# Patient Record
Sex: Female | Born: 1955 | Race: Black or African American | Hispanic: No | Marital: Married | Smoking: Former smoker
Health system: Southern US, Community
[De-identification: ages and names within clinical notes are randomized; demographics above are authoritative.]

## PROBLEM LIST (undated history)

## (undated) DIAGNOSIS — D219 Benign neoplasm of connective and other soft tissue, unspecified: Secondary | ICD-10-CM

## (undated) DIAGNOSIS — I10 Essential (primary) hypertension: Secondary | ICD-10-CM

## (undated) DIAGNOSIS — J329 Chronic sinusitis, unspecified: Secondary | ICD-10-CM

## (undated) DIAGNOSIS — K219 Gastro-esophageal reflux disease without esophagitis: Secondary | ICD-10-CM

## (undated) DIAGNOSIS — C801 Malignant (primary) neoplasm, unspecified: Secondary | ICD-10-CM

## (undated) HISTORY — PX: MYOMECTOMY: SHX85

---

## 1998-05-08 ENCOUNTER — Ambulatory Visit (HOSPITAL_COMMUNITY): Admission: RE | Admit: 1998-05-08 | Discharge: 1998-05-08 | Payer: Self-pay | Admitting: Obstetrics

## 1998-05-14 ENCOUNTER — Inpatient Hospital Stay (HOSPITAL_COMMUNITY): Admission: RE | Admit: 1998-05-14 | Discharge: 1998-05-17 | Payer: Self-pay | Admitting: Obstetrics

## 2003-01-09 ENCOUNTER — Emergency Department (HOSPITAL_COMMUNITY): Admission: EM | Admit: 2003-01-09 | Discharge: 2003-01-09 | Payer: Self-pay | Admitting: Emergency Medicine

## 2008-02-14 ENCOUNTER — Emergency Department (HOSPITAL_COMMUNITY): Admission: EM | Admit: 2008-02-14 | Discharge: 2008-02-14 | Payer: Self-pay | Admitting: Emergency Medicine

## 2016-06-10 ENCOUNTER — Ambulatory Visit (HOSPITAL_COMMUNITY)
Admission: EM | Admit: 2016-06-10 | Discharge: 2016-06-10 | Disposition: A | Discharge status: Home / Self Care | Payer: Self-pay | Attending: Internal Medicine | Admitting: Internal Medicine

## 2016-06-10 ENCOUNTER — Encounter (HOSPITAL_COMMUNITY): Payer: Self-pay | Admitting: Emergency Medicine

## 2016-06-10 DIAGNOSIS — R42 Dizziness and giddiness: Secondary | ICD-10-CM | POA: Insufficient documentation

## 2016-06-10 DIAGNOSIS — R030 Elevated blood-pressure reading, without diagnosis of hypertension: Secondary | ICD-10-CM

## 2016-06-10 LAB — CBC WITH DIFFERENTIAL/PLATELET
BASOS ABS: 0 10*3/uL (ref 0.0–0.1)
BASOS PCT: 0 %
EOS PCT: 2 %
Eosinophils Absolute: 0.2 10*3/uL (ref 0.0–0.7)
HEMATOCRIT: 41.3 % (ref 36.0–46.0)
Hemoglobin: 12.8 g/dL (ref 12.0–15.0)
Lymphocytes Relative: 28 %
Lymphs Abs: 2.7 10*3/uL (ref 0.7–4.0)
MCH: 22.4 pg — ABNORMAL LOW (ref 26.0–34.0)
MCHC: 31 g/dL (ref 30.0–36.0)
MCV: 72.2 fL — ABNORMAL LOW (ref 78.0–100.0)
MONO ABS: 0.6 10*3/uL (ref 0.1–1.0)
MONOS PCT: 7 %
NEUTROS ABS: 6.2 10*3/uL (ref 1.7–7.7)
Neutrophils Relative %: 63 %
PLATELETS: 290 10*3/uL (ref 150–400)
RBC: 5.72 MIL/uL — ABNORMAL HIGH (ref 3.87–5.11)
RDW: 15.4 % (ref 11.5–15.5)
WBC: 9.8 10*3/uL (ref 4.0–10.5)

## 2016-06-10 LAB — COMPREHENSIVE METABOLIC PANEL
ALBUMIN: 3.6 g/dL (ref 3.5–5.0)
ALT: 15 U/L (ref 14–54)
ANION GAP: 8 (ref 5–15)
AST: 21 U/L (ref 15–41)
Alkaline Phosphatase: 91 U/L (ref 38–126)
BILIRUBIN TOTAL: 0.3 mg/dL (ref 0.3–1.2)
BUN: 15 mg/dL (ref 6–20)
CHLORIDE: 106 mmol/L (ref 101–111)
CO2: 25 mmol/L (ref 22–32)
Calcium: 9.5 mg/dL (ref 8.9–10.3)
Creatinine, Ser: 1.22 mg/dL — ABNORMAL HIGH (ref 0.44–1.00)
GFR calc Af Amer: 55 mL/min — ABNORMAL LOW (ref >60)
GFR, EST NON AFRICAN AMERICAN: 48 mL/min — AB (ref >60)
GLUCOSE: 94 mg/dL (ref 65–99)
POTASSIUM: 4.3 mmol/L (ref 3.5–5.1)
Sodium: 139 mmol/L (ref 135–145)
TOTAL PROTEIN: 8.1 g/dL (ref 6.5–8.1)

## 2016-06-10 LAB — GLUCOSE, CAPILLARY: GLUCOSE-CAPILLARY: 108 mg/dL — AB (ref 65–99)

## 2016-06-10 NOTE — ED Triage Notes (Signed)
Pt here for menstrual problems onset 4 weeks .... Reports menstrual lasted for 4 weeks but it was not heavy bleeding that has now subsided  Also c/o LH onset 3 months  A&O x4... NAD

## 2016-06-10 NOTE — ED Provider Notes (Signed)
[facility]    CSN: [id number] Arrival date & time: 06/10/16  1710     History   Chief Complaint Chief Complaint  Patient presents with  . Dizziness  . Menstrual Problem    HPI Robyn Guerra is a 60 y.o. female. She presents today feeling intermittently light headed, "off balance." Had similar sx's a couple years ago while taking some otc supplements; this resolved when she stopped the supplements.  Not falling down.  Symptoms are brief, last a couple sec a couple times daily.   Also reports irregular menses, might skip a couple periods then spot for 3-4 weeks.  Has had this pattern for a couple years.   HPI  History reviewed. No pertinent past medical history.  History reviewed. No pertinent surgical history.   Home Medications        MVI, Tums, biotin, cinnamon, iron   Family History Diabetes  Social History Social History  Substance Use Topics  . Smoking status: Never Smoker  . Smokeless tobacco: Never Used  . Alcohol use No     Allergies   Review of patient's allergies indicates no known allergies.   Review of Systems Review of Systems  All other systems reviewed and are negative.    Physical Exam Triage Vital Signs ED Triage Vitals [06/10/16 1825]  Enc Vitals Group     BP 175/81     Pulse Rate 82     Resp 14     Temp 98.4 F (36.9 C)     Temp Source Oral     SpO2 100 %     Weight      Height    Orthostatic VS for the past 24 hrs:  BP- Lying Pulse- Lying BP- Sitting Pulse- Sitting BP- Standing at 0 minutes Pulse- Standing at 0 minutes  06/10/16 1925 180/81 80 (!) 172/117 81 (!) 177/111 89    Updated Vital Signs BP 175/81 (BP Location: Left Arm)   Pulse 82   Temp 98.4 F (36.9 C) (Oral)   Resp 14   SpO2 100%  Physical Exam  Constitutional: She is oriented to person, place, and time. No distress.  Alert, nicely groomed  HENT:  Head: Atraumatic.  Eyes:  Conjugate gaze, no eye redness/drainage  Neck: Neck supple.    Cardiovascular: Normal rate and regular rhythm.   Pulmonary/Chest: No respiratory distress. She has no wheezes. She has no rales.  Lungs clear, symmetric breath sounds  Abdominal: She exhibits no distension.  Musculoskeletal: Normal range of motion.  No leg swelling  Neurological: She is alert and oriented to person, place, and time.  Skin: Skin is warm and dry.  No cyanosis  Nursing note and vitals reviewed.    UC Treatments / Results  Labs  Results for orders placed or performed during the hospital encounter of 06/10/16  Glucose, capillary  Result Value Ref Range   Glucose-Capillary 108 (H) 65 - 99 mg/dL  CBC with Differential  Result Value Ref Range   WBC 9.8 4.0 - 10.5 K/uL   RBC 5.72 (H) 3.87 - 5.11 MIL/uL   Hemoglobin 12.8 12.0 - 15.0 g/dL   HCT 41.3 36.0 - 46.0 %   MCV 72.2 (L) 78.0 - 100.0 fL   MCH 22.4 (L) 26.0 - 34.0 pg   MCHC 31.0 30.0 - 36.0 g/dL   RDW 15.4 11.5 - 15.5 %   Platelets 290 150 - 400 K/uL   Neutrophils Relative % 63 %   Neutro Abs 6.2  1.7 - 7.7 K/uL   Lymphocytes Relative 28 %   Lymphs Abs 2.7 0.7 - 4.0 K/uL   Monocytes Relative 7 %   Monocytes Absolute 0.6 0.1 - 1.0 K/uL   Eosinophils Relative 2 %   Eosinophils Absolute 0.2 0.0 - 0.7 K/uL   Basophils Relative 0 %   Basophils Absolute 0.0 0.0 - 0.1 K/uL  Comprehensive metabolic panel  Result Value Ref Range   Sodium 139 135 - 145 mmol/L   Potassium 4.3 3.5 - 5.1 mmol/L   Chloride 106 101 - 111 mmol/L   CO2 25 22 - 32 mmol/L   Glucose, Bld 94 65 - 99 mg/dL   BUN 15 6 - 20 mg/dL   Creatinine, Ser 1.22 (H) 0.44 - 1.00 mg/dL   Calcium 9.5 8.9 - 10.3 mg/dL   Total Protein 8.1 6.5 - 8.1 g/dL   Albumin 3.6 3.5 - 5.0 g/dL   AST 21 15 - 41 U/L   ALT 15 14 - 54 U/L   Alkaline Phosphatase 91 38 - 126 U/L   Total Bilirubin 0.3 0.3 - 1.2 mg/dL   GFR calc non Af Amer 48 (L) >60 mL/min   GFR calc Af Amer 55 (L) >60 mL/min   Anion gap 8 5 - 15     EKG NSR, rate 78 No acute ST or T wave  changes, no ectopy PR 130 msec QRS 86 msec QTc 424 msec R axis 22   Procedures Procedures (including critical care time)      None today  Final Clinical Impressions(s) / UC Diagnoses   Final diagnoses:  Lightheadedness  Elevated blood pressure reading without diagnosis of hypertension   The cause of your lightheadedness is not clear.  Your blood pressure was elevated today and should be rechecked in a few weeks with a primary care provider to see if this is a pattern.  Blood work is pending.  The urgent care will contact you if the blood work is abnormal, or you can view the lab results in the MyChart App (sign up instructions in this discharge paper).  ECG did not show a heart rhythm disturbance or heart attack.       Sherlene Shams, MD 06/17/16 2037

## 2016-06-10 NOTE — Discharge Instructions (Addendum)
The cause of your lightheadedness is not clear.  Your blood pressure was elevated today and should be rechecked in a few weeks with a primary care provider to see if this is a pattern.  Blood work is pending.  The urgent care will contact you if the blood work is abnormal, or you can view the lab results in the MyChart App (sign up instructions in this discharge paper).  ECG did not show a heart rhythm disturbance or heart attack.

## 2016-06-14 ENCOUNTER — Telehealth (HOSPITAL_COMMUNITY): Payer: Self-pay | Admitting: Emergency Medicine

## 2016-06-14 NOTE — Telephone Encounter (Signed)
-----   Message from Sherlene Shams, MD sent at 06/11/2016  5:46 PM EDT ----- Please let patient know that creatinine (kidney number) was very slightly elevated.   This is sometimes related to high blood pressure.  Kidney function should be rechecked by a primary care provider in the next few weeks. Patient is to followup with a PCP to recheck blood pressure as well.  LM

## 2016-06-14 NOTE — Telephone Encounter (Signed)
Called pt and notified of recent lab results from visit Pt ID'd properly... Reports intermittent sx Adv pt if sx are not getting better to return or to f/u w/PCP States she is still looking for PCP Pt verb understanding.

## 2016-09-28 ENCOUNTER — Other Ambulatory Visit: Payer: Self-pay | Admitting: Family Medicine

## 2016-09-28 DIAGNOSIS — Z1231 Encounter for screening mammogram for malignant neoplasm of breast: Secondary | ICD-10-CM

## 2016-09-28 DIAGNOSIS — N926 Irregular menstruation, unspecified: Secondary | ICD-10-CM

## 2016-10-01 ENCOUNTER — Other Ambulatory Visit: Payer: Self-pay

## 2016-10-01 ENCOUNTER — Ambulatory Visit
Admission: RE | Admit: 2016-10-01 | Discharge: 2016-10-01 | Disposition: A | Discharge status: Home / Self Care | Payer: BLUE CROSS/BLUE SHIELD | Source: Ambulatory Visit | Attending: Family Medicine | Admitting: Family Medicine

## 2016-10-01 DIAGNOSIS — N926 Irregular menstruation, unspecified: Secondary | ICD-10-CM

## 2016-10-04 ENCOUNTER — Other Ambulatory Visit: Payer: Self-pay

## 2016-10-07 ENCOUNTER — Ambulatory Visit: Payer: Self-pay

## 2016-10-12 ENCOUNTER — Ambulatory Visit: Payer: Self-pay

## 2016-10-12 ENCOUNTER — Ambulatory Visit
Admission: RE | Admit: 2016-10-12 | Discharge: 2016-10-12 | Disposition: A | Discharge status: Home / Self Care | Payer: BLUE CROSS/BLUE SHIELD | Source: Ambulatory Visit | Attending: Family Medicine | Admitting: Family Medicine

## 2016-10-12 DIAGNOSIS — Z1231 Encounter for screening mammogram for malignant neoplasm of breast: Secondary | ICD-10-CM

## 2016-10-14 ENCOUNTER — Other Ambulatory Visit: Payer: Self-pay | Admitting: Family Medicine

## 2016-10-14 DIAGNOSIS — R928 Other abnormal and inconclusive findings on diagnostic imaging of breast: Secondary | ICD-10-CM

## 2016-10-22 ENCOUNTER — Ambulatory Visit
Admission: RE | Admit: 2016-10-22 | Discharge: 2016-10-22 | Disposition: A | Discharge status: Home / Self Care | Payer: BLUE CROSS/BLUE SHIELD | Source: Ambulatory Visit | Attending: Family Medicine | Admitting: Family Medicine

## 2016-10-22 ENCOUNTER — Other Ambulatory Visit: Payer: Self-pay | Admitting: Family Medicine

## 2016-10-22 DIAGNOSIS — R928 Other abnormal and inconclusive findings on diagnostic imaging of breast: Secondary | ICD-10-CM

## 2016-10-28 ENCOUNTER — Other Ambulatory Visit: Payer: Self-pay | Admitting: Family Medicine

## 2016-10-28 DIAGNOSIS — M898X8 Other specified disorders of bone, other site: Secondary | ICD-10-CM

## 2016-11-05 ENCOUNTER — Inpatient Hospital Stay: Admission: RE | Admit: 2016-11-05 | Payer: BLUE CROSS/BLUE SHIELD | Source: Ambulatory Visit

## 2016-11-12 ENCOUNTER — Ambulatory Visit
Admission: RE | Admit: 2016-11-12 | Discharge: 2016-11-12 | Disposition: A | Discharge status: Home / Self Care | Payer: BLUE CROSS/BLUE SHIELD | Source: Ambulatory Visit | Attending: Family Medicine | Admitting: Family Medicine

## 2016-11-12 DIAGNOSIS — M898X8 Other specified disorders of bone, other site: Secondary | ICD-10-CM

## 2017-04-24 IMAGING — US US PELVIS COMPLETE
1 series · 13 of 25 positions shown · non-contrast
Comparison: None

CLINICAL DATA: Chronic dysfunctional uterine bleeding. Initial
encounter.

EXAM:
TRANSABDOMINAL AND TRANSVAGINAL ULTRASOUND OF PELVIS
TECHNIQUE: Both transabdominal and transvaginal ultrasound examinations of the
pelvis were performed. Transabdominal technique was performed for
global imaging of the pelvis including uterus, adnexal regions, and
pelvic cul-de-sac. It was necessary to proceed with endovaginal exam
following the transabdominal exam to visualize the uterus and adnexa
in greater detail.

[Series 1: us pelvis complete · 0.28mm/px · 13 of 50 slices shown]
[im 1/50]
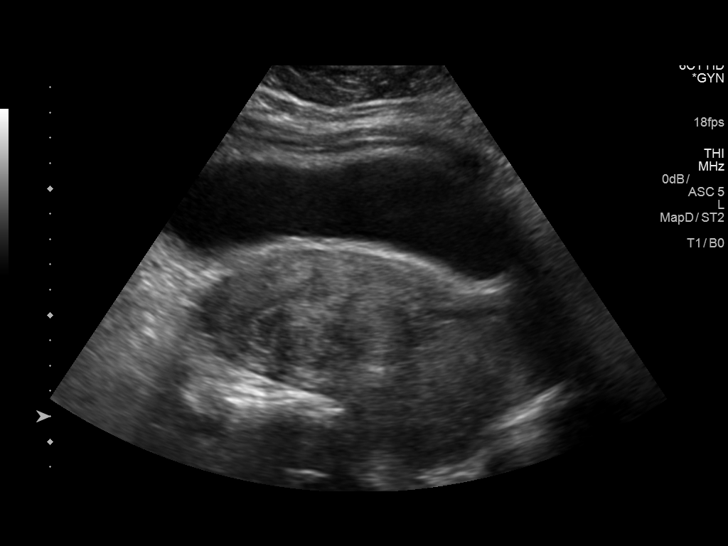
[im 5/50]
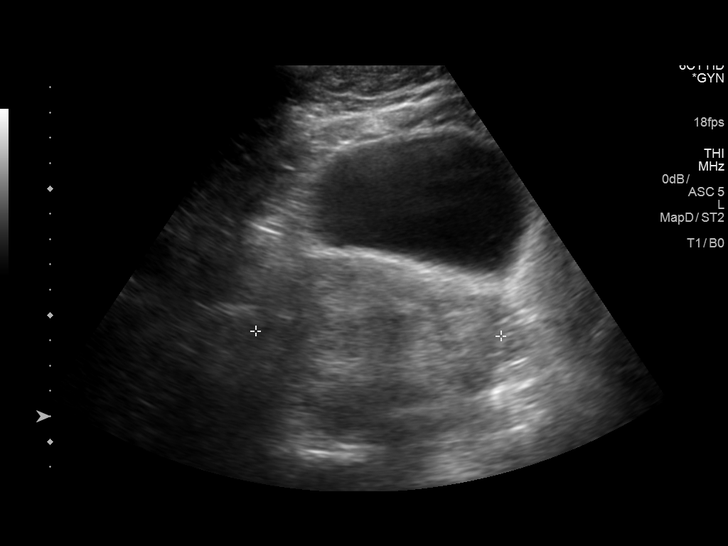
[im 9/50]
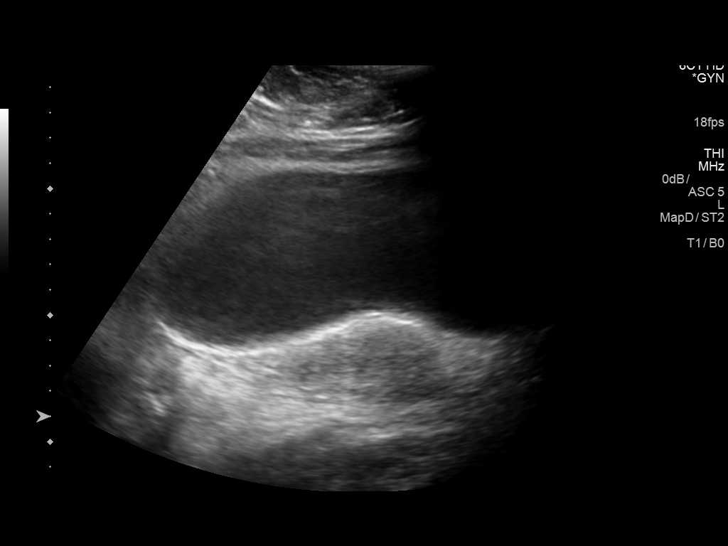
[im 13/50]
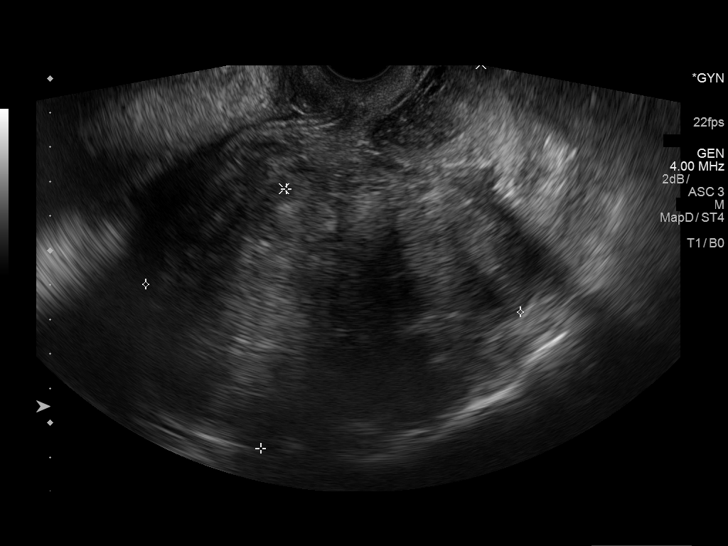
[im 17/50]
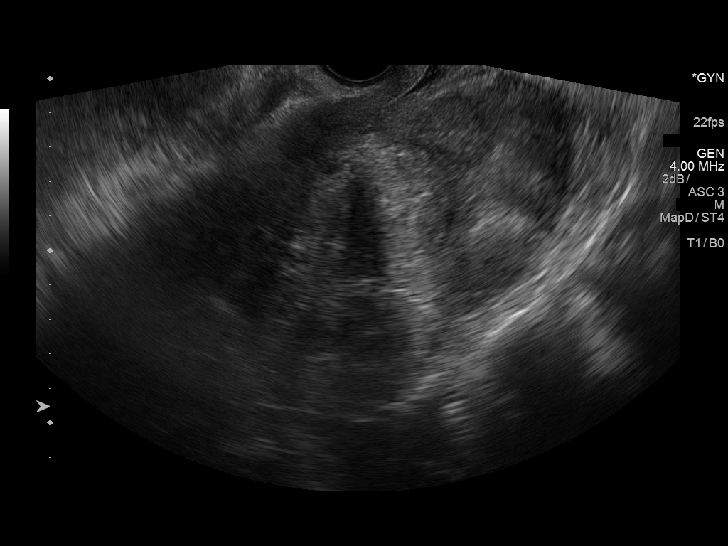
[im 21/50]
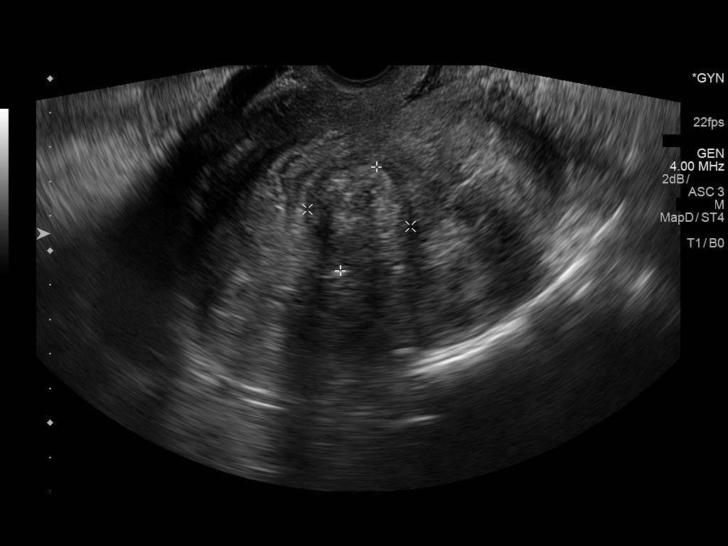
[im 25/50]
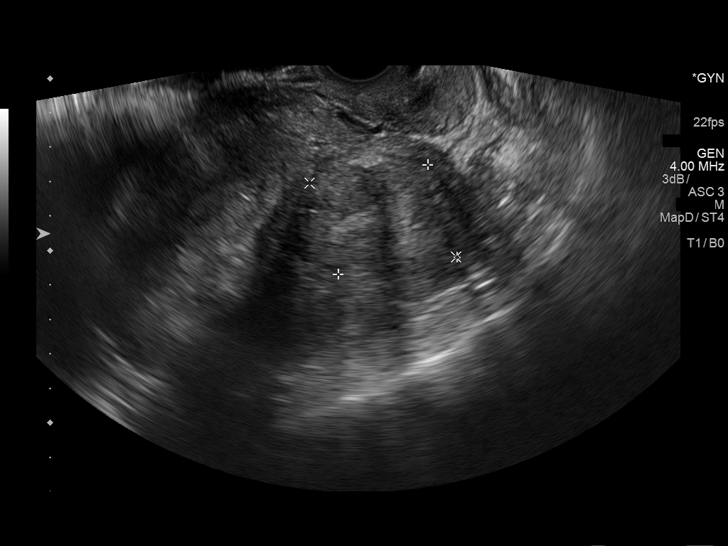
[im 29/50]
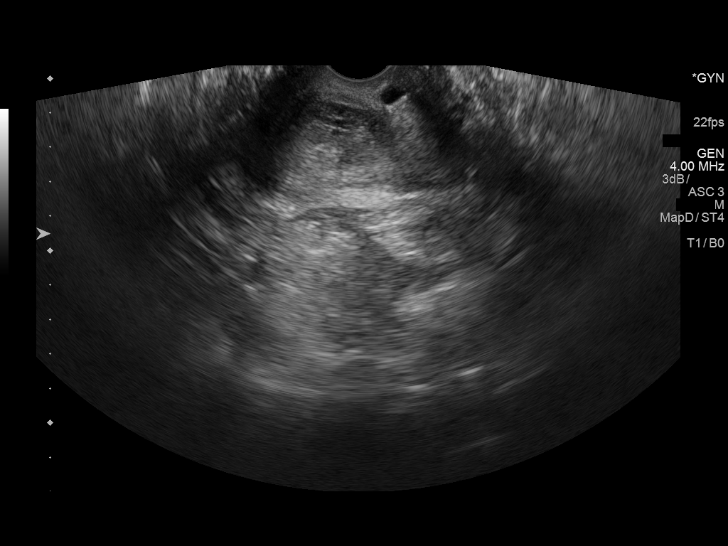
[im 33/50]
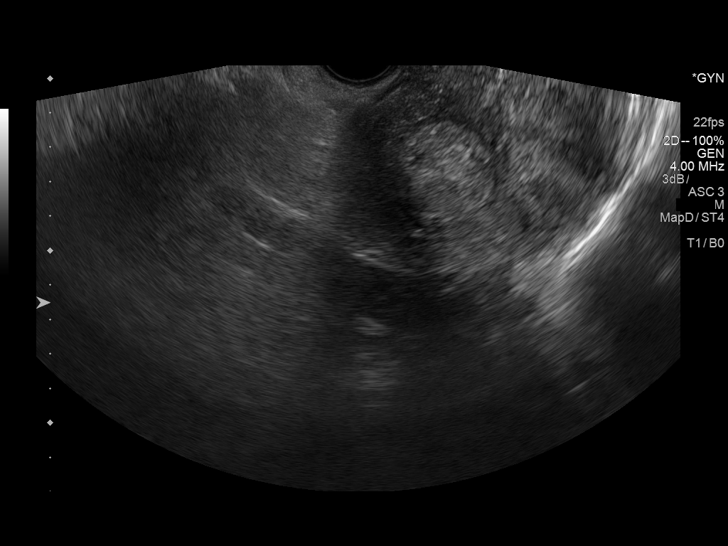
[im 37/50]
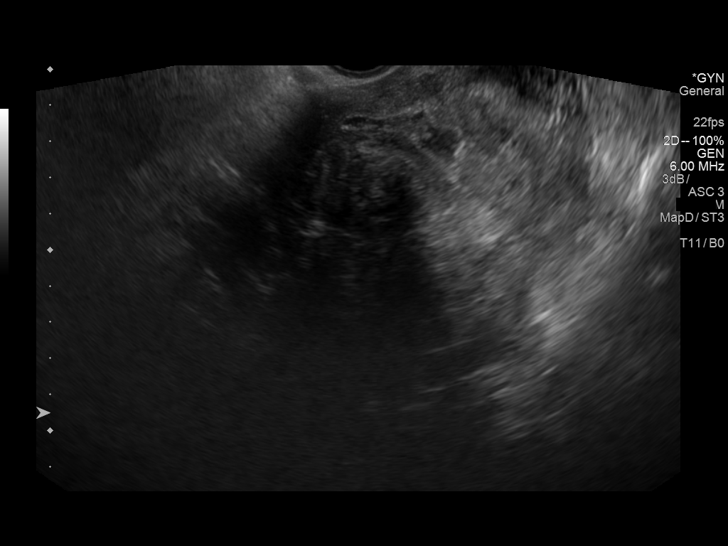
[im 41/50]
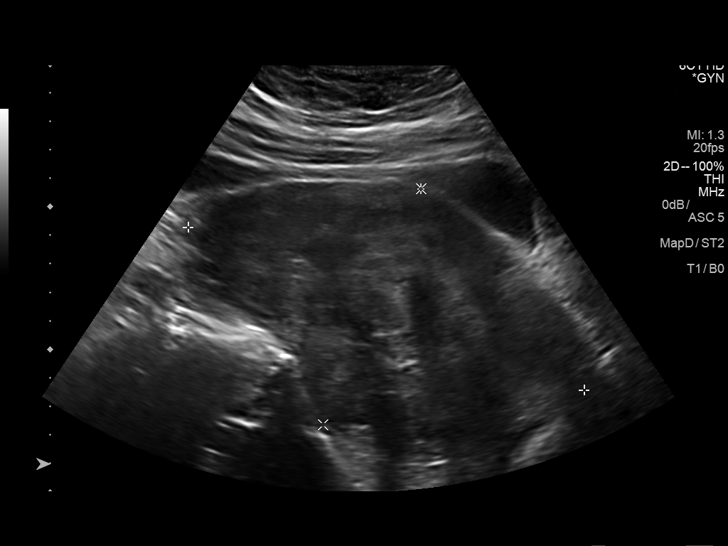
[im 45/50]
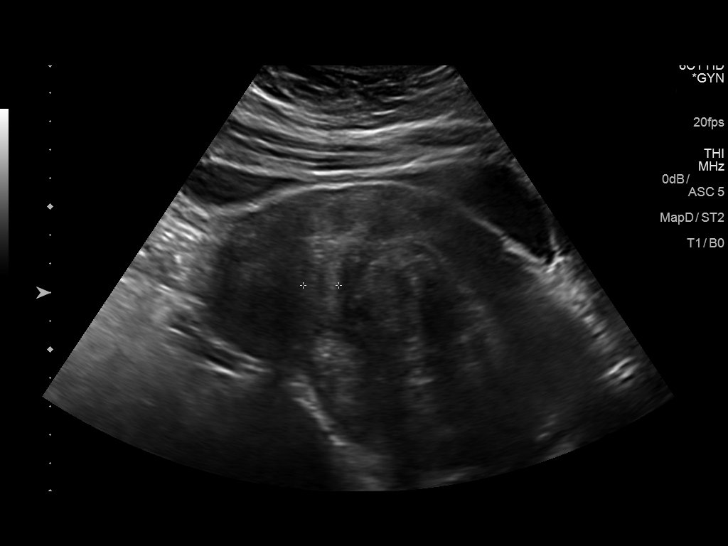
[im 50/50]
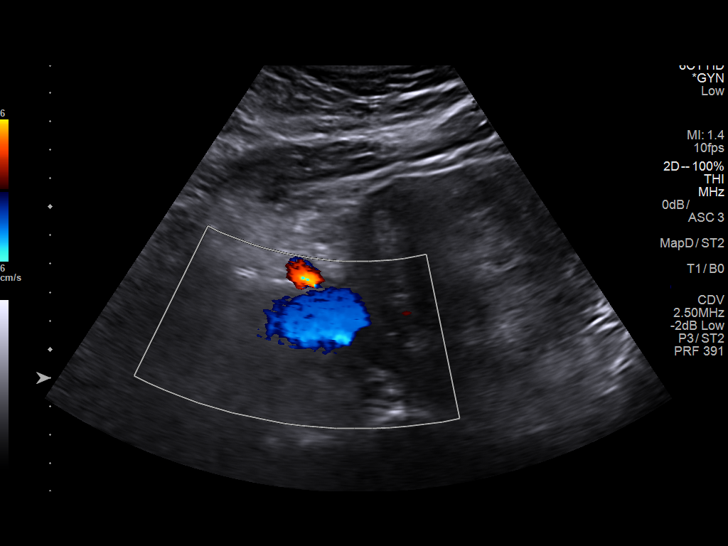

[13 of 25 positions shown; findings below may reference images not displayed]

FINDINGS: Uterus

Measurements: 14.4 x 5.8 x 9.7 cm. There appears to be a 3.8 x 3.2 x
3.0 cm right posterior submucosal fibroid abutting the endometrial
canal. A slightly larger 4.8 x 4.1 x 3.0 cm left posterior
subserosal fibroid is also seen. The uterus is retroverted in
nature.

Endometrium

Thickness: 1.8 cm. Vague debris and trace fluid are seen within the
lower uterine segment and cervix.

Right ovary

Not visualized.

Left ovary

Not visualized.

Other findings

No abnormal free fluid.
IMPRESSION: 1. Vague debris and trace fluid within the lower uterine segment and
cervix, with adjacent 3.8 cm right posterior submucosal fibroid
abutting the endometrial canal. Evaluation is suboptimal due to the
patient's habitus.
2. Mildly larger 4.8 cm left posterior subserosal fibroid also
noted. Uterus retroverted in nature.
3. Ovaries not visualized on this study.

## 2017-06-23 ENCOUNTER — Ambulatory Visit: Payer: BLUE CROSS/BLUE SHIELD | Admitting: Obstetrics

## 2017-08-14 ENCOUNTER — Other Ambulatory Visit: Payer: Self-pay

## 2017-08-14 ENCOUNTER — Encounter (HOSPITAL_COMMUNITY): Payer: Self-pay | Admitting: *Deleted

## 2017-08-14 ENCOUNTER — Inpatient Hospital Stay (HOSPITAL_COMMUNITY)
Admission: AD | Admit: 2017-08-14 | Discharge: 2017-08-14 | Disposition: A | Discharge status: Home / Self Care | Payer: BLUE CROSS/BLUE SHIELD | Source: Ambulatory Visit | Attending: Obstetrics and Gynecology | Admitting: Obstetrics and Gynecology

## 2017-08-14 DIAGNOSIS — R102 Pelvic and perineal pain: Secondary | ICD-10-CM | POA: Diagnosis not present

## 2017-08-14 DIAGNOSIS — N924 Excessive bleeding in the premenopausal period: Secondary | ICD-10-CM | POA: Insufficient documentation

## 2017-08-14 DIAGNOSIS — N939 Abnormal uterine and vaginal bleeding, unspecified: Secondary | ICD-10-CM

## 2017-08-14 HISTORY — DX: Benign neoplasm of connective and other soft tissue, unspecified: D21.9

## 2017-08-14 HISTORY — DX: Essential (primary) hypertension: I10

## 2017-08-14 LAB — CBC
HEMATOCRIT: 36.9 % (ref 36.0–46.0)
HEMOGLOBIN: 11.8 g/dL — AB (ref 12.0–15.0)
MCH: 22.2 pg — ABNORMAL LOW (ref 26.0–34.0)
MCHC: 32 g/dL (ref 30.0–36.0)
MCV: 69.4 fL — AB (ref 78.0–100.0)
Platelets: 358 10*3/uL (ref 150–400)
RBC: 5.32 MIL/uL — AB (ref 3.87–5.11)
RDW: 15.5 % (ref 11.5–15.5)
WBC: 11.7 10*3/uL — AB (ref 4.0–10.5)

## 2017-08-14 MED ORDER — MEGESTROL ACETATE 40 MG PO TABS: 40.0000 mg | ORAL_TABLET | Freq: Two times a day (BID) | ORAL | 5 refills | Status: DC

## 2017-08-14 MED ORDER — IBUPROFEN 600 MG PO TABS: 600.0000 mg | ORAL_TABLET | Freq: Four times a day (QID) | ORAL | 1 refills | Status: DC | PRN

## 2017-08-14 MED ORDER — KETOROLAC TROMETHAMINE 60 MG/2ML IM SOLN
60.0000 mg | Freq: Once | INTRAMUSCULAR | Status: AC
  Administered 2017-08-14: 60 mg via INTRAMUSCULAR
  Filled 2017-08-14: qty 2

## 2017-08-14 MED ORDER — KETOROLAC TROMETHAMINE 60 MG/2ML IM SOLN: 60.0000 mg | Freq: Once | INTRAMUSCULAR | Status: DC

## 2017-08-14 NOTE — MAU Provider Note (Signed)
Chief Complaint:  Vaginal Bleeding and Vaginal Pain   First Provider Initiated Contact with Patient 08/14/17 0102     HPI: Robyn Guerra is a 61 y.o. G3P0030 who presents to maternity admissions reporting heavy vaginal bleeding for 16 months.   Also having cramping.  Feels lightheaded occasionally.  . She reports vaginal bleeding, but no vaginal itching/burning, urinary symptoms, h/a, dizziness, n/v, or fever/chills.    Has been followed by her family doctor, who has only done an ultrasound "but they can't find the results".  Has not seen a GYN or had an endometrial biopsy.  Thinks she might be anemic, but not sure. States has not every gone a year without periods.  Has had some months skipped but never went a whole year.  Vaginal Bleeding  The patient's primary symptoms include pelvic pain and vaginal bleeding. The patient's pertinent negatives include no genital itching, genital lesions or genital odor. This is a recurrent problem. The current episode started more than 1 year ago. The problem occurs constantly. The problem has been unchanged. The pain is moderate. The problem affects both sides. She is not pregnant. Associated symptoms include abdominal pain. Pertinent negatives include no chills, constipation, diarrhea, dysuria, fever, nausea, painful intercourse or vomiting. The vaginal discharge was bloody. The vaginal bleeding is heavier than menses. She has been passing clots. She has not been passing tissue. Nothing aggravates the symptoms. She has tried NSAIDs for the symptoms. The treatment provided mild relief. It is unknown whether or not her partner has an STD. Her menstrual history has been irregular.  Vaginal Pain  The patient's primary symptoms include pelvic pain and vaginal bleeding. The patient's pertinent negatives include no genital itching, genital lesions or genital odor. The pain is moderate. She is not pregnant. Associated symptoms include abdominal pain. Pertinent negatives  include no chills, constipation, diarrhea, dysuria, fever, nausea, painful intercourse or vomiting. The vaginal discharge was bloody. The vaginal bleeding is heavier than menses. She has tried NSAIDs and acetaminophen for the symptoms. The treatment provided moderate relief.    RN note: Having vag bleeding for 16 months. Having abdominal pain intermittently for a month. Have been going to doctor but unsure of results of ultrasound. Unsure if they could see ovaries. Now having pain in lower abd and  all the time if I do not take pain med. Passing clots sometimes and lightheaded    Past Medical History: Past Medical History:  Diagnosis Date  . Fibroids   . Hypertension     Past obstetric history: OB History  Gravida Para Term Preterm AB Living  3       3 0  SAB TAB Ectopic Multiple Live Births               # Outcome Date GA Lbr Len/2nd Weight Sex Delivery Anes PTL Lv  3 AB           2 AB           1 AB               Past Surgical History: Past Surgical History:  Procedure Laterality Date  . MYOMECTOMY      Family History: History reviewed. No pertinent family history.  Social History: Social History   Tobacco Use  . Smoking status: Never Smoker  . Smokeless tobacco: Never Used  Substance Use Topics  . Alcohol use: No  . Drug use: Not on file    Allergies:  Allergies  Allergen Reactions  .  Codeine Nausea And Vomiting    Room spins and causes n/v    Meds:  Medications Prior to Admission  Medication Sig Dispense Refill Last Dose  . acetaminophen (TYLENOL) 500 MG tablet Take 500 mg by mouth every 6 (six) hours as needed.   08/13/2017 at Unknown time  . benazepril-hydrochlorthiazide (LOTENSIN HCT) 10-12.5 MG tablet Take 1 tablet by mouth daily.   08/13/2017 at Unknown time  . ibuprofen (ADVIL,MOTRIN) 200 MG tablet Take 200 mg by mouth every 6 (six) hours as needed.   Past Month at Unknown time  . naproxen sodium (ALEVE) 220 MG tablet Take 220 mg by mouth.   Past  Week at Unknown time    I have reviewed patient's Past Medical Hx, Surgical Hx, Family Hx, Social Hx, medications and allergies.  ROS:  Review of Systems  Constitutional: Negative for chills and fever.  Gastrointestinal: Positive for abdominal pain. Negative for constipation, diarrhea, nausea and vomiting.  Genitourinary: Positive for pelvic pain, vaginal bleeding and vaginal pain. Negative for dysuria.   Other systems negative     Physical Exam   Patient Vitals for the past 24 hrs:  BP Temp Pulse Resp Height Weight  08/14/17 0047 (!) 179/82 98.1 F (36.7 C) 86 18 5' 4.5" (1.638 m) 271 lb (122.9 kg)   Constitutional: Well-developed, well-nourished female in no acute distress.  Cardiovascular: normal rate and rhythm Respiratory: normal effort, no distress. Lungs CTAB with no wheezes or crackles GI: Abd soft, non-tender.  Nondistended.  No rebound, No guarding.   MS: Extremities nontender, no edema, normal ROM Neurologic: Alert and oriented x 4.   Grossly nonfocal. GU: Neg CVAT. Skin:  Warm and Dry Psych:  Affect appropriate.  PELVIC EXAM: Cervix pink, visually closed, without lesion, small amount of dark red blood, vaginal walls and external genitalia normal     No blood on pad Bimanual exam: Cervix firm, anterior, neg CMT, uterus nontender, nonenlarged, adnexa without tenderness, enlargement, or mass    Labs: Results for orders placed or performed during the hospital encounter of 08/14/17 (from the past 24 hour(s))  CBC     Status: Abnormal   Collection Time: 08/14/17  1:09 AM  Result Value Ref Range   WBC 11.7 (H) 4.0 - 10.5 K/uL   RBC 5.32 (H) 3.87 - 5.11 MIL/uL   Hemoglobin 11.8 (L) 12.0 - 15.0 g/dL   HCT 36.9 36.0 - 46.0 %   MCV 69.4 (L) 78.0 - 100.0 fL   MCH 22.2 (L) 26.0 - 34.0 pg   MCHC 32.0 30.0 - 36.0 g/dL   RDW 15.5 11.5 - 15.5 %   Platelets 358 150 - 400 K/uL     Hgb = 12.8 in 2017  Imaging:  No results found.  MAU Course/MDM: I have ordered labs as  follows:  CBC, which showed good hemoglobin level, not much lower than last year.   Imaging ordered: Outpatient pelvic ultrasound Results reviewed.  Treatments in MAU included Toradol for pain. Discussed need for further evaluation given that she is 61 and still having abnormal bleeding after 16 months.  Definitely needs endometrial biopsy to rule out dysplasia/cancer cells.   Will start on Megace to help with bleeding and stabilize endometrium until we can get her into clinic Message sent to clinic for appt..   Pt stable at time of discharge.  Assessment: Abnormal uterine bleeding - Plan: Discharge patient, US PELVIS (TRANSABDOMINAL ONLY), US PELVIS TRANSVANGINAL NON-OB (TV ONLY)  Abnormal perimenopausal bleeding - Plan: Discharge  patient, US PELVIS (TRANSABDOMINAL ONLY), US PELVIS TRANSVANGINAL NON-OB (TV ONLY)    Plan: Discharge home Recommend Iron 1-2 times daily.  Plenty of fluids Follow up in clinic for biopsy and plan  Rx Megace bid for bleeding Rx sent for ibuprofen for cramping  Encouraged to return here or to other Urgent Care/ED if she develops worsening of symptoms, increase in pain, fever, or other concerning symptoms.   Hansel Feinstein CNM, MSN Certified Nurse-Midwife 08/14/2017 1:34 AM

## 2017-08-14 NOTE — MAU Note (Addendum)
Having vag bleeding for 16 months. Having abdominal pain intermittently for a month. Have been going to doctor but unsure of results of ultrasound. Unsure if they could see ovaries. Now having pain in lower abd and  all the time if I do not take pain med. Passing clots sometimes and lightheaded

## 2017-08-14 NOTE — Progress Notes (Signed)
Hansel Feinstein CNM in to discuss test results and d/c plan. Written and verbal d/c instructions given and understanding voiced. Pt understands to wait 84mins after pain med before leaving

## 2017-08-14 NOTE — Discharge Instructions (Signed)
Abnormal Uterine Bleeding Abnormal uterine bleeding can affect women at various stages in life, including teenagers, women in their reproductive years, pregnant women, and women who have reached menopause. Several kinds of uterine bleeding are considered abnormal, including:  Bleeding or spotting between periods.  Bleeding after sexual intercourse.  Bleeding that is heavier or more than normal.  Periods that last longer than usual.  Bleeding after menopause.  Many cases of abnormal uterine bleeding are minor and simple to treat, while others are more serious. Any type of abnormal bleeding should be evaluated by your health care provider. Treatment will depend on the cause of the bleeding. Follow these instructions at home: Monitor your condition for any changes. The following actions may help to alleviate any discomfort you are experiencing:  Avoid the use of tampons and douches as directed by your health care provider.  Change your pads frequently.  You should get regular pelvic exams and Pap tests. Keep all follow-up appointments for diagnostic tests as directed by your health care provider. Contact a health care provider if:  Your bleeding lasts more than 1 week.  You feel dizzy at times. Get help right away if:  You pass out.  You are changing pads every 15 to 30 minutes.  You have abdominal pain.  You have a fever.  You become sweaty or weak.  You are passing large blood clots from the vagina.  You start to feel nauseous and vomit. This information is not intended to replace advice given to you by your health care provider. Make sure you discuss any questions you have with your health care provider. Document Released: 08/23/2005 Document Revised: 02/04/2016 Document Reviewed: 03/22/2013 Elsevier Interactive Patient Education  2017 [employer] [place] is the time when your body begins to move into the menopause (no menstrual period for 12  straight months). It is a natural process. Perimenopause can begin 2-8 years before the menopause and usually lasts for 1 year after the menopause. During this time, your ovaries may or may not produce an egg. The ovaries vary in their production of estrogen and progesterone hormones each month. This can cause irregular menstrual periods, difficulty getting pregnant, vaginal bleeding between periods, and uncomfortable symptoms. What are the causes?  Irregular production of the ovarian hormones, estrogen and progesterone, and not ovulating every month. Other causes include:  Tumor of the pituitary gland in the brain.  Medical disease that affects the ovaries.  Radiation treatment.  Chemotherapy.  Unknown causes.  Heavy smoking and excessive alcohol intake can bring on perimenopause sooner.  What are the signs or symptoms?  Hot flashes.  Night sweats.  Irregular menstrual periods.  Decreased sex drive.  Vaginal dryness.  Headaches.  Mood swings.  Depression.  Memory problems.  Irritability.  Tiredness.  Weight gain.  Trouble getting pregnant.  The beginning of losing bone cells (osteoporosis).  The beginning of hardening of the arteries (atherosclerosis). How is this diagnosed? Your health care provider will make a diagnosis by analyzing your age, menstrual history, and symptoms. He or she will do a physical exam and note any changes in your body, especially your female organs. Female hormone tests may or may not be helpful depending on the amount of female hormones you produce and when you produce them. However, other hormone tests may be helpful to rule out other problems. How is this treated? In some cases, no treatment is needed. The decision on whether treatment is necessary during the perimenopause should be made by you  and your health care provider based on how the symptoms are affecting you and your lifestyle. Various treatments are available, such  as:  Treating individual symptoms with a specific medicine for that symptom.  Herbal medicines that can help specific symptoms.  Counseling.  Group therapy.  Follow these instructions at home:  Keep track of your menstrual periods (when they occur, how heavy they are, how long between periods, and how long they last) as well as your symptoms and when they started.  Only take over-the-counter or prescription medicines as directed by your health care provider.  Sleep and rest.  Exercise.  Eat a diet that contains calcium (good for your bones) and soy (acts like the estrogen hormone).  Do not smoke.  Avoid alcoholic beverages.  Take vitamin supplements as recommended by your health care provider. Taking vitamin E may help in certain cases.  Take calcium and vitamin D supplements to help prevent bone loss.  Group therapy is sometimes helpful.  Acupuncture may help in some cases. Contact a health care provider if:  You have questions about any symptoms you are having.  You need a referral to a specialist (gynecologist, psychiatrist, or psychologist). Get help right away if:  You have vaginal bleeding.  Your period lasts longer than 8 days.  Your periods are recurring sooner than 21 days.  You have bleeding after intercourse.  You have severe depression.  You have pain when you urinate.  You have severe headaches.  You have vision problems. This information is not intended to replace advice given to you by your health care provider. Make sure you discuss any questions you have with your health care provider. Document Released: 09/30/2004 Document Revised: 01/29/2016 Document Reviewed: 03/22/2013 Elsevier Interactive Patient Education  2017 [employer].

## 2017-08-19 ENCOUNTER — Ambulatory Visit (HOSPITAL_COMMUNITY)
Admission: RE | Admit: 2017-08-19 | Discharge: 2017-08-19 | Disposition: A | Discharge status: Home / Self Care | Payer: BLUE CROSS/BLUE SHIELD | Source: Ambulatory Visit | Attending: Advanced Practice Midwife | Admitting: Advanced Practice Midwife

## 2017-08-19 DIAGNOSIS — N939 Abnormal uterine and vaginal bleeding, unspecified: Secondary | ICD-10-CM

## 2017-08-19 DIAGNOSIS — D250 Submucous leiomyoma of uterus: Secondary | ICD-10-CM | POA: Diagnosis not present

## 2017-08-19 DIAGNOSIS — N924 Excessive bleeding in the premenopausal period: Secondary | ICD-10-CM | POA: Insufficient documentation

## 2017-08-21 ENCOUNTER — Encounter: Payer: Self-pay | Admitting: Advanced Practice Midwife

## 2017-08-21 DIAGNOSIS — N924 Excessive bleeding in the premenopausal period: Secondary | ICD-10-CM | POA: Insufficient documentation

## 2017-08-21 DIAGNOSIS — N939 Abnormal uterine and vaginal bleeding, unspecified: Secondary | ICD-10-CM | POA: Insufficient documentation

## 2017-09-12 ENCOUNTER — Telehealth: Payer: Self-pay | Admitting: General Practice

## 2017-09-12 NOTE — Telephone Encounter (Signed)
Patient called into front office concerned about u/s results. Reviewed results with patient and that the doctor at her appt on the 21st will go over everything in more depth & talk about treatment options. Patient verbalized understanding to all & was grateful. Patient had no questions

## 2017-09-26 ENCOUNTER — Encounter: Payer: Self-pay | Admitting: Obstetrics & Gynecology

## 2017-09-26 ENCOUNTER — Telehealth: Payer: Self-pay | Admitting: General Practice

## 2017-09-26 ENCOUNTER — Ambulatory Visit: Payer: BLUE CROSS/BLUE SHIELD | Admitting: Obstetrics & Gynecology

## 2017-09-26 VITALS — BP 156/95 | HR 91 | Ht 64.0 in | Wt 272.9 lb

## 2017-09-26 DIAGNOSIS — Z1231 Encounter for screening mammogram for malignant neoplasm of breast: Secondary | ICD-10-CM | POA: Diagnosis not present

## 2017-09-26 DIAGNOSIS — D219 Benign neoplasm of connective and other soft tissue, unspecified: Secondary | ICD-10-CM

## 2017-09-26 DIAGNOSIS — N939 Abnormal uterine and vaginal bleeding, unspecified: Secondary | ICD-10-CM

## 2017-09-26 DIAGNOSIS — Z1151 Encounter for screening for human papillomavirus (HPV): Secondary | ICD-10-CM | POA: Diagnosis not present

## 2017-09-26 DIAGNOSIS — Z124 Encounter for screening for malignant neoplasm of cervix: Secondary | ICD-10-CM | POA: Diagnosis not present

## 2017-09-26 DIAGNOSIS — Z1239 Encounter for other screening for malignant neoplasm of breast: Secondary | ICD-10-CM

## 2017-09-26 DIAGNOSIS — I10 Essential (primary) hypertension: Secondary | ICD-10-CM | POA: Diagnosis not present

## 2017-09-26 MED ORDER — MISOPROSTOL 200 MCG PO TABS
ORAL_TABLET | ORAL | 0 refills | Status: DC
Start: 2017-09-26 — End: 2017-11-01

## 2017-09-26 MED ORDER — HYDROCHLOROTHIAZIDE 25 MG PO TABS: 25.0000 mg | ORAL_TABLET | Freq: Every day | ORAL | 2 refills | Status: AC

## 2017-09-26 NOTE — Patient Instructions (Addendum)
Hysterectomy Information A hysterectomy is a surgery to remove your uterus. After surgery, you will no longer have periods. Also, you will not be able to get pregnant. Reasons for this surgery  You have bleeding that is not normal and keeps coming back.  You have lasting (chronic) lower belly (pelvic) pain.  You have a lasting infection.  The lining of your uterus grows outside your uterus.  The lining of your uterus grows in the muscle of your uterus.  Your uterus falls down into your vagina.  You have a growth in your uterus that causes problems.  You have cells that could turn into cancer (precancerous cells).  You have cancer of the uterus or cervix. Types There are 3 types of hysterectomies. Depending on the type, the surgery will:  Remove the top part of the uterus only.  Remove the uterus and the cervix.  Remove the uterus, cervix, and tissue that holds the uterus in place in the lower belly.  Ways a hysterectomy can be performed There are 5 ways this surgery can be performed.  A cut (incision) is made in the belly (abdomen). The uterus is taken out through the cut.  A cut is made in the vagina. The uterus is taken out through the cut.  Three or four cuts are made in the belly. A surgical device with a camera is put through one of the cuts. The uterus is cut into small pieces. The uterus is taken out through the cuts or the vagina.  Three or four cuts are made in the belly. A surgical device with a camera is put through one of the cuts. The uterus is taken out through the vagina.  Three or four cuts are made in the belly. A surgical device that is controlled by a computer makes a visual image. The device helps the surgeon control the surgical tools. The uterus is cut into small pieces. The pieces are taken out through the cuts or through the vagina.  What can I expect after the surgery?  You will be given pain medicine.  You will need help at home for 3-5 days  after surgery.  You will need to see your doctor in 2-4 weeks after surgery.  You may get hot flashes, have night sweats, and have trouble sleeping.  You may need to have Pap tests in the future if your surgery was related to cancer. Talk to your doctor. It is still good to have regular exams. This information is not intended to replace advice given to you by your health care provider. Make sure you discuss any questions you have with your health care provider. Document Released: 11/15/2011 Document Revised: 01/29/2016 Document Reviewed: 04/30/2013 Elsevier Interactive Patient Education  [employer].   Dr. Glendale Chard:  [phone]

## 2017-09-26 NOTE — Telephone Encounter (Signed)
Scheduled patient for mammogram 2/8 @ 330. Called patient, no answer- left message to call us back concerning an appt we have scheduled.

## 2017-09-26 NOTE — Progress Notes (Signed)
History:  62 y.o. G3P0030 here today for AUB. Pt never had a year without menses. Pt reports bleeding until she was given Megace. Pt reports that prior to that she was having almost daily bleeding since 2017. She would occ have days with no bleeding. The reports that there was no pain until 6 months prev after the bleeding stopped for 6 days. She reports occ passage of clots and she repots that sometimes she has gushes of blood.    Pt denies unexplained weight loss. She does report abd bloating.  Pt denies hot flushes or mood changes.  Pt has been on the Megace for 1 month. Pt reports that if she misses pills she'll begin to bleed otherwise it has improved the bleeding but, she is still spotting.   Last PAP unknown >>5 years prev. No h/o abnormal PAPs since age 11 years old. She was s/p cryo.   Pt is s/p myomectomy 20 year prev via laparotomy.    The following portions of the patient's history were reviewed and updated as appropriate: allergies, current medications, past family history, past medical history, past social history, past surgical history and problem list.  Review of Systems:  Pertinent items are noted in HPI.   Objective:  Physical Exam Blood pressure (!) 156/95, pulse 91, height 5\' 4"  (1.626 m), weight 272 lb 14.4 oz (123.8 kg), last menstrual period 04/20/2016.  CONSTITUTIONAL: Well-developed, well-nourished female in no acute distress.  HENT:  Normocephalic, atraumatic EYES: Conjunctivae and EOM are normal. No scleral icterus.  NECK: Normal range of motion SKIN: Skin is warm and dry. No rash noted. Not diaphoretic.No pallor. [place]: Alert and oriented to person, place, and time. Normal coordination.  Lungs: CTA CV: RRR Abd: Soft, nontender and nondistended; obese; well healed transverse incision.  Pelvic: Normal appearing external genitalia; normal appearing vaginal mucosa and cervix.  Normal discharge.  Small uterus, no other palpable masses, no uterine or adnexal  tenderness  The indications for endometrial biopsy were reviewed.   Risks of the biopsy including cramping, bleeding, infection, uterine perforation, inadequate specimen and need for additional procedures  were discussed. The patient states she understands and agrees to undergo procedure today. Consent was signed. Time out was performed. Urine HCG was negative. A sterile speculum was placed in the patient's vagina and the cervix was prepped with Betadine. A single-toothed tenaculum was placed on the anterior lip of the cervix to stabilize it. An attempt was made to introduce a 3 mm pipelle into the endometrial cavity without success.  The patient tolerated the procedure well but requested that we stop due to the pain. She had a mod amount of blood in the  Vaginal vault.   Labs and Imaging 08/19/2017 CLINICAL DATA:  Abnormal perimenopausal bleeding.  EXAM: TRANSABDOMINAL AND TRANSVAGINAL ULTRASOUND OF PELVIS  TECHNIQUE: Both transabdominal and transvaginal ultrasound examinations of the pelvis were performed. Transabdominal technique was performed for global imaging of the pelvis including uterus, ovaries, adnexal regions, and pelvic cul-de-sac. It was necessary to proceed with endovaginal exam following the transabdominal exam to visualize the endometrium and ovaries.  COMPARISON:  10/01/2016  FINDINGS: Uterus  Measurements: 14.5 x 10.4 x 10.9 cm. Multiple uterine fibroids are seen. The largest fibroid measuring 4.7 cm, as well as a second fibroid measuring 2.8 cm, are located centrally consistent with submucosal fibroids. The posterior subserosal fibroid is also seen measuring 4.4 cm . These show no significant change compared to prior study.  Endometrium  Thickness: Could not be visualized due  to acoustic shadowing from fibroids described above.  Right ovary  Measurements: Not directly visualized, however no adnexal mass identified.  Left ovary  Measurements:  Not directly visualized, however no adnexal mass identified.  Other findings  No abnormal free fluid.  IMPRESSION: Multiple uterine fibroids, with at least 2 central submucosal fibroids noted. Endometrial stripe could not be visualized due to these fibroids.  Nonvisualization of ovaries, however no adnexal mass identified.   Assessment & Plan:  1. Abnormal uterine bleeding (AUB) - Cytology - PAP - misoprostol (CYTOTEC) 200 MCG tablet; Place two tablets by mouth 6-8 hours prior to your next clinic appointment  Dispense: 2 tablet; Refill: 0  2. Fibroids - misoprostol (CYTOTEC) 200 MCG tablet; Place two tablets by mouth 6-8 hours prior to your next clinic appointment  Dispense: 2 tablet; Refill: 0  Patient desires surgical management with total abd hyst with bilateral salpingectomy.  Informed pt that she must have her endometrium sampled prior to the OR. Shje would like to be placed on the OR schedule prior to the bx so that her procedure will not be delayed.    3. Breast cancer screening  - [facility] DIGITAL SCREENING BILATERAL; Future  4. Chronic hypertension Refilled HCTZ 25mg  1 po q day.  Pt told that she needs to f/u with her primary care provider for continued management of her BP.   Total face-to-face time with patient was 30 min.  Greater than 50% was spent in counseling and coordination of care with the patient.    Mao L. Harraway-Smith, M.D., Cherlynn June

## 2017-09-27 ENCOUNTER — Encounter: Payer: Self-pay | Admitting: *Deleted

## 2017-09-28 LAB — CYTOLOGY - PAP: HPV: NOT DETECTED

## 2017-09-29 NOTE — Telephone Encounter (Signed)
Called and informed patient. Patient verbalized understanding and confirmed appt time with [place] as well on 2/18. Patient confirmed that she takes the medication 8 hours before the procedure in our office. Told patient yes that is correct. Patient verbalized understanding & had no questions

## 2017-10-11 ENCOUNTER — Encounter (HOSPITAL_COMMUNITY): Payer: Self-pay

## 2017-10-12 ENCOUNTER — Telehealth: Payer: Self-pay | Admitting: *Deleted

## 2017-10-12 NOTE — Telephone Encounter (Signed)
-----   Message from Lavonia Drafts, MD sent at 10/11/2017 11:02 AM EST ----- Regarding: AGCUS Please call pt. She has AGCUS. She needs a colpo and an endo bx. Please schedule.  Thx, clh-S

## 2017-10-12 NOTE — Telephone Encounter (Signed)
Called pt and informed her of Pap results. She will need Colpo and Endo Bx @ her scheduled appt on 10/24/17. Both procedures explained to pt. She will take Misoprostol on [place] of exam as previously instructed. She voiced understanding of all information given.

## 2017-10-14 ENCOUNTER — Ambulatory Visit
Admission: RE | Admit: 2017-10-14 | Discharge: 2017-10-14 | Disposition: A | Discharge status: Home / Self Care | Payer: BLUE CROSS/BLUE SHIELD | Source: Ambulatory Visit | Attending: Obstetrics & Gynecology | Admitting: Obstetrics & Gynecology

## 2017-10-14 ENCOUNTER — Ambulatory Visit: Payer: BLUE CROSS/BLUE SHIELD

## 2017-10-14 DIAGNOSIS — Z1239 Encounter for other screening for malignant neoplasm of breast: Secondary | ICD-10-CM

## 2017-10-17 ENCOUNTER — Telehealth (HOSPITAL_COMMUNITY): Payer: Self-pay

## 2017-10-17 NOTE — Telephone Encounter (Signed)
Called to inform [name] that Dr. Ihor Dow had a surgery cancellation and she could move to an earlier surgery date if she liked. Patient stated she would like to talk it over with some family that would be helping her while she was out and she soul call me back later this week to confirm or decline. Callback number given to [name]

## 2017-10-24 ENCOUNTER — Other Ambulatory Visit (HOSPITAL_COMMUNITY)
Admission: RE | Admit: 2017-10-24 | Discharge: 2017-10-24 | Disposition: A | Discharge status: Home / Self Care | Payer: BLUE CROSS/BLUE SHIELD | Source: Ambulatory Visit | Attending: Obstetrics & Gynecology | Admitting: Obstetrics & Gynecology

## 2017-10-24 ENCOUNTER — Encounter: Payer: Self-pay | Admitting: Obstetrics & Gynecology

## 2017-10-24 ENCOUNTER — Ambulatory Visit (INDEPENDENT_AMBULATORY_CARE_PROVIDER_SITE_OTHER): Payer: BLUE CROSS/BLUE SHIELD | Admitting: Obstetrics & Gynecology

## 2017-10-24 VITALS — BP 151/70 | HR 93 | Wt 266.7 lb

## 2017-10-24 DIAGNOSIS — N939 Abnormal uterine and vaginal bleeding, unspecified: Secondary | ICD-10-CM

## 2017-10-24 DIAGNOSIS — R87619 Unspecified abnormal cytological findings in specimens from cervix uteri: Secondary | ICD-10-CM

## 2017-10-24 NOTE — Progress Notes (Signed)
Pt with almost daily bleeding for > 1 year. Attempted endo bx last month without success. Her PAP came back abnormal. Pt was given cytotec to take. She reports that she had major abd pain due to the cytotec. Patient given informed consent, signed copy in the chart, time out was performed.  Placed in lithotomy position. Cervix viewed with speculum and colposcope after application of acetic acid.   09/26/2017 Satisfactory for evaluation endocervical/transformation zone component PRESENT. Abnormal     Diagnosis ATYPICAL GLANDULAR CELLS. Abnormal    Diagnosis SEE COMMENT. Abnormal    Diagnosis COMMENT: Abnormal    Diagnosis Abnormal  THE ATYPICAL GLANDULAR CELLS APPEAR TO BE OF ENDOMETRIOID ORIGIN. TISSUE STUDIES, SUCH AS A CURETTAGE, MAY HELP BETTER EVALUATE THE EXTENT AND SEVERITY OF THESE ATYPICAL CELLS.     HPV NOT DETECTED   Comment: Normal Reference Range - NOT Detected   08/19/2017 CLINICAL DATA:  Abnormal perimenopausal bleeding.  EXAM: TRANSABDOMINAL AND TRANSVAGINAL ULTRASOUND OF PELVIS  TECHNIQUE: Both transabdominal and transvaginal ultrasound examinations of the pelvis were performed. Transabdominal technique was performed for global imaging of the pelvis including uterus, ovaries, adnexal regions, and pelvic cul-de-sac. It was necessary to proceed with endovaginal exam following the transabdominal exam to visualize the endometrium and ovaries.  COMPARISON:  10/01/2016  FINDINGS: Uterus  Measurements: 14.5 x 10.4 x 10.9 cm. Multiple uterine fibroids are seen. The largest fibroid measuring 4.7 cm, as well as a second fibroid measuring 2.8 cm, are located centrally consistent with submucosal fibroids. The posterior subserosal fibroid is also seen measuring 4.4 cm . These show no significant change compared to prior study.  Endometrium  Thickness: Could not be visualized due to acoustic shadowing from fibroids described above.  Right ovary  Measurements:  Not directly visualized, however no adnexal mass identified.  Left ovary  Measurements: Not directly visualized, however no adnexal mass identified.  Other findings  No abnormal free fluid.  IMPRESSION: Multiple uterine fibroids, with at least 2 central submucosal fibroids noted. Endometrial stripe could not be visualized due to these fibroids.  Nonvisualization of ovaries, however no adnexal mass identified.  The indications for endometrial biopsy were reviewed.   Risks of the biopsy including cramping, bleeding, infection, uterine perforation, inadequate specimen and need for additional procedures  were discussed. The patient states she understands and agrees to undergo procedure today. Consent was signed. Time out was performed. Urine HCG was negative. A sterile speculum was placed in the patient's vagina and the cervix was prepped with Betadine. A single-toothed tenaculum was placed on the anterior lip of the cervix to stabilize it. The 3 mm pipelle was introduced into the endometrial cavity without difficulty to a depth of >13 cm, and a large amount of tissue was obtained and sent to pathology. The instruments were removed from the patient's vagina. Minimal bleeding from the cervix was noted.   Colpo:not done ECC:  Done.  No complications    The patient tolerated the procedure well. Routine post-procedure instructions were given to the patient. The patient will follow up to review the results and for further management.    Lakea L. Harraway-Smith, M.D., Cherlynn June

## 2017-10-27 ENCOUNTER — Telehealth: Payer: Self-pay | Admitting: *Deleted

## 2017-10-27 ENCOUNTER — Ambulatory Visit (INDEPENDENT_AMBULATORY_CARE_PROVIDER_SITE_OTHER): Payer: BLUE CROSS/BLUE SHIELD | Admitting: Obstetrics & Gynecology

## 2017-10-27 DIAGNOSIS — C543 Malignant neoplasm of fundus uteri: Secondary | ICD-10-CM

## 2017-10-27 NOTE — Telephone Encounter (Signed)
Called and spoke with Dr. Lavonia Drafts, gave her the date/time for the new patient appt. Appt on February 26th at [facility] and spoke with the patient, gave the date/tme of the above appt. Also informed the patient to bring a list of her medicines and that she will have a pelvic exam.

## 2017-10-28 ENCOUNTER — Encounter: Payer: Self-pay | Admitting: Obstetrics & Gynecology

## 2017-10-28 ENCOUNTER — Encounter: Payer: Self-pay | Admitting: *Deleted

## 2017-10-28 LAB — COMPREHENSIVE METABOLIC PANEL
ALK PHOS: 86 IU/L (ref 39–117)
ALT: 13 IU/L (ref 0–32)
AST: 16 IU/L (ref 0–40)
Albumin/Globulin Ratio: 1 — ABNORMAL LOW (ref 1.2–2.2)
Albumin: 4.2 g/dL (ref 3.5–5.5)
BUN/Creatinine Ratio: 11 (ref 9–23)
BUN: 14 mg/dL (ref 6–24)
Bilirubin Total: 0.3 mg/dL (ref 0.0–1.2)
CO2: 19 mmol/L — AB (ref 20–29)
CREATININE: 1.33 mg/dL — AB (ref 0.57–1.00)
Calcium: 9.8 mg/dL (ref 8.7–10.2)
Chloride: 105 mmol/L (ref 96–106)
GFR calc Af Amer: 50 mL/min/{1.73_m2} — ABNORMAL LOW (ref >59)
GFR calc non Af Amer: 44 mL/min/{1.73_m2} — ABNORMAL LOW (ref >59)
GLOBULIN, TOTAL: 4.1 g/dL (ref 1.5–4.5)
GLUCOSE: 91 mg/dL (ref 65–99)
Potassium: 4.2 mmol/L (ref 3.5–5.2)
SODIUM: 140 mmol/L (ref 134–144)
Total Protein: 8.3 g/dL (ref 6.0–8.5)

## 2017-10-28 NOTE — Progress Notes (Signed)
History:  62 y.o. G3P0030 here today for f/u of results of endo bx done 10/24/2017.    Pt has never had a year without menses. She reports bleeding daily until she was given Megace n 08/14/2017. Prior to that pt reports that she was having almost daily bleeding since 2017.  Pt reports pain starting 6 months after the bleeding bleeding started.  She reports occ passage of clots and she repots that sometimes she has gushes of blood.    Pt denies unexplained weight loss. She does report abd bloating.  Pt denies hot flushes or mood changes.  Pt has been on the Megace since 08/2016. Pt reports that if she misses pills she'll begin to bleed otherwise it has improved the bleeding but, she is still spotting.   Last PAP prior to 09/26/2017 was >5 years. Pt denies h/o abnormal PAPs since age 65 years old. She was s/p cryo.   Pt is s/p myomectomy 20 year prev via laparotomy.    On 09/26/2017 an attempt was made to do an endo bx with no success. The PAP from 09/26/2017 returned AGCUS.  Pt received Cytotec and a successful endo bx was completed. A large amount of tissue was noted from the [place] concerning for malignancy.    The following portions of the patient's history were reviewed and updated as appropriate: allergies, current medications, past family history, past medical history, past social history, past surgical history and problem list.  Review of Systems:  Pertinent items are noted in HPI.   Objective:  Physical Exam:  CONSTITUTIONAL: Well-developed, well-nourished female in no acute distress.  HENT:  Normocephalic, atraumatic EYES: Conjunctivae and EOM are normal. No scleral icterus.  NECK: Normal range of motion SKIN: Skin is warm and dry. No rash noted. Not diaphoretic.No pallor. [place]: Alert and oriented to person, place, and time. Normal coordination.   Labs and Imaging   09/26/2016 PAP  Component 51mo ago  [place] Satisfactory for evaluation endocervical/transformation zone  component PRESENT. Abnormal    Diagnosis ATYPICAL GLANDULAR CELLS. Abnormal    Diagnosis SEE COMMENT. Abnormal    Diagnosis COMMENT: Abnormal    Diagnosis Abnormal  THE ATYPICAL GLANDULAR CELLS APPEAR TO BE OF ENDOMETRIOID ORIGIN. TISSUE STUDIES, SUCH AS A CURETTAGE, MAY HELP BETTER EVALUATE THE EXTENT AND SEVERITY OF THESE ATYPICAL CELLS.     HPV NOT DETECTED   Comment: Normal Reference Range - NOT Detected  Material Submitted CervicoVaginal Pap [ThinPrep Imaged] Abnormal     10/24/2017 Diagnosis 1. 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 2. 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     Assessment & Plan:  62 yo with AUB and continuous bleeding >1 year prior to Megace. Endo bx results reveals uterine cancer. Pt in for results.   I have reviewed her surg path and discussed with her the need for referral to GYN ONC. Pt was prev scheduled for [facility] for suspected fibroids pending surg path.  Pt aware that she will have surg but, it will be done by GYN ONC at a different hosp and on a diff date. I have explained pt that she will likely need chemo or radiation and possibly both.  Pt was understandably concerned and emotional about the dx. All of her questions were answered as much as possible but, she is aware that future treatment will be dependent on final staging.   Labs: CMP Pelvic/Abd CT ordered for 10/31/2017 Appt scheduled with Dr. Denman George on 11/01/2017  Total face-to-face  time with patient was 30 min.  Greater than 50% was spent in counseling and coordination of care with the patient.   Shantoria L. Harraway-Smith, M.D., Cherlynn June

## 2017-10-31 ENCOUNTER — Ambulatory Visit (HOSPITAL_COMMUNITY)
Admission: RE | Admit: 2017-10-31 | Discharge: 2017-10-31 | Disposition: A | Discharge status: Home / Self Care | Payer: BLUE CROSS/BLUE SHIELD | Source: Ambulatory Visit | Attending: Obstetrics & Gynecology | Admitting: Obstetrics & Gynecology

## 2017-10-31 ENCOUNTER — Encounter (HOSPITAL_COMMUNITY): Payer: Self-pay

## 2017-10-31 ENCOUNTER — Telehealth: Payer: Self-pay | Admitting: *Deleted

## 2017-10-31 ENCOUNTER — Ambulatory Visit (HOSPITAL_COMMUNITY): Payer: BLUE CROSS/BLUE SHIELD

## 2017-10-31 DIAGNOSIS — D259 Leiomyoma of uterus, unspecified: Secondary | ICD-10-CM | POA: Insufficient documentation

## 2017-10-31 DIAGNOSIS — C543 Malignant neoplasm of fundus uteri: Secondary | ICD-10-CM | POA: Diagnosis present

## 2017-10-31 DIAGNOSIS — R590 Localized enlarged lymph nodes: Secondary | ICD-10-CM | POA: Insufficient documentation

## 2017-10-31 DIAGNOSIS — R9389 Abnormal findings on diagnostic imaging of other specified body structures: Secondary | ICD-10-CM | POA: Insufficient documentation

## 2017-10-31 DIAGNOSIS — N839 Noninflammatory disorder of ovary, fallopian tube and broad ligament, unspecified: Secondary | ICD-10-CM | POA: Insufficient documentation

## 2017-10-31 MED ORDER — IOPAMIDOL (ISOVUE-300) INJECTION 61%
100.0000 mL | Freq: Once | INTRAVENOUS | Status: AC | PRN
  Administered 2017-10-31: 100 mL via INTRAVENOUS

## 2017-10-31 MED ORDER — IOPAMIDOL (ISOVUE-300) INJECTION 61%
INTRAVENOUS | Status: AC
  Administered 2017-10-31: 100 mL via INTRAVENOUS
  Filled 2017-10-31: qty 100

## 2017-10-31 NOTE — Telephone Encounter (Signed)
I received authorization from Dr. Ihor Dow and discovered appointment was canceled. I reschedued for Wednesday and called patient.  She had not been notified it was cancelled so I called [facility] CT to see if they can still do it today; and they stated they could since we have the prior auth.  I called Robyn Guerra and notified her she can still get the ct today.

## 2017-10-31 NOTE — Telephone Encounter (Addendum)
-----   Message from Lavonia Drafts, MD sent at 10/31/2017  9:46 AM EST ----- Regarding: RE: CT scan prior auth denied- need MD to MD call Not sure. It must have been a glitch as it was approved. No peer to per needed.   Authorization # [id number]  10/28/17 thru 11/26/2017  clh-S   ----- Message ----- From: Samuel Germany, RN Sent: 10/28/2017  10:19 AM To: Lavonia Drafts, MD Subject: CT scan prior auth denied- need MD to MD call  Dr. Ihor Dow, I was called by pre auth center to get prior auth for CT Scan for this patient and I called [employer] and gave much information and was denied. They state need MD to MD call to see if can be approved.  You must call [employer] [facility] staff at [phone] before 11/01/17 end of day. Her scan is scheduled 10/31/17. You will need to  Give them patient name,  subcriber id: [id number] Also her dob: May 11, 2056

## 2017-11-01 ENCOUNTER — Telehealth: Payer: Self-pay | Admitting: *Deleted

## 2017-11-01 ENCOUNTER — Encounter: Payer: Self-pay | Admitting: Gynecologic Oncology

## 2017-11-01 ENCOUNTER — Inpatient Hospital Stay: Payer: BLUE CROSS/BLUE SHIELD | Attending: Gynecologic Oncology | Admitting: Gynecologic Oncology

## 2017-11-01 ENCOUNTER — Inpatient Hospital Stay: Payer: BLUE CROSS/BLUE SHIELD

## 2017-11-01 VITALS — BP 159/89 | HR 88 | Temp 98.3°F | Resp 20 | Ht 64.0 in | Wt 265.0 lb

## 2017-11-01 DIAGNOSIS — C541 Malignant neoplasm of endometrium: Secondary | ICD-10-CM

## 2017-11-01 DIAGNOSIS — Z6841 Body Mass Index (BMI) 40.0 and over, adult: Secondary | ICD-10-CM | POA: Diagnosis not present

## 2017-11-01 DIAGNOSIS — D219 Benign neoplasm of connective and other soft tissue, unspecified: Secondary | ICD-10-CM

## 2017-11-01 DIAGNOSIS — R1030 Lower abdominal pain, unspecified: Secondary | ICD-10-CM

## 2017-11-01 LAB — HEMOGLOBIN A1C
Hgb A1c MFr Bld: 6.5 % — ABNORMAL HIGH (ref 4.8–5.6)
Mean Plasma Glucose: 139.85 mg/dL

## 2017-11-01 MED ORDER — IBUPROFEN 600 MG PO TABS: 600.0000 mg | ORAL_TABLET | Freq: Four times a day (QID) | ORAL | 1 refills | Status: AC | PRN

## 2017-11-01 NOTE — H&P (View-Only) (Signed)
Consult Note: Gyn-Onc  Consult was requested by Dr. Ihor Dow for the evaluation of Robyn Guerra 62 y.o. female  CC:  Chief Complaint  Patient presents with  . Endometrial cancer [facility])    Assessment/Plan:  Robyn. Robyn Guerra  is a 62 y.o.  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 CT imaging is concerning for metastatic disease to the ovary and possibly lymph nodes. 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   I am recommending an exploratory laparotomy, TAH >250gm, BSO, possible lymphadenectomy and tumor debulking. She has a lower uterine segment that is very broad with minimal uterine mobility. This increases the complexity of the hysterectomy substantially.   I explained surgical risks including  bleeding, infection, damage to internal organs (such as bladder,ureters, bowels), blood clot, reoperation and rehospitalization. I explained that her obesity increased these risks significantly. Additionally, her fibroid bulky uterus increases the risk for hemorrhage and the need for blood transfusion. If the extrauterine extension involves the bowel, she may require a bowel resection and this will increase her risk for infection further.   I discussed that carcinosarcomas of the uterus are typically treated with both surgery and adjuvant chemotherapy and possibly radiation, as there is a very high probability of distant relapse even in case of stage I disease. Therefore, after surgery is completed we will facilitate consultation with a medical oncologist to discuss adjuvant therapies.   She is obese and high risk for occult DM - will check HbA1c.  Due to possible extrauterine disease - will check CA 125.   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 She has elected for surgery here at [facility] on 11/24/17.   HPI: Robyn Guerra is a 62 year old nulliparous woman who is seen in consultation at the request of Dr Ihor Dow for high grade endometrial cancer, fibroid uterus.  The patient reports vaginal bleeding for since December, 2017. It is daily but light. She was evaluated with a TVUS on 10/03/16 which showed a uterus with measurements: 14.4 x 5.8 x 9.7 cm. There appears to be a 3.8 x 3.2 x 3.0 cm right posterior submucosal fibroid abutting the endometrial canal. A slightly larger 4.8 x 4.1 x 3.0 cm left posterior subserosal fibroid is also seen. The uterus is retroverted in nature. The endometrium was thickened to 1.8 cm. Vague debris and trace fluid are seen within the lower uterine segment and cervix.    She continued to have vaginal bleeding and a repeat US was performed on 08/19/18 which showed uterine measurements: 14.5 x 10.4 x 10.9 cm. Multiple uterine fibroids are seen. The largest fibroid measuring 4.7 cm, as well as a second fibroid measuring 2.8 cm, are located centrally consistent with submucosal fibroids. The posterior subserosal fibroid is also seen measuring 4.4 cm . These show no significant change compared to prior study. Endometrium thickness could not be visualized due to acoustic shadowing from fibroids described above.  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   CT abdo/pelvis on 10/31/17 showed  a few small less than 1 cm retroperitoneal lymph nodes are seen in the left paraaortic and aortocaval spaces. 10 mm right lower quadrant mesenteric lymph node is seen. No abdominal aortic aneurysm. Aortic atherosclerosis.  Enlarged uterus measuring  19.5 cm in length. Multiple small uterine fibroids are seen, largest measuring approximately 5cm. Diffuse endometrial thickening is seen measuring 24 mm,consistent with history of known endometrial carcinosarcoma. Poorly defined heterogeneously enhancing mass is seen in the right adnexa abutting the uterus, which measures 6.1 x 5.5 cm. This is suspicious for extra uterine extension of malignancy. No evidence of ascites.  The patient is morbidly obese with a BMI of 46kg/m2. She has HTN. She was negative 1 year ago for diabetes screen. She has had no vaginal deliveries.  She has a remote history of an abdominal myomectomy 20 years ago.    Current Meds:  Outpatient Encounter Medications as of 11/01/2017  Medication Sig  . acetaminophen (TYLENOL) 500 MG tablet Take 1,000 mg by mouth every 6 (six) hours as needed for moderate pain.  . hydrochlorothiazide (HYDRODIURIL) 25 MG tablet Take 1 tablet (25 mg total) by mouth daily.  Marland Kitchen ibuprofen (ADVIL,MOTRIN) 600 MG tablet Take 1 tablet (600 mg total) by mouth every 6 (six) hours as needed.  . megestrol (MEGACE) 40 MG tablet Take 1 tablet (40 mg total) by mouth 2 (two) times daily.  . [DISCONTINUED] misoprostol (CYTOTEC) 200 MCG tablet Place two tablets by mouth 6-8 hours prior to your next clinic appointment   No facility-administered encounter medications on file as of 11/01/2017.     Allergy:  Allergies  Allergen Reactions  . Codeine Nausea And Vomiting    Room spins and causes n/v    Social Hx:   Social History   Socioeconomic History  . Marital status: Married    Spouse name: Not on file  . Number of children: Not on file  . Years of education: Not on file  . Highest education level: Not on file  Social Needs  . Financial resource strain: Not on file  . Food insecurity - worry: Not on file  . Food insecurity - inability: Not on file  . Transportation needs - medical: Not on file  . Transportation needs - non-medical: Not on file   Occupational History  . Not on file  Tobacco Use  . Smoking status: Former Smoker    Years: 4.00    Types: Cigarettes  . Smokeless tobacco: Never Used  . Tobacco comment: smoked cigarrettes when she was 89 to age 66 then quit.   Substance and Sexual Activity  . Alcohol use: No  . Drug use: Not on file  . Sexual activity: Not on file  Other Topics Concern  . Not on file  Social History Narrative  . Not on file    Past Surgical Hx:  Past Surgical History:  Procedure Laterality Date  . MYOMECTOMY      Past Medical Hx:  Past Medical History:  Diagnosis Date  . Fibroids   . Hypertension     Past Gynecological History:  Myomectomy in her 50's for symptomatic uterine fibroids. Patient's last menstrual period was 04/20/2016 (within weeks).  Family Hx:  Family History  Problem Relation Age of Onset  . Diabetes Mother   . Prostate cancer Brother   . Colon cancer Paternal Grandmother     Review of Systems:  Constitutional  Feels well,  ENT Normal appearing ears and nares bilaterally Skin/Breast  No rash, sores, jaundice, itching, dryness  Cardiovascular  No chest pain, shortness of breath, or edema  Pulmonary  No cough or wheeze.  Gastro Intestinal  No nausea, vomitting, or diarrhoea. No bright red blood per rectum, no abdominal pain, change in bowel movement, or constipation.  Genito Urinary  No frequency, urgency, dysuria, + bleeding.  Musculo Skeletal  No myalgia, arthralgia, joint swelling or pain  Neurologic  No weakness, numbness, change in gait,  Psychology  No depression, anxiety, insomnia.   Vitals:  Blood pressure (!) 159/89, pulse 88, temperature 98.3 F (36.8 C), temperature source Oral, resp. rate 20, height 5\' 4"  (1.626 m), weight 265 lb (120.2 kg), last menstrual period 04/20/2016, SpO2 100 %.  Physical Exam: WD in NAD Neck  Supple NROM, without any enlargements.  Lymph Node Survey No cervical supraclavicular or inguinal  adenopathy Cardiovascular  Pulse normal rate, regularity and rhythm. S1 and S2 normal.  Lungs  Clear to auscultation bilateraly, without wheezes/crackles/rhonchi. Good air movement.  Skin  No rash/lesions/breakdown  Psychiatry  Alert and oriented to person, place, and time  Abdomen  Normoactive bowel sounds, abdomen soft, non-tender and obese without evidence of hernia. Uterine mass palpable at level of umbilicus. Back No CVA tenderness Genito Urinary  Vulva/vagina: Normal external female genitalia.  No lesions. No discharge or bleeding.  Bladder/urethra:  No lesions or masses, well supported bladder  Vagina: normal  Cervix: Normal appearing, no lesions. Smooth and not grossly involved by tumor  Uterus:  Minimally mobile, 20cm, broad in lower uterine segment, no parametrial involvement or nodularity.  Adnexa: no discrete masses but exam limited by body habitus and broad uterus. Rectal  Good tone, no masses no cul de sac nodularity.  Extremities  No bilateral cyanosis, clubbing or edema.   Donaciano Eva, MD  11/01/2017, 12:01 PM

## 2017-11-01 NOTE — Patient Instructions (Addendum)
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 It is treated with surgery and chemotherapy, and sometimes also radiation.  We will know more about the stage of your cancer after surgery, however, the CT scan is concerning that there may be stage 3 disease (spread outside the uterus).  We will also be checking a hemoglobin A1C today along with a CA 125 tumor marker.                Preparing for your Surgery  Plan for surgery on November 24, 2017 with Dr. Everitt Amber at [place] will be scheduled for an exploratory laparotomy, total abdominal hysterectomy, bilateral salpingo-oophorectomy, possible lymphadenectomy, possible debulking.  Pre-operative Testing -You will receive a phone call from presurgical testing at [facility] to arrange for a pre-operative testing appointment before your surgery.  This appointment normally occurs one to two weeks before your scheduled surgery.   -Bring your insurance card, copy of an advanced directive if applicable, medication list  -At that visit, you will be asked to sign a consent for a possible blood transfusion in case a transfusion becomes necessary during surgery.  The need for a blood transfusion is rare but having consent is a necessary part of your care.    -You should not be taking blood thinners or aspirin at least ten days prior to surgery unless instructed by your surgeon.  Day Before Surgery at [place] will be asked to take in a light diet the day before surgery.  Avoid carbonated beverages.  You will be advised to have nothing to eat or drink after midnight the evening before.    Eat a light diet the day before surgery.  Examples including soups, broths, toast, yogurt, mashed potatoes.  Things to avoid include carbonated beverages (fizzy beverages), raw fruits and raw vegetables, or beans.   If your bowels are filled with gas, your surgeon will have difficulty visualizing your  pelvic organs which increases your surgical risks.  Starting at 4pm the day before surgery, begin drinking two bottles of magnesium citrate and only take in clear liquids after that time.  Your role in recovery Your role is to become active as soon as directed by your doctor, while still giving yourself time to heal.  Rest when you feel tired. You will be asked to do the following in order to speed your recovery:  - Cough and breathe deeply. This helps toclear and expand your lungs and can prevent pneumonia. You may be given a spirometer to practice deep breathing. A staff member will show you how to use the spirometer. - Do mild physical activity. Walking or moving your legs help your circulation and body functions return to normal. A staff member will help you when you try to walk and will provide you with simple exercises. Do not try to get up or walk alone the first time. - Actively manage your pain. Managing your pain lets you move in comfort. We will ask you to rate your pain on a scale of zero to 10. It is your responsibility to tell your doctor or nurse where and how much you hurt so your pain can be treated.  Special Considerations -If you are diabetic, you may be placed on insulin after surgery to have closer control over your blood sugars to promote healing and recovery.  This does not mean that you will be discharged on insulin.  If applicable, your oral antidiabetics  will be resumed when you are tolerating a solid diet.  -Your final pathology results from surgery should be available by the Friday after surgery and the results will be relayed to you when available.  -Dr. [name] is the Surgeon that assists your GYN Oncologist with surgery.  The next day after your surgery you will either see your GYN Oncologist or Dr. [name].   Blood Transfusion Information WHAT IS A BLOOD TRANSFUSION? A transfusion is the replacement of blood or some of its parts. Blood is  made up of multiple cells which provide different functions.  Red blood cells carry oxygen and are used for blood loss replacement.  White blood cells fight against infection.  Platelets control bleeding.  Plasma helps clot blood.  Other blood products are available for specialized needs, such as hemophilia or other clotting disorders. BEFORE THE TRANSFUSION  Who gives blood for transfusions?   You may be able to donate blood to be used at a later date on yourself (autologous donation).  Relatives can be asked to donate blood. This is generally not any safer than if you have received blood from a stranger. The same precautions are taken to ensure safety when a relative's blood is donated.  Healthy volunteers who are fully evaluated to make sure their blood is safe. This is blood bank blood. Transfusion therapy is the safest it has ever been in the practice of medicine. Before blood is taken from a donor, a complete history is taken to make sure that person has no history of diseases nor engages in risky social behavior (examples are intravenous drug use or sexual activity with multiple partners). The donor's travel history is screened to minimize risk of transmitting infections, such as malaria. The donated blood is tested for signs of infectious diseases, such as HIV and hepatitis. The blood is then tested to be sure it is compatible with you in order to minimize the chance of a transfusion reaction. If you or a relative donates blood, this is often done in anticipation of surgery and is not appropriate for emergency situations. It takes many days to process the donated blood. RISKS AND COMPLICATIONS Although transfusion therapy is very safe and saves many lives, the main dangers of transfusion include:   Getting an infectious disease.  Developing a transfusion reaction. This is an allergic reaction to something in the blood you were given. Every precaution is taken to prevent this. The  decision to have a blood transfusion has been considered carefully by your caregiver before blood is given. Blood is not given unless the benefits outweigh the risks.

## 2017-11-01 NOTE — Telephone Encounter (Signed)
[facility] number #[id number]

## 2017-11-01 NOTE — Progress Notes (Signed)
Consult Note: Gyn-Onc  Consult was requested by Dr. Ihor Dow for the evaluation of Robyn Guerra 62 y.o. female  CC:  Chief Complaint  Patient presents with  . Endometrial cancer [facility])    Assessment/Plan:  Robyn Guerra  is a 62 y.o.  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 CT imaging is concerning for metastatic disease to the ovary and possibly lymph nodes. 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   I am recommending an exploratory laparotomy, TAH >250gm, BSO, possible lymphadenectomy and tumor debulking. She has a lower uterine segment that is very broad with minimal uterine mobility. This increases the complexity of the hysterectomy substantially.   I explained surgical risks including  bleeding, infection, damage to internal organs (such as bladder,ureters, bowels), blood clot, reoperation and rehospitalization. I explained that her obesity increased these risks significantly. Additionally, her fibroid bulky uterus increases the risk for hemorrhage and the need for blood transfusion. If the extrauterine extension involves the bowel, she may require a bowel resection and this will increase her risk for infection further.   I discussed that carcinosarcomas of the uterus are typically treated with both surgery and adjuvant chemotherapy and possibly radiation, as there is a very high probability of distant relapse even in case of stage I disease. Therefore, after surgery is completed we will facilitate consultation with a medical oncologist to discuss adjuvant therapies.   She is obese and high risk for occult DM - will check HbA1c.  Due to possible extrauterine disease - will check CA 125.   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 She has elected for surgery here at [facility] on 11/24/17.   HPI: Robyn Guerra is a 62 year old nulliparous woman who is seen in consultation at the request of Dr Ihor Dow for high grade endometrial cancer, fibroid uterus.  The patient reports vaginal bleeding for since December, 2017. It is daily but light. She was evaluated with a TVUS on 10/03/16 which showed a uterus with measurements: 14.4 x 5.8 x 9.7 cm. There appears to be a 3.8 x 3.2 x 3.0 cm right posterior submucosal fibroid abutting the endometrial canal. A slightly larger 4.8 x 4.1 x 3.0 cm left posterior subserosal fibroid is also seen. The uterus is retroverted in nature. The endometrium was thickened to 1.8 cm. Vague debris and trace fluid are seen within the lower uterine segment and cervix.    She continued to have vaginal bleeding and a repeat US was performed on 08/19/18 which showed uterine measurements: 14.5 x 10.4 x 10.9 cm. Multiple uterine fibroids are seen. The largest fibroid measuring 4.7 cm, as well as a second fibroid measuring 2.8 cm, are located centrally consistent with submucosal fibroids. The posterior subserosal fibroid is also seen measuring 4.4 cm . These show no significant change compared to prior study. Endometrium thickness could not be visualized due to acoustic shadowing from fibroids described above.  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   CT abdo/pelvis on 10/31/17 showed  a few small less than 1 cm retroperitoneal lymph nodes are seen in the left paraaortic and aortocaval spaces. 10 mm right lower quadrant mesenteric lymph node is seen. No abdominal aortic aneurysm. Aortic atherosclerosis.  Enlarged uterus measuring  19.5 cm in length. Multiple small uterine fibroids are seen, largest measuring approximately 5cm. Diffuse endometrial thickening is seen measuring 24 mm,consistent with history of known endometrial carcinosarcoma. Poorly defined heterogeneously enhancing mass is seen in the right adnexa abutting the uterus, which measures 6.1 x 5.5 cm. This is suspicious for extra uterine extension of malignancy. No evidence of ascites.  The patient is morbidly obese with a BMI of 46kg/m2. She has HTN. She was negative 1 year ago for diabetes screen. She has had no vaginal deliveries.  She has a remote history of an abdominal myomectomy 20 years ago.    Current Meds:  Outpatient Encounter Medications as of 11/01/2017  Medication Sig  . acetaminophen (TYLENOL) 500 MG tablet Take 1,000 mg by mouth every 6 (six) hours as needed for moderate pain.  . hydrochlorothiazide (HYDRODIURIL) 25 MG tablet Take 1 tablet (25 mg total) by mouth daily.  Marland Kitchen ibuprofen (ADVIL,MOTRIN) 600 MG tablet Take 1 tablet (600 mg total) by mouth every 6 (six) hours as needed.  . megestrol (MEGACE) 40 MG tablet Take 1 tablet (40 mg total) by mouth 2 (two) times daily.  . [DISCONTINUED] misoprostol (CYTOTEC) 200 MCG tablet Place two tablets by mouth 6-8 hours prior to your next clinic appointment   No facility-administered encounter medications on file as of 11/01/2017.     Allergy:  Allergies  Allergen Reactions  . Codeine Nausea And Vomiting    Room spins and causes n/v    Social Hx:   Social History   Socioeconomic History  . Marital status: Married    Spouse name: Not on file  . Number of children: Not on file  . Years of education: Not on file  . Highest education level: Not on file  Social Needs  . Financial resource strain: Not on file  . Food insecurity - worry: Not on file  . Food insecurity - inability: Not on file  . Transportation needs - medical: Not on file  . Transportation needs - non-medical: Not on file   Occupational History  . Not on file  Tobacco Use  . Smoking status: Former Smoker    Years: 4.00    Types: Cigarettes  . Smokeless tobacco: Never Used  . Tobacco comment: smoked cigarrettes when she was 38 to age 58 then quit.   Substance and Sexual Activity  . Alcohol use: No  . Drug use: Not on file  . Sexual activity: Not on file  Other Topics Concern  . Not on file  Social History Narrative  . Not on file    Past Surgical Hx:  Past Surgical History:  Procedure Laterality Date  . MYOMECTOMY      Past Medical Hx:  Past Medical History:  Diagnosis Date  . Fibroids   . Hypertension     Past Gynecological History:  Myomectomy in her 81's for symptomatic uterine fibroids. Patient's last menstrual period was 04/20/2016 (within weeks).  Family Hx:  Family History  Problem Relation Age of Onset  . Diabetes Mother   . Prostate cancer Brother   . Colon cancer Paternal Grandmother     Review of Systems:  Constitutional  Feels well,  ENT Normal appearing ears and nares bilaterally Skin/Breast  No rash, sores, jaundice, itching, dryness  Cardiovascular  No chest pain, shortness of breath, or edema  Pulmonary  No cough or wheeze.  Gastro Intestinal  No nausea, vomitting, or diarrhoea. No bright red blood per rectum, no abdominal pain, change in bowel movement, or constipation.  Genito Urinary  No frequency, urgency, dysuria, + bleeding.  Musculo Skeletal  No myalgia, arthralgia, joint swelling or pain  Neurologic  No weakness, numbness, change in gait,  Psychology  No depression, anxiety, insomnia.   Vitals:  Blood pressure (!) 159/89, pulse 88, temperature 98.3 F (36.8 C), temperature source Oral, resp. rate 20, height 5\' 4"  (1.626 m), weight 265 lb (120.2 kg), last menstrual period 04/20/2016, SpO2 100 %.  Physical Exam: WD in NAD Neck  Supple NROM, without any enlargements.  Lymph Node Survey No cervical supraclavicular or inguinal  adenopathy Cardiovascular  Pulse normal rate, regularity and rhythm. S1 and S2 normal.  Lungs  Clear to auscultation bilateraly, without wheezes/crackles/rhonchi. Good air movement.  Skin  No rash/lesions/breakdown  Psychiatry  Alert and oriented to person, place, and time  Abdomen  Normoactive bowel sounds, abdomen soft, non-tender and obese without evidence of hernia. Uterine mass palpable at level of umbilicus. Back No CVA tenderness Genito Urinary  Vulva/vagina: Normal external female genitalia.  No lesions. No discharge or bleeding.  Bladder/urethra:  No lesions or masses, well supported bladder  Vagina: normal  Cervix: Normal appearing, no lesions. Smooth and not grossly involved by tumor  Uterus:  Minimally mobile, 20cm, broad in lower uterine segment, no parametrial involvement or nodularity.  Adnexa: no discrete masses but exam limited by body habitus and broad uterus. Rectal  Good tone, no masses no cul de sac nodularity.  Extremities  No bilateral cyanosis, clubbing or edema.   Donaciano Eva, MD  11/01/2017, 12:01 PM

## 2017-11-02 ENCOUNTER — Ambulatory Visit (HOSPITAL_COMMUNITY): Payer: BLUE CROSS/BLUE SHIELD

## 2017-11-02 LAB — CA 125: Cancer Antigen (CA) 125: 511.2 U/mL — ABNORMAL HIGH (ref 0.0–38.1)

## 2017-11-15 NOTE — Patient Instructions (Signed)
CARLOTA PHILLEY  11/15/2017   Your procedure is scheduled on: Thursday 11/24/2017   Report to [facility] Main  Entrance              Report to admitting at  1030 AM    Call this number if you have problems the morning of surgery [phone]     Eat a light diet the day before surgery.  Examples including soups, broths, toast, yogurt, mashed potatoes.  Things to avoid include carbonated beverages (fizzy beverages), raw fruits and raw vegetables, or beans.   If your bowels are filled with gas, your surgeon will have difficulty visualizing your pelvic organs which increases your surgical risks.               Starting at 4 pm the day before surgery, begin drinking two bottles of Magnesium Citrate and only take in clear liquids after that time    NO SOLID FOOD AFTER MIDNIGHT THE NIGHT PRIOR TO SURGERY. NOTHING BY MOUTH EXCEPT CLEAR LIQUIDS UNTIL 3 HOURS PRIOR TO [place] SURGERY. PLEASE FINISH ENSURE DRINK PER SURGEON ORDER 3 HOURS PRIOR TO SCHEDULED SURGERY TIME WHICH NEEDS TO BE COMPLETED AT  1000 am.    CLEAR LIQUID DIET   Foods Allowed                                                                     Foods Excluded  Coffee and tea, regular and decaf                             liquids that you cannot  Plain Jell-O in any flavor                                             see through such as: Fruit ices (not with fruit pulp)                                     milk, soups, orange juice  Iced Popsicles                                    All solid food Carbonated beverages, regular and diet                                    Cranberry, grape and apple juices Sports drinks like Gatorade Lightly seasoned clear broth or consume(fat free) Sugar, honey syrup  Sample Menu Breakfast                                Lunch  Supper Cranberry juice                    Beef broth                            Chicken broth Jell-O                                      Grape juice                           Apple juice Coffee or tea                        Jell-O                                      Popsicle                                                Coffee or tea                        Coffee or tea  _____________________________________________________________________         Take these medicines the morning of surgery with A SIP OF WATER: none                                 You may not have any metal on your body including hair pins and              piercings  Do not wear jewelry, make-up, lotions, powders or perfumes, deodorant             Do not wear nail polish.  Do not shave  48 hours prior to surgery.              Men may shave face and neck.   Do not bring valuables to the hospital. [place].  Contacts, dentures or bridgework may not be worn into surgery.  Leave suitcase in the car. After surgery it may be brought to your room.                  Please read over the following fact sheets you were given: _____________________________________________________________________             [facility] - Preparing for Surgery Before surgery, you can play an important role.  Because skin is not sterile, your skin needs to be as free of germs as possible.  You can reduce the number of germs on your skin by washing with CHG (chlorahexidine gluconate) soap before surgery.  CHG is an antiseptic cleaner which kills germs and bonds with the skin to continue killing germs even after washing. Please DO NOT use if you have an allergy to CHG or antibacterial soaps.  If your skin becomes reddened/irritated stop using the CHG and inform your nurse when you arrive  at Short Stay. Do not shave (including legs and underarms) for at least 48 hours prior to the first CHG shower.  You may shave your face/neck. Please follow these instructions carefully:  1.  Shower with CHG  Soap the night before surgery and the  morning of Surgery.  2.  If you choose to wash your hair, wash your hair first as usual with your  normal  shampoo.  3.  After you shampoo, rinse your hair and body thoroughly to remove the  shampoo.                           4.  Use CHG as you would any other liquid soap.  You can apply chg directly  to the skin and wash                       Gently with a scrungie or clean washcloth.  5.  Apply the CHG Soap to your body ONLY FROM THE NECK DOWN.   Do not use on face/ open                           Wound or open sores. Avoid contact with eyes, ears mouth and genitals (private parts).                       Wash face,  Genitals (private parts) with your normal soap.             6.  Wash thoroughly, paying special attention to the area where your surgery  will be performed.  7.  Thoroughly rinse your body with warm water from the neck down.  8.  DO NOT shower/wash with your normal soap after using and rinsing off  the CHG Soap.                9.  Pat yourself dry with a clean towel.            10.  Wear clean pajamas.            11.  Place clean sheets on your bed the night of your first shower and do not  sleep with pets. Day of Surgery : Do not apply any lotions/deodorants the morning of surgery.  Please wear clean clothes to the hospital/surgery center.  FAILURE TO FOLLOW THESE INSTRUCTIONS MAY RESULT IN THE CANCELLATION OF YOUR SURGERY PATIENT SIGNATURE_________________________________  NURSE SIGNATURE__________________________________  ________________________________________________________________________   Adam Phenix  An incentive spirometer is a tool that can help keep your lungs clear and active. This tool measures how well you are filling your lungs with each breath. Taking long deep breaths may help reverse or decrease the chance of developing breathing (pulmonary) problems (especially infection) following:  A long period of time  when you are unable to move or be active. BEFORE THE PROCEDURE   If the spirometer includes an indicator to show your best effort, your nurse or respiratory therapist will set it to a desired goal.  If possible, sit up straight or lean slightly forward. Try not to slouch.  Hold the incentive spirometer in an upright position. INSTRUCTIONS FOR USE  1. Sit on the edge of your bed if possible, or sit up as far as you can in bed or on a chair. 2. Hold the incentive spirometer in an upright position. 3. Breathe out normally. 4.  Place the mouthpiece in your mouth and seal your lips tightly around it. 5. Breathe in slowly and as deeply as possible, raising the piston or the ball toward the top of the column. 6. Hold your breath for 3-5 seconds or for as long as possible. Allow the piston or ball to fall to the bottom of the column. 7. Remove the mouthpiece from your mouth and breathe out normally. 8. Rest for a few seconds and repeat Steps 1 through 7 at least 10 times every 1-2 hours when you are awake. Take your time and take a few normal breaths between deep breaths. 9. The spirometer may include an indicator to show your best effort. Use the indicator as a goal to work toward during each repetition. 10. After each set of 10 deep breaths, practice coughing to be sure your lungs are clear. If you have an incision (the cut made at the time of surgery), support your incision when coughing by placing a pillow or rolled up towels firmly against it. Once you are able to get out of bed, walk around indoors and cough well. You may stop using the incentive spirometer when instructed by your caregiver.  RISKS AND COMPLICATIONS  Take your time so you do not get dizzy or light-headed.  If you are in pain, you may need to take or ask for pain medication before doing incentive spirometry. It is harder to take a deep breath if you are having pain. AFTER USE  Rest and breathe slowly and easily.  It can be  helpful to keep track of a log of your progress. Your caregiver can provide you with a simple table to help with this. If you are using the spirometer at home, follow these instructions: [place] IF:   You are having difficultly using the spirometer.  You have trouble using the spirometer as often as instructed.  Your pain medication is not giving enough relief while using the spirometer.  You develop fever of 100.5 F (38.1 C) or higher. SEEK IMMEDIATE MEDICAL CARE IF:   You cough up bloody sputum that had not been present before.  You develop fever of 102 F (38.9 C) or greater.  You develop worsening pain at or near the incision site. MAKE SURE YOU:   Understand these instructions.  Will watch your condition.  Will get help right away if you are not doing well or get worse. Document Released: 01/03/2007 Document Revised: 11/15/2011 Document Reviewed: 03/06/2007 ExitCare Patient Information 2014 ExitCare, [place].   ________________________________________________________________________  WHAT IS A BLOOD TRANSFUSION? Blood Transfusion Information  A transfusion is the replacement of blood or some of its parts. Blood is made up of multiple cells which provide different functions.  Red blood cells carry oxygen and are used for blood loss replacement.  White blood cells fight against infection.  Platelets control bleeding.  Plasma helps clot blood.  Other blood products are available for specialized needs, such as hemophilia or other clotting disorders. BEFORE THE TRANSFUSION  Who gives blood for transfusions?   Healthy volunteers who are fully evaluated to make sure their blood is safe. This is blood bank blood. Transfusion therapy is the safest it has ever been in the practice of medicine. Before blood is taken from a donor, a complete history is taken to make sure that person has no history of diseases nor engages in risky social behavior (examples are  intravenous drug use or sexual activity with multiple partners). The donor's travel history is  screened to minimize risk of transmitting infections, such as malaria. The donated blood is tested for signs of infectious diseases, such as HIV and hepatitis. The blood is then tested to be sure it is compatible with you in order to minimize the chance of a transfusion reaction. If you or a relative donates blood, this is often done in anticipation of surgery and is not appropriate for emergency situations. It takes many days to process the donated blood. RISKS AND COMPLICATIONS Although transfusion therapy is very safe and saves many lives, the main dangers of transfusion include:   Getting an infectious disease.  Developing a transfusion reaction. This is an allergic reaction to something in the blood you were given. Every precaution is taken to prevent this. The decision to have a blood transfusion has been considered carefully by your caregiver before blood is given. Blood is not given unless the benefits outweigh the risks. AFTER THE TRANSFUSION  Right after receiving a blood transfusion, you will usually feel much better and more energetic. This is especially true if your red blood cells have gotten low (anemic). The transfusion raises the level of the red blood cells which carry oxygen, and this usually causes an energy increase.  The nurse administering the transfusion will monitor you carefully for complications. HOME CARE INSTRUCTIONS  No special instructions are needed after a transfusion. You may find your energy is better. Speak with your caregiver about any limitations on activity for underlying diseases you may have. SEEK MEDICAL CARE IF:   Your condition is not improving after your transfusion.  You develop redness or irritation at the intravenous (IV) site. SEEK IMMEDIATE MEDICAL CARE IF:  Any of the following symptoms occur over the next 12 hours:  Shaking chills.  You have a  temperature by mouth above 102 F (38.9 C), not controlled by medicine.  Chest, back, or muscle pain.  People around you feel you are not acting correctly or are confused.  Shortness of breath or difficulty breathing.  Dizziness and fainting.  You get a rash or develop hives.  You have a decrease in urine output.  Your urine turns a dark color or changes to pink, red, or brown. Any of the following symptoms occur over the next 10 days:  You have a temperature by mouth above 102 F (38.9 C), not controlled by medicine.  Shortness of breath.  Weakness after normal activity.  The white part of the eye turns yellow (jaundice).  You have a decrease in the amount of urine or are urinating less often.  Your urine turns a dark color or changes to pink, red, or brown. Document Released: 08/20/2000 Document Revised: 11/15/2011 Document Reviewed: 04/08/2008 [facility] Patient Information 2014 [place], [place].  _______________________________________________________________________

## 2017-11-17 ENCOUNTER — Other Ambulatory Visit: Payer: Self-pay

## 2017-11-17 ENCOUNTER — Encounter (HOSPITAL_COMMUNITY): Payer: Self-pay

## 2017-11-17 ENCOUNTER — Encounter (HOSPITAL_COMMUNITY)
Admission: RE | Admit: 2017-11-17 | Discharge: 2017-11-17 | Disposition: A | Discharge status: Home / Self Care | Payer: BLUE CROSS/BLUE SHIELD | Source: Ambulatory Visit | Attending: Gynecologic Oncology | Admitting: Gynecologic Oncology

## 2017-11-17 ENCOUNTER — Other Ambulatory Visit: Payer: Self-pay | Admitting: Gynecologic Oncology

## 2017-11-17 DIAGNOSIS — C541 Malignant neoplasm of endometrium: Secondary | ICD-10-CM | POA: Diagnosis not present

## 2017-11-17 DIAGNOSIS — I10 Essential (primary) hypertension: Secondary | ICD-10-CM | POA: Diagnosis not present

## 2017-11-17 DIAGNOSIS — D259 Leiomyoma of uterus, unspecified: Secondary | ICD-10-CM | POA: Insufficient documentation

## 2017-11-17 DIAGNOSIS — Z87891 Personal history of nicotine dependence: Secondary | ICD-10-CM | POA: Insufficient documentation

## 2017-11-17 DIAGNOSIS — Z01812 Encounter for preprocedural laboratory examination: Secondary | ICD-10-CM | POA: Insufficient documentation

## 2017-11-17 DIAGNOSIS — R1084 Generalized abdominal pain: Secondary | ICD-10-CM

## 2017-11-17 DIAGNOSIS — R9431 Abnormal electrocardiogram [ECG] [EKG]: Secondary | ICD-10-CM | POA: Insufficient documentation

## 2017-11-17 DIAGNOSIS — Z6841 Body Mass Index (BMI) 40.0 and over, adult: Secondary | ICD-10-CM | POA: Insufficient documentation

## 2017-11-17 DIAGNOSIS — Z01810 Encounter for preprocedural cardiovascular examination: Secondary | ICD-10-CM | POA: Insufficient documentation

## 2017-11-17 HISTORY — DX: Chronic sinusitis, unspecified: J32.9

## 2017-11-17 HISTORY — DX: Malignant (primary) neoplasm, unspecified: C80.1

## 2017-11-17 HISTORY — DX: Gastro-esophageal reflux disease without esophagitis: K21.9

## 2017-11-17 LAB — URINALYSIS, ROUTINE W REFLEX MICROSCOPIC
Bilirubin Urine: NEGATIVE
GLUCOSE, UA: NEGATIVE mg/dL
KETONES UR: NEGATIVE mg/dL
NITRITE: NEGATIVE
PROTEIN: 30 mg/dL — AB
Specific Gravity, Urine: 1.026 (ref 1.005–1.030)
pH: 5 (ref 5.0–8.0)

## 2017-11-17 LAB — COMPREHENSIVE METABOLIC PANEL
ALK PHOS: 74 U/L (ref 38–126)
ALT: 17 U/L (ref 14–54)
AST: 23 U/L (ref 15–41)
Albumin: 3.5 g/dL (ref 3.5–5.0)
Anion gap: 9 (ref 5–15)
BUN: 22 mg/dL — ABNORMAL HIGH (ref 6–20)
CALCIUM: 9.2 mg/dL (ref 8.9–10.3)
CO2: 22 mmol/L (ref 22–32)
CREATININE: 1.42 mg/dL — AB (ref 0.44–1.00)
Chloride: 107 mmol/L (ref 101–111)
GFR calc non Af Amer: 40 mL/min — ABNORMAL LOW (ref >60)
GFR, EST AFRICAN AMERICAN: 46 mL/min — AB (ref >60)
GLUCOSE: 88 mg/dL (ref 65–99)
Potassium: 4.9 mmol/L (ref 3.5–5.1)
SODIUM: 138 mmol/L (ref 135–145)
Total Bilirubin: 0.4 mg/dL (ref 0.3–1.2)
Total Protein: 8.5 g/dL — ABNORMAL HIGH (ref 6.5–8.1)

## 2017-11-17 LAB — CBC
HCT: 30.4 % — ABNORMAL LOW (ref 36.0–46.0)
HEMOGLOBIN: 9.6 g/dL — AB (ref 12.0–15.0)
MCH: 22 pg — AB (ref 26.0–34.0)
MCHC: 31.6 g/dL (ref 30.0–36.0)
MCV: 69.7 fL — ABNORMAL LOW (ref 78.0–100.0)
Platelets: 611 10*3/uL — ABNORMAL HIGH (ref 150–400)
RBC: 4.36 MIL/uL (ref 3.87–5.11)
RDW: 16.3 % — ABNORMAL HIGH (ref 11.5–15.5)
WBC: 10.5 10*3/uL (ref 4.0–10.5)

## 2017-11-17 LAB — ABO/RH: ABO/RH(D): B NEG

## 2017-11-17 MED ORDER — TRAMADOL HCL 50 MG PO TABS: 50.0000 mg | ORAL_TABLET | Freq: Four times a day (QID) | ORAL | 0 refills | Status: DC | PRN

## 2017-11-17 NOTE — Progress Notes (Signed)
Patient arrived to the clinic without an appt after her pre-surgical appt at [facility].  She was sent over by RN due to issues with her bowels.  She states she is going every other day but she used to go daily.  She states she took one dose of milk of mag but had loose stools and has not taken any since.  She states she would not described herself as being moderately constipated.  She was wondering about taking Miralax or a stool softener.  Advised she could start taking one capful of Miralax daily and a stool softener daily if needed.  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  She states she has been taking tylenol and ibuprofen but the pain remains.  Wanting to know if there is anything else she can take for pain relief.  We discussed and patient wanting to try tramadol at night time initially to see how she reacts to the medication.  She has an intolerance to codeine.  Advised to take one tramadol tablet tonight and to see how she does with the medication before taking it during the day.  Precautions discussed including do not take and drive.  She is to call with an update on her pain and her bowels.  All questions answered. Patient in the office for 15 minutes.

## 2017-11-17 NOTE — Progress Notes (Signed)
11/01/2017- noted in [place], [place]  10/31/2017- noted in Epic- CT abdomen /pelvis w/contrast

## 2017-11-17 NOTE — Progress Notes (Signed)
   11/17/17 1349  OBSTRUCTIVE SLEEP APNEA  Have you ever been diagnosed with sleep apnea through a sleep study? No  Do you snore loudly (loud enough to be heard through closed doors)?  1  Do you often feel tired, fatigued, or sleepy during the daytime (such as falling asleep during driving or talking to someone)? 0  Has anyone observed you stop breathing during your sleep? 0  Do you have, or are you being treated for high blood pressure? 1  BMI more than 35 kg/m2? 1  Age > 50 (1-yes) 1  Neck circumference greater than:Female 16 inches or larger, Female 17inches or larger? 1  Female Gender (Yes=1) 0  Obstructive Sleep Apnea Score 5  Score 5 or greater  Results sent to PCP

## 2017-11-18 ENCOUNTER — Other Ambulatory Visit: Payer: Self-pay | Admitting: Gynecologic Oncology

## 2017-11-18 ENCOUNTER — Telehealth: Payer: Self-pay | Admitting: *Deleted

## 2017-11-18 NOTE — Telephone Encounter (Signed)
Per result message from [facility] APP, called lab and spoke with Ovid Curd. Added a urine culture to the UA from yesterday. [facility] APP placed orders.

## 2017-11-19 LAB — URINE CULTURE

## 2017-11-24 ENCOUNTER — Inpatient Hospital Stay (HOSPITAL_COMMUNITY): Payer: BLUE CROSS/BLUE SHIELD

## 2017-11-24 ENCOUNTER — Other Ambulatory Visit: Payer: Self-pay

## 2017-11-24 ENCOUNTER — Encounter (HOSPITAL_COMMUNITY)
Admission: RE | Disposition: A | Discharge status: Home / Self Care | Payer: Self-pay | Source: Ambulatory Visit | Attending: Gynecologic Oncology

## 2017-11-24 ENCOUNTER — Inpatient Hospital Stay (HOSPITAL_COMMUNITY)
Admission: RE | Admit: 2017-11-24 | Discharge: 2017-11-27 | DRG: 740 | Disposition: A | Discharge status: Home / Self Care | Payer: BLUE CROSS/BLUE SHIELD | Source: Ambulatory Visit | Attending: Gynecologic Oncology | Admitting: Gynecologic Oncology

## 2017-11-24 ENCOUNTER — Encounter (HOSPITAL_COMMUNITY): Payer: Self-pay

## 2017-11-24 DIAGNOSIS — R188 Other ascites: Secondary | ICD-10-CM | POA: Diagnosis present

## 2017-11-24 DIAGNOSIS — C541 Malignant neoplasm of endometrium: Secondary | ICD-10-CM | POA: Diagnosis present

## 2017-11-24 DIAGNOSIS — I10 Essential (primary) hypertension: Secondary | ICD-10-CM | POA: Diagnosis present

## 2017-11-24 DIAGNOSIS — I700 Atherosclerosis of aorta: Secondary | ICD-10-CM | POA: Diagnosis present

## 2017-11-24 DIAGNOSIS — C786 Secondary malignant neoplasm of retroperitoneum and peritoneum: Secondary | ICD-10-CM

## 2017-11-24 DIAGNOSIS — Z6841 Body Mass Index (BMI) 40.0 and over, adult: Secondary | ICD-10-CM

## 2017-11-24 DIAGNOSIS — D250 Submucous leiomyoma of uterus: Secondary | ICD-10-CM | POA: Diagnosis present

## 2017-11-24 DIAGNOSIS — D62 Acute posthemorrhagic anemia: Secondary | ICD-10-CM | POA: Diagnosis not present

## 2017-11-24 DIAGNOSIS — Z79899 Other long term (current) drug therapy: Secondary | ICD-10-CM

## 2017-11-24 DIAGNOSIS — D219 Benign neoplasm of connective and other soft tissue, unspecified: Secondary | ICD-10-CM | POA: Diagnosis present

## 2017-11-24 DIAGNOSIS — Z87891 Personal history of nicotine dependence: Secondary | ICD-10-CM

## 2017-11-24 DIAGNOSIS — Z885 Allergy status to narcotic agent status: Secondary | ICD-10-CM

## 2017-11-24 DIAGNOSIS — C801 Malignant (primary) neoplasm, unspecified: Secondary | ICD-10-CM

## 2017-11-24 HISTORY — PX: LAPAROTOMY: SHX154

## 2017-11-24 HISTORY — PX: DEBULKING: SHX6277

## 2017-11-24 HISTORY — PX: SALPINGOOPHORECTOMY: SHX82

## 2017-11-24 HISTORY — PX: LYMPH NODE DISSECTION: SHX5087

## 2017-11-24 HISTORY — PX: ABDOMINAL HYSTERECTOMY: SHX81

## 2017-11-24 LAB — CREATININE, SERUM
Creatinine, Ser: 1.52 mg/dL — ABNORMAL HIGH (ref 0.44–1.00)
GFR calc Af Amer: 42 mL/min — ABNORMAL LOW (ref >60)
GFR calc non Af Amer: 36 mL/min — ABNORMAL LOW (ref >60)

## 2017-11-24 LAB — CBC
HEMATOCRIT: 27.5 % — AB (ref 36.0–46.0)
Hemoglobin: 8.4 g/dL — ABNORMAL LOW (ref 12.0–15.0)
MCH: 21.5 pg — AB (ref 26.0–34.0)
MCHC: 30.5 g/dL (ref 30.0–36.0)
MCV: 70.3 fL — AB (ref 78.0–100.0)
Platelets: 470 10*3/uL — ABNORMAL HIGH (ref 150–400)
RBC: 3.91 MIL/uL (ref 3.87–5.11)
RDW: 16.3 % — AB (ref 11.5–15.5)
WBC: 11.1 10*3/uL — ABNORMAL HIGH (ref 4.0–10.5)

## 2017-11-24 LAB — PREPARE RBC (CROSSMATCH)

## 2017-11-24 SURGERY — LAPAROTOMY, EXPLORATORY
Anesthesia: General | Site: Abdomen

## 2017-11-24 MED ORDER — ROCURONIUM BROMIDE 10 MG/ML (PF) SYRINGE
PREFILLED_SYRINGE | INTRAVENOUS | Status: AC
  Filled 2017-11-24: qty 5

## 2017-11-24 MED ORDER — LABETALOL HCL 5 MG/ML IV SOLN
5.0000 mg | Freq: Once | INTRAVENOUS | Status: AC
  Administered 2017-11-24: 5 mg via INTRAVENOUS

## 2017-11-24 MED ORDER — PHENYLEPHRINE 40 MCG/ML (10ML) SYRINGE FOR IV PUSH (FOR BLOOD PRESSURE SUPPORT)
PREFILLED_SYRINGE | INTRAVENOUS | Status: AC
  Filled 2017-11-24: qty 20

## 2017-11-24 MED ORDER — LIDOCAINE 2% (20 MG/ML) 5 ML SYRINGE
INTRAMUSCULAR | Status: AC
  Filled 2017-11-24: qty 5

## 2017-11-24 MED ORDER — ONDANSETRON HCL 4 MG/2ML IJ SOLN
INTRAMUSCULAR | Status: AC
  Filled 2017-11-24: qty 2

## 2017-11-24 MED ORDER — LIDOCAINE 2% (20 MG/ML) 5 ML SYRINGE
INTRAMUSCULAR | Status: DC | PRN
  Administered 2017-11-24: 100 mg via INTRAVENOUS

## 2017-11-24 MED ORDER — FENTANYL CITRATE (PF) 250 MCG/5ML IJ SOLN
INTRAMUSCULAR | Status: DC | PRN
  Administered 2017-11-24 (×5): 50 ug via INTRAVENOUS

## 2017-11-24 MED ORDER — SCOPOLAMINE 1 MG/3DAYS TD PT72
1.0000 | MEDICATED_PATCH | TRANSDERMAL | Status: DC
  Administered 2017-11-24: 1.5 mg via TRANSDERMAL
  Filled 2017-11-24: qty 1

## 2017-11-24 MED ORDER — MIDAZOLAM HCL 5 MG/5ML IJ SOLN
INTRAMUSCULAR | Status: DC | PRN
  Administered 2017-11-24: 2 mg via INTRAVENOUS

## 2017-11-24 MED ORDER — CELECOXIB 200 MG PO CAPS
200.0000 mg | ORAL_CAPSULE | Freq: Two times a day (BID) | ORAL | Status: DC
  Administered 2017-11-24 – 2017-11-27 (×6): 200 mg via ORAL
  Filled 2017-11-24 (×6): qty 1

## 2017-11-24 MED ORDER — SODIUM CHLORIDE 0.9 % IJ SOLN
INTRAMUSCULAR | Status: DC | PRN
  Administered 2017-11-24: 40 mL

## 2017-11-24 MED ORDER — ACETAMINOPHEN 500 MG PO TABS
1000.0000 mg | ORAL_TABLET | Freq: Four times a day (QID) | ORAL | Status: AC
  Administered 2017-11-24 – 2017-11-25 (×4): 1000 mg via ORAL
  Filled 2017-11-24 (×4): qty 2

## 2017-11-24 MED ORDER — SODIUM CHLORIDE 0.9 % IV SOLN
2.0000 g | INTRAVENOUS | Status: AC
  Administered 2017-11-24: 2 g via INTRAVENOUS
  Filled 2017-11-24 (×2): qty 2

## 2017-11-24 MED ORDER — FENTANYL CITRATE (PF) 250 MCG/5ML IJ SOLN
INTRAMUSCULAR | Status: AC
  Filled 2017-11-24: qty 5

## 2017-11-24 MED ORDER — ENOXAPARIN SODIUM 40 MG/0.4ML ~~LOC~~ SOLN
40.0000 mg | SUBCUTANEOUS | Status: DC
  Administered 2017-11-25 – 2017-11-27 (×3): 40 mg via SUBCUTANEOUS
  Filled 2017-11-24 (×2): qty 0.4

## 2017-11-24 MED ORDER — HYDROCHLOROTHIAZIDE 25 MG PO TABS
25.0000 mg | ORAL_TABLET | Freq: Every day | ORAL | Status: DC
  Administered 2017-11-25 – 2017-11-27 (×3): 25 mg via ORAL
  Filled 2017-11-24 (×3): qty 1

## 2017-11-24 MED ORDER — ONDANSETRON HCL 4 MG/2ML IJ SOLN
INTRAMUSCULAR | Status: DC | PRN
  Administered 2017-11-24: 4 mg via INTRAVENOUS

## 2017-11-24 MED ORDER — DEXTROSE-NACL 5-0.45 % IV SOLN
INTRAVENOUS | Status: DC
  Administered 2017-11-24 – 2017-11-25 (×2): 1000 mL via INTRAVENOUS
  Administered 2017-11-25: 08:00:00 via INTRAVENOUS

## 2017-11-24 MED ORDER — SUGAMMADEX SODIUM 500 MG/5ML IV SOLN
INTRAVENOUS | Status: DC | PRN
  Administered 2017-11-24: 474.4 mg via INTRAVENOUS

## 2017-11-24 MED ORDER — CEFOXITIN SODIUM 2 G IV SOLR
INTRAVENOUS | Status: DC | PRN
  Administered 2017-11-24: 2 g via INTRAVENOUS

## 2017-11-24 MED ORDER — ONDANSETRON HCL 4 MG/2ML IJ SOLN: 4.0000 mg | Freq: Four times a day (QID) | INTRAMUSCULAR | Status: DC | PRN

## 2017-11-24 MED ORDER — LIDOCAINE 2% (20 MG/ML) 5 ML SYRINGE
INTRAMUSCULAR | Status: DC | PRN
  Administered 2017-11-24: 1.5 mg/kg/h via INTRAVENOUS

## 2017-11-24 MED ORDER — DEXAMETHASONE SODIUM PHOSPHATE 4 MG/ML IJ SOLN: 4.0000 mg | INTRAMUSCULAR | Status: DC

## 2017-11-24 MED ORDER — BUPIVACAINE HCL (PF) 0.25 % IJ SOLN
INTRAMUSCULAR | Status: DC | PRN
  Administered 2017-11-24: 20 mL

## 2017-11-24 MED ORDER — ROCURONIUM BROMIDE 50 MG/5ML IV SOSY
PREFILLED_SYRINGE | INTRAVENOUS | Status: DC | PRN
Start: 2017-11-24 — End: 2017-11-24
  Administered 2017-11-24 (×2): 20 mg via INTRAVENOUS
  Administered 2017-11-24: 10 mg via INTRAVENOUS
  Administered 2017-11-24: 50 mg via INTRAVENOUS

## 2017-11-24 MED ORDER — BUPIVACAINE HCL (PF) 0.25 % IJ SOLN
INTRAMUSCULAR | Status: AC
  Filled 2017-11-24: qty 30

## 2017-11-24 MED ORDER — FENTANYL CITRATE (PF) 100 MCG/2ML IJ SOLN
INTRAMUSCULAR | Status: AC
  Filled 2017-11-24: qty 4

## 2017-11-24 MED ORDER — KETOROLAC TROMETHAMINE 30 MG/ML IJ SOLN
30.0000 mg | Freq: Four times a day (QID) | INTRAMUSCULAR | Status: DC
  Administered 2017-11-24 – 2017-11-26 (×6): 30 mg via INTRAVENOUS
  Filled 2017-11-24 (×6): qty 1

## 2017-11-24 MED ORDER — KETAMINE HCL 10 MG/ML IJ SOLN
INTRAMUSCULAR | Status: DC | PRN
  Administered 2017-11-24: 25 mg via INTRAVENOUS

## 2017-11-24 MED ORDER — SUGAMMADEX SODIUM 500 MG/5ML IV SOLN
INTRAVENOUS | Status: AC
  Filled 2017-11-24: qty 5

## 2017-11-24 MED ORDER — ACETAMINOPHEN 500 MG PO TABS
1000.0000 mg | ORAL_TABLET | ORAL | Status: AC
  Administered 2017-11-24: 1000 mg via ORAL
  Filled 2017-11-24: qty 2

## 2017-11-24 MED ORDER — ENOXAPARIN SODIUM 40 MG/0.4ML ~~LOC~~ SOLN
40.0000 mg | SUBCUTANEOUS | Status: AC
  Administered 2017-11-24: 40 mg via SUBCUTANEOUS
  Filled 2017-11-24: qty 0.4

## 2017-11-24 MED ORDER — PHENYLEPHRINE HCL 10 MG/ML IJ SOLN
INTRAMUSCULAR | Status: DC | PRN
  Administered 2017-11-24 (×2): 80 ug via INTRAVENOUS

## 2017-11-24 MED ORDER — PROPOFOL 10 MG/ML IV BOLUS
INTRAVENOUS | Status: AC
  Filled 2017-11-24: qty 20

## 2017-11-24 MED ORDER — SUCCINYLCHOLINE CHLORIDE 200 MG/10ML IV SOSY
PREFILLED_SYRINGE | INTRAVENOUS | Status: AC
  Filled 2017-11-24: qty 10

## 2017-11-24 MED ORDER — HYDROMORPHONE HCL 1 MG/ML IJ SOLN
0.5000 mg | INTRAMUSCULAR | Status: DC | PRN
  Administered 2017-11-24 – 2017-11-25 (×3): 0.5 mg via INTRAVENOUS
  Filled 2017-11-24 (×3): qty 0.5

## 2017-11-24 MED ORDER — ENSURE PRE-SURGERY PO LIQD
592.0000 mL | Freq: Once | ORAL | Status: DC
  Filled 2017-11-24: qty 592

## 2017-11-24 MED ORDER — SUCCINYLCHOLINE CHLORIDE 200 MG/10ML IV SOSY
PREFILLED_SYRINGE | INTRAVENOUS | Status: DC | PRN
  Administered 2017-11-24: 110 mg via INTRAVENOUS

## 2017-11-24 MED ORDER — DEXAMETHASONE SODIUM PHOSPHATE 10 MG/ML IJ SOLN
INTRAMUSCULAR | Status: AC
  Filled 2017-11-24: qty 1

## 2017-11-24 MED ORDER — LACTATED RINGERS IV SOLN
INTRAVENOUS | Status: DC
  Administered 2017-11-24 (×2): via INTRAVENOUS

## 2017-11-24 MED ORDER — LABETALOL HCL 5 MG/ML IV SOLN
INTRAVENOUS | Status: AC
  Filled 2017-11-24: qty 4

## 2017-11-24 MED ORDER — LACTATED RINGERS IV SOLN
INTRAVENOUS | Status: DC
  Administered 2017-11-24: 14:00:00 via INTRAVENOUS

## 2017-11-24 MED ORDER — PHENYLEPHRINE 40 MCG/ML (10ML) SYRINGE FOR IV PUSH (FOR BLOOD PRESSURE SUPPORT)
PREFILLED_SYRINGE | INTRAVENOUS | Status: AC
  Filled 2017-11-24: qty 10

## 2017-11-24 MED ORDER — CELECOXIB 200 MG PO CAPS
400.0000 mg | ORAL_CAPSULE | ORAL | Status: AC
  Administered 2017-11-24: 400 mg via ORAL
  Filled 2017-11-24: qty 2

## 2017-11-24 MED ORDER — TRAMADOL HCL 50 MG PO TABS
50.0000 mg | ORAL_TABLET | Freq: Four times a day (QID) | ORAL | Status: DC | PRN
  Administered 2017-11-26: 50 mg via ORAL
  Filled 2017-11-24: qty 1

## 2017-11-24 MED ORDER — DEXAMETHASONE SODIUM PHOSPHATE 10 MG/ML IJ SOLN
INTRAMUSCULAR | Status: DC | PRN
Start: 2017-11-24 — End: 2017-11-24
  Administered 2017-11-24: 10 mg via INTRAVENOUS

## 2017-11-24 MED ORDER — PROPOFOL 10 MG/ML IV BOLUS
INTRAVENOUS | Status: DC | PRN
  Administered 2017-11-24: 140 mg via INTRAVENOUS

## 2017-11-24 MED ORDER — FENTANYL CITRATE (PF) 100 MCG/2ML IJ SOLN
25.0000 ug | INTRAMUSCULAR | Status: DC | PRN
  Administered 2017-11-24 (×2): 50 ug via INTRAVENOUS

## 2017-11-24 MED ORDER — ONDANSETRON HCL 4 MG PO TABS: 4.0000 mg | ORAL_TABLET | Freq: Four times a day (QID) | ORAL | Status: DC | PRN

## 2017-11-24 MED ORDER — GABAPENTIN 300 MG PO CAPS
300.0000 mg | ORAL_CAPSULE | Freq: Two times a day (BID) | ORAL | Status: DC
  Administered 2017-11-24 – 2017-11-27 (×6): 300 mg via ORAL
  Filled 2017-11-24 (×6): qty 1

## 2017-11-24 MED ORDER — OXYCODONE-ACETAMINOPHEN 5-325 MG PO TABS
1.0000 | ORAL_TABLET | ORAL | Status: DC | PRN
  Administered 2017-11-25 – 2017-11-26 (×2): 1 via ORAL
  Administered 2017-11-26 – 2017-11-27 (×2): 2 via ORAL
  Filled 2017-11-24: qty 1
  Filled 2017-11-24: qty 2
  Filled 2017-11-24: qty 1
  Filled 2017-11-24: qty 2

## 2017-11-24 MED ORDER — MIDAZOLAM HCL 2 MG/2ML IJ SOLN
INTRAMUSCULAR | Status: AC
  Filled 2017-11-24: qty 2

## 2017-11-24 MED ORDER — SODIUM CHLORIDE 0.9 % IJ SOLN
INTRAMUSCULAR | Status: AC
  Filled 2017-11-24: qty 50

## 2017-11-24 MED ORDER — BUPIVACAINE LIPOSOME 1.3 % IJ SUSP
20.0000 mL | Freq: Once | INTRAMUSCULAR | Status: AC
  Administered 2017-11-24: 20 mL
  Filled 2017-11-24: qty 20

## 2017-11-24 MED ORDER — GABAPENTIN 300 MG PO CAPS
300.0000 mg | ORAL_CAPSULE | ORAL | Status: AC
  Administered 2017-11-24: 300 mg via ORAL
  Filled 2017-11-24: qty 1

## 2017-11-24 SURGICAL SUPPLY — 70 items
ADH SKN CLS APL DERMABOND .7 (GAUZE/BANDAGES/DRESSINGS) ×3
AGENT HMST MTR 8 SURGIFLO (HEMOSTASIS)
ATTRACTOMAT 16X20 MAGNETIC DRP (DRAPES) ×4 IMPLANT
BLADE EXTENDED COATED 6.5IN (ELECTRODE) ×4 IMPLANT
CELLS DAT CNTRL 66122 CELL SVR (MISCELLANEOUS) IMPLANT
CHLORAPREP W/TINT 26ML (MISCELLANEOUS) ×4 IMPLANT
CLIP TI LARGE 6 (CLIP) ×2 IMPLANT
CLIP VESOCCLUDE LG 6/CT (CLIP) ×4 IMPLANT
CLIP VESOCCLUDE MED 6/CT (CLIP) ×6 IMPLANT
CLIP VESOCCLUDE MED LG 6/CT (CLIP) ×4 IMPLANT
CONT SPEC 4OZ CLIKSEAL STRL BL (MISCELLANEOUS) ×4 IMPLANT
DERMABOND ADVANCED (GAUZE/BANDAGES/DRESSINGS) ×1
DERMABOND ADVANCED .7 DNX12 (GAUZE/BANDAGES/DRESSINGS) IMPLANT
DRAPE INCISE IOBAN 66X45 STRL (DRAPES) ×4 IMPLANT
DRAPE WARM FLUID 44X44 (DRAPE) ×4 IMPLANT
DRSG OPSITE POSTOP 4X10 (GAUZE/BANDAGES/DRESSINGS) IMPLANT
DRSG OPSITE POSTOP 4X12 (GAUZE/BANDAGES/DRESSINGS) IMPLANT
DRSG OPSITE POSTOP 4X6 (GAUZE/BANDAGES/DRESSINGS) IMPLANT
DRSG OPSITE POSTOP 4X8 (GAUZE/BANDAGES/DRESSINGS) ×1 IMPLANT
ELECT REM PT RETURN 15FT ADLT (MISCELLANEOUS) ×4 IMPLANT
GAUZE SPONGE 4X4 12PLY STRL (GAUZE/BANDAGES/DRESSINGS) ×4 IMPLANT
GAUZE SPONGE 4X4 16PLY XRAY LF (GAUZE/BANDAGES/DRESSINGS) ×1 IMPLANT
GLOVE BIO SURGEON STRL SZ 6 (GLOVE) ×8 IMPLANT
GLOVE BIO SURGEON STRL SZ 6.5 (GLOVE) ×8 IMPLANT
GOWN STRL REUS W/ TWL LRG LVL3 (GOWN DISPOSABLE) ×6 IMPLANT
GOWN STRL REUS W/TWL LRG LVL3 (GOWN DISPOSABLE) ×8
HANDLE SUCTION POOLE (INSTRUMENTS) IMPLANT
HEMOSTAT ARISTA ABSORB 3G PWDR (MISCELLANEOUS) IMPLANT
KIT BASIN OR (CUSTOM PROCEDURE TRAY) ×4 IMPLANT
LIGASURE IMPACT 36 18CM CVD LR (INSTRUMENTS) ×1 IMPLANT
LOOP VESSEL MAXI BLUE (MISCELLANEOUS) ×1 IMPLANT
NEEDLE HYPO 22GX1.5 SAFETY (NEEDLE) ×8 IMPLANT
PACK GENERAL/GYN (CUSTOM PROCEDURE TRAY) ×4 IMPLANT
RELOAD PROXIMATE 75MM BLUE (ENDOMECHANICALS) IMPLANT
RELOAD PROXIMATE TA60MM BLUE (ENDOMECHANICALS) IMPLANT
RELOAD STAPLE 60 BLU REG PROX (ENDOMECHANICALS) IMPLANT
RELOAD STAPLE 75 3.8 BLU REG (ENDOMECHANICALS) IMPLANT
RETRACTOR WND ALEXIS 18 MED (MISCELLANEOUS) IMPLANT
RETRACTOR WND ALEXIS 25 LRG (MISCELLANEOUS) IMPLANT
RTRCTR WOUND ALEXIS 18CM MED (MISCELLANEOUS)
RTRCTR WOUND ALEXIS 25CM LRG (MISCELLANEOUS) ×4
SHEET LAVH (DRAPES) ×4 IMPLANT
SPOGE SURGIFLO 8M (HEMOSTASIS)
SPONGE LAP 18X18 X RAY DECT (DISPOSABLE) ×3 IMPLANT
SPONGE SURGIFLO 8M (HEMOSTASIS) IMPLANT
STAPLER GUN LINEAR PROX 60 (STAPLE) IMPLANT
STAPLER PROXIMATE 75MM BLUE (STAPLE) IMPLANT
STAPLER VISISTAT 35W (STAPLE) IMPLANT
SUCTION POOLE HANDLE (INSTRUMENTS) ×4
SUT MNCRL AB 4-0 PS2 18 (SUTURE) ×8 IMPLANT
SUT PDS AB 1 TP1 96 (SUTURE) ×8 IMPLANT
SUT PROLENE 5 0 CC 1 (SUTURE) IMPLANT
SUT SILK 2 0 SH (SUTURE) ×1 IMPLANT
SUT SILK 3 0 SH CR/8 (SUTURE) IMPLANT
SUT VIC AB 0 CT1 36 (SUTURE) ×19 IMPLANT
SUT VIC AB 2-0 CT1 36 (SUTURE) ×9 IMPLANT
SUT VIC AB 2-0 CT2 27 (SUTURE) ×29 IMPLANT
SUT VIC AB 2-0 SH 27 (SUTURE) ×8
SUT VIC AB 2-0 SH 27X BRD (SUTURE) ×6 IMPLANT
SUT VIC AB 3-0 CTX 36 (SUTURE) ×1 IMPLANT
SUT VIC AB 3-0 SH 18 (SUTURE) IMPLANT
SUT VIC AB 3-0 SH 27 (SUTURE) ×4
SUT VIC AB 3-0 SH 27X BRD (SUTURE) ×3 IMPLANT
SUT VICRYL 2 0 18  UND BR (SUTURE) ×1
SUT VICRYL 2 0 18 UND BR (SUTURE) ×3 IMPLANT
SYR 30ML LL (SYRINGE) ×8 IMPLANT
TOWEL OR 17X26 10 PK STRL BLUE (TOWEL DISPOSABLE) ×8 IMPLANT
TOWEL OR NON WOVEN STRL DISP B (DISPOSABLE) ×4 IMPLANT
TRAY FOLEY W/METER SILVER 16FR (SET/KITS/TRAYS/PACK) ×4 IMPLANT
UNDERPAD 30X30 (UNDERPADS AND DIAPERS) ×4 IMPLANT

## 2017-11-24 NOTE — Interval H&P Note (Signed)
History and Physical Interval Note:  11/24/2017 11:05 AM  Robyn Guerra  has presented today for surgery, with the diagnosis of ENDOMETRIAL CANCER,FIBROIDS  The various methods of treatment have been discussed with the patient and family. After consideration of risks, benefits and other options for treatment, the patient has consented to  Procedure(s): EXPLORATORY LAPAROTOMY (N/A) TOTAL HYSTERECTOMY ABDOMINALGREATER 250 GRAMS (N/A) BIJLATERAL SALPINGO OOPHORECTOMY (Bilateral) POSSIBLE LYMPHADENECTOMY (N/A) POSSBILE DEBULKING (N/A) as a surgical intervention .  The patient's history has been reviewed, patient examined, no change in status, stable for surgery.  I have reviewed the patient's chart and labs.  Questions were answered to the patient's satisfaction.     Thereasa Solo

## 2017-11-24 NOTE — Anesthesia Procedure Notes (Signed)
Procedure Name: Intubation Date/Time: 11/24/2017 2:05 PM Performed by: Maxwell Caul, CRNA Pre-anesthesia Checklist: Patient identified, Emergency Drugs available, Suction available and Patient being monitored Patient Re-evaluated:Patient Re-evaluated prior to induction Oxygen Delivery Method: Circle system utilized Preoxygenation: Pre-oxygenation with 100% oxygen Induction Type: IV induction Ventilation: Mask ventilation without difficulty Laryngoscope Size: Mac and 4 Grade View: Grade II Tube type: Oral Tube size: 7.5 mm Number of attempts: 1 Airway Equipment and Method: Stylet Placement Confirmation: ETT inserted through vocal cords under direct vision,  positive ETCO2 and breath sounds checked- equal and bilateral Secured at: 21 cm Tube secured with: Tape Dental Injury: Teeth and Oropharynx as per pre-operative assessment  Comments: All teeth remain as they were pre-op.

## 2017-11-24 NOTE — Anesthesia Preprocedure Evaluation (Addendum)
Anesthesia Evaluation  Patient identified by MRN, date of birth, ID band Patient awake    Reviewed: Allergy & Precautions, H&P , Patient's Chart, lab work & pertinent test results, reviewed documented beta blocker date and time   Airway Mallampati: II  TM Distance: >3 FB Neck ROM: full    Dental no notable dental hx. (+) Poor Dentition, Loose, Dental Advisory Given   Pulmonary former smoker,    Pulmonary exam normal breath sounds clear to auscultation       Cardiovascular hypertension,  Rhythm:regular Rate:Normal     Neuro/Psych    GI/Hepatic   Endo/Other  Morbid obesity  Renal/GU      Musculoskeletal   Abdominal   Peds  Hematology  (+) anemia ,   Anesthesia Other Findings   Reproductive/Obstetrics                            Anesthesia Physical Anesthesia Plan  ASA: III  Anesthesia Plan: General   Post-op Pain Management:    Induction: Intravenous  PONV Risk Score and Plan: 2 and Dexamethasone, Ondansetron and Treatment may vary due to age or medical condition  Airway Management Planned: Oral ETT  Additional Equipment: Arterial line  Intra-op Plan:   Post-operative Plan: Extubation in OR  Informed Consent: I have reviewed the patients History and Physical, chart, labs and discussed the procedure including the risks, benefits and alternatives for the proposed anesthesia with the patient or authorized representative who has indicated his/her understanding and acceptance.   Dental Advisory Given  Plan Discussed with: CRNA and Surgeon  Anesthesia Plan Comments: ( Discussed seriously loose teeth Discussed transfusion )       Anesthesia Quick Evaluation

## 2017-11-24 NOTE — Op Note (Signed)
OPERATIVE NOTE  Preoperative Diagnosis: 1. Carcinosarcoma of the uterus   Postoperative Diagnosis:same, stage IVB carcinosarcoma of the uterus.    Procedure(s) Performed: 1. Exploratory laparotomy with tptal abdominal hysterectomy, bilateral salpingo-oophorectomy, omentectomy radical tumor debulking for metastatic endometrial cancer .  Surgeon: Thereasa Solo, MD.  Assistant Surgeon: Precious Haws, M.D. Assistant: (an MD assistant was necessary for tissue manipulation, retraction and positioning due to the complexity of the case and hospital policies).   Specimens: Uterus, Bilateral tubes / ovaries, omentum.    Estimated Blood Loss: 400 mL.    Urine Output:100cc  IVF: [id number]  Complications: None.   Operative Findings: large volume ascites, omental caking of tumor densely adherent to anterior abdominal wall, infiltrating transverse colon mesentery and falciform ligament. Tumor coating right diaphragm. There were loops of ileum adherent to the uterine fundus and sigmoid colon. The uterus was grossly enlarged to 20cm with tumor involving the right ovary and the left ovary adherent to the uterus.  Residual tumor at the completion of the surgery coating the diaphragms (thin) with <1cm implants in mesentery of bowel.    This represented an optimal cytoreduction (R1).   Procedure:   The patient was seen in the Holding Room. The risks, benefits, complications, treatment options, and expected outcomes were discussed with the patient.  The patient concurred with the proposed plan, giving informed consent.   The patient was  identified as Robyn Guerra  and the procedure verified as TAH, BSO, possible lymphadenectomy omentectomy, possible tumor debulking. A Time Out was held and the above information confirmed upon entry to the operating room..  After induction of anesthesia, the patient was draped and prepped in the usual sterile manner.  She was prepped and draped in the normal sterile fashion in  the dorsal lithotomy position in padded Allen stirrups with good attention paid to support of the lower back and lower extremities. Position was adjusted for appropriate support. A Foley catheter was placed to gravity.   A right paramedianl incision was made and carried through the subcutaneous tissue to the fascia. The fascial incision was made and extended superiorally. The rectus muscles were separated. The peritoneum was identified and entered. Peritoneal incision was extended longitudinally.  The abdominal cavity was entered sharply and without incident. A Bookwalter retractor was then placed. A survey of the abdomen and pelvis revealed the above findings, which were significant for stage IVB disease with an omental cake and large bulky uterine tumor with dense bowel adhesions to the fundus.  The omental cake was dissected free from the transverse colon from the hepatic flexure to the splenic flexure using sharp metzenbaum scissor dissection. The lesser sac was entered. The tumor cake was separated from the mesentery of the transverse colon. The short gastric vessels were sealed with ligasure and the infragastric omentum was separated from the greater curvature of the stomach removing all bulky tumor. Hemostasis was confirmed. The colon was closely inspected and was noted to be intact and hemostatic.  The small bowel loops were carefully dissected from the uterine fundus with scissors. The sigmoid colon was also separated from the uterus with sharp dissection.   After packing the small bowel into the upper abdomen, we commenced the hysterectomy was begun by entering the  pelvic sidewall just posterior to the right round ligament. The pararectal space was developed and the retroperitoneum developed up to the level of the common iliac artery.  The course of the ureter was identified. The right IP was then skeletonized, and fulgarated  and transected with the ligasure. The ovary was separated from its  peritoneal attachments with the bovie with visualization of the ureter at all times.   The sigmoid colon was dissected from its dense tumor attachments to the left ovary using sharp dissection. The left retroperitoneal peritoneum was entered parallel to the sigmoid colon attachments and the left ureter was identified in the left retroperitoneal space. Using sharp and monopolar dissection, the left tube and ovary were freed from their peritoneal adhesions to the pelvis and sigmoid colon. The IP ligament was sealed with the ligasure.   The uterine vessels were clamped bilaterally at the uterine isthmus with curved Heaney clamps then the pedicles were transected and suture ligated. Successive passes of straight Heaney clamps were used down the cardinal ligaments bilaterally with each pass medial to the prior. The pedicles were sharply transected and made hemostatic with suture. The bladder was ensured to be below the cervicovaginal junction, then curved clamps were passed across the cervicovaginal junction and the uterus was sharply transected. The vaginal cuff was closed with 0-vicryl on interrupted figure of 8 suture.  The peritoneal cavity was irrigated and hemostasis was created with interrupted 3-0 vicryl sutures. Hemostasis was confirmed at all surgical sites.  Residual tumor was present at the diaphragm and mesentery.   The fascia was reapproximated with #1 looped PDS using a total of two sutures. The subcutaneous layer was then irrigated copiously.  Exparel long acting local anesthetic was infiltrated into the subcutaneous tissues. The skin was closed with staples. The patient tolerated the procedure well.   Sponge, lap and needle counts were correct x 2.

## 2017-11-24 NOTE — Transfer of Care (Signed)
Immediate Anesthesia Transfer of Care Note  Patient: Robyn Guerra  Procedure(s) Performed: EXPLORATORY LAPAROTOMY (N/A ) TOTAL HYSTERECTOMY ABDOMINALGREATER 250 GRAMS (N/A Abdomen) BIJLATERAL SALPINGO OOPHORECTOMY (Bilateral ) OMENTECTOMY (N/A Abdomen) RADICAL TUMOR DEBULKING (N/A )  Patient Location: PACU  Anesthesia Type:General  Level of Consciousness: awake, alert  and oriented  Airway & Oxygen Therapy: Patient Spontanous Breathing and Patient connected to face mask oxygen  Post-op Assessment: Report given to RN  Post vital signs: Reviewed and stable  Last Vitals:  Vitals Value Taken Time  BP 168/102 11/24/2017  5:57 PM  Temp    Pulse 107 11/24/2017  5:59 PM  Resp 16 11/24/2017  5:59 PM  SpO2 100 % 11/24/2017  5:59 PM  Vitals shown include unvalidated device data.  Last Pain:  Vitals:   11/24/17 1106  TempSrc:   PainSc: 0-No pain         Complications: No apparent anesthesia complications

## 2017-11-24 NOTE — Anesthesia Procedure Notes (Signed)
Arterial Line Insertion Start/End3/21/2019 2:30 PM Performed by: Maxwell Caul, CRNA, CRNA  Patient location: OR. Preanesthetic checklist: patient identified, IV checked, site marked, risks and benefits discussed, surgical consent, monitors and equipment checked, pre-op evaluation, timeout performed and anesthesia consent Right, radial was placed Catheter size: 20 G  Attempts: 3 (Dr Glennon Mac on left side, CRNA on right) Procedure performed without using ultrasound guided technique.

## 2017-11-25 ENCOUNTER — Encounter (HOSPITAL_COMMUNITY): Payer: Self-pay | Admitting: Gynecologic Oncology

## 2017-11-25 LAB — BASIC METABOLIC PANEL
Anion gap: 7 (ref 5–15)
BUN: 23 mg/dL — AB (ref 6–20)
CO2: 23 mmol/L (ref 22–32)
Calcium: 8.3 mg/dL — ABNORMAL LOW (ref 8.9–10.3)
Chloride: 107 mmol/L (ref 101–111)
Creatinine, Ser: 1.49 mg/dL — ABNORMAL HIGH (ref 0.44–1.00)
GFR calc Af Amer: 43 mL/min — ABNORMAL LOW (ref >60)
GFR calc non Af Amer: 37 mL/min — ABNORMAL LOW (ref >60)
Glucose, Bld: 170 mg/dL — ABNORMAL HIGH (ref 65–99)
Potassium: 4.7 mmol/L (ref 3.5–5.1)
SODIUM: 137 mmol/L (ref 135–145)

## 2017-11-25 LAB — CBC
HEMATOCRIT: 23.3 % — AB (ref 36.0–46.0)
Hemoglobin: 7.4 g/dL — ABNORMAL LOW (ref 12.0–15.0)
MCH: 22 pg — ABNORMAL LOW (ref 26.0–34.0)
MCHC: 31.8 g/dL (ref 30.0–36.0)
MCV: 69.3 fL — AB (ref 78.0–100.0)
Platelets: 434 10*3/uL — ABNORMAL HIGH (ref 150–400)
RBC: 3.36 MIL/uL — AB (ref 3.87–5.11)
RDW: 16.1 % — AB (ref 11.5–15.5)
WBC: 9.9 10*3/uL (ref 4.0–10.5)

## 2017-11-25 MED ORDER — SODIUM CHLORIDE 0.9 % IV SOLN
Freq: Once | INTRAVENOUS | Status: AC
  Administered 2017-11-25: 17:00:00 via INTRAVENOUS

## 2017-11-25 MED ORDER — ENOXAPARIN (LOVENOX) PATIENT EDUCATION KIT
PACK | Freq: Once | Status: AC
  Administered 2017-11-25: 16:00:00
  Filled 2017-11-25: qty 1

## 2017-11-25 NOTE — Progress Notes (Signed)
1 Day Post-Op Procedure(s) (LRB): EXPLORATORY LAPAROTOMY (N/A) TOTAL HYSTERECTOMY ABDOMINALGREATER 250 GRAMS (N/A) BIJLATERAL SALPINGO OOPHORECTOMY (Bilateral) OMENTECTOMY (N/A) RADICAL TUMOR DEBULKING (N/A)  Subjective: Patient reports feeling well.  Tolerating potato soup this am with no nausea or emesis reported.  Pain controlled with PRN medications.  Sat on the side of the bed but became dizzy.  Emotional because she just spoke with her brother who she had not spoken with in a long time.  No concerns voiced. Operative findings discussed with patient by Dr. Denman George.  Questions answered about prognosis.    Objective: Vital signs in last 24 hours: Temp:  [98 F (36.7 C)-99.9 F (37.7 C)] 99 F (37.2 C) (03/22 1015) Pulse Rate:  [89-109] 109 (03/22 1015) Resp:  [16-26] 18 (03/22 1015) BP: (130-179)/(67-128) 148/67 (03/22 1015) SpO2:  [91 %-100 %] 97 % (03/22 1015) Arterial Line BP: (191-198)/(78-85) 198/81 (03/21 1830) Weight:  [251 lb 1.7 oz (113.9 kg)] 251 lb 1.7 oz (113.9 kg) (03/22 0500) Last BM Date: 11/24/17  Intake/Output from previous day: 03/21 0701 - 03/22 0700 In: 3850 [P.O.:350; I.V.:3500] Out: 750 [Urine:350; Blood:400]  Physical Examination: General: alert, cooperative and no distress Resp: clear to auscultation bilaterally Cardio: mildly tachycardic GI: incision: midline op site dressing in place with no drainage present and abdomen morbidly obese, hypoactive bowel sounds, soft Extremities: extremities normal, atraumatic, no cyanosis or edema  Labs: WBC/Hgb/Hct/Plts:  9.9/7.4/23.3/434 (03/22 0439) BUN/Cr/glu/ALT/AST/amyl/lip:  23/1.49/--/--/--/--/-- (03/22 0439)  Assessment: 62 y.o. s/p Procedure(s): EXPLORATORY LAPAROTOMY TOTAL HYSTERECTOMY ABDOMINALGREATER 250 GRAMS BIJLATERAL SALPINGO OOPHORECTOMY OMENTECTOMY RADICAL TUMOR DEBULKING: stable Pain:  Pain is well-controlled on PRN medications.  Heme: Hgb 7.4 and Hct 23.3 this am.  Plan for transfusion of  1 unit of PRBC due to symptomatic anemia.  CV: Mildly tachycardic. BP stable.  Plan to transfuse.    GI:  Tolerating po: Yes. Antiemetics ordered PRN.  GU: Foley in place. Urine output 350 cc overnight. Foley to be removed. Continue to monitor output.    FEN: Stable post-operatively.  Prophylaxis: pharmacologic prophylaxis (with any of the following: enoxaparin (Lovenox) '40mg'$  SQ 2 hours prior to surgery then every day) and intermittent pneumatic compression boots.  Plan: Plan for one unit of PRBCs per Dr. Denman George Discontinue foley and continue to monitor output Diet to regular Lovenox teaching kit Encourage ambulation, IS use, deep breathing, and coughing Continue post-operative plan of care per Dr. Denman George Appointment with Dr. Alvy Bimler will be arranged outpatient   LOS: 1 day    Danija Gosa D Zakia Sainato 11/25/2017, 12:28 PM

## 2017-11-25 NOTE — Progress Notes (Signed)
PT Cancellation Note  Patient Details Name: Robyn Guerra MRN: [id number] DOB: [birth year]   Cancelled Treatment:    Reason Eval/Treat Not Completed: Attempted PT eval. Family and pt declined participation with PT at this time. Will check back as schedule permits. Thanks.    Weston Anna, MPT Pager: [phone]

## 2017-11-26 LAB — BASIC METABOLIC PANEL
Anion gap: 7 (ref 5–15)
BUN: 22 mg/dL — ABNORMAL HIGH (ref 6–20)
CALCIUM: 8.1 mg/dL — AB (ref 8.9–10.3)
CO2: 24 mmol/L (ref 22–32)
Chloride: 103 mmol/L (ref 101–111)
Creatinine, Ser: 1.44 mg/dL — ABNORMAL HIGH (ref 0.44–1.00)
GFR, EST AFRICAN AMERICAN: 45 mL/min — AB (ref >60)
GFR, EST NON AFRICAN AMERICAN: 39 mL/min — AB (ref >60)
Glucose, Bld: 125 mg/dL — ABNORMAL HIGH (ref 65–99)
Potassium: 4.1 mmol/L (ref 3.5–5.1)
SODIUM: 134 mmol/L — AB (ref 135–145)

## 2017-11-26 LAB — CBC
HCT: 22.7 % — ABNORMAL LOW (ref 36.0–46.0)
Hemoglobin: 7.4 g/dL — ABNORMAL LOW (ref 12.0–15.0)
MCH: 23.3 pg — ABNORMAL LOW (ref 26.0–34.0)
MCHC: 32.6 g/dL (ref 30.0–36.0)
MCV: 71.6 fL — ABNORMAL LOW (ref 78.0–100.0)
Platelets: 366 10*3/uL (ref 150–400)
RBC: 3.17 MIL/uL — AB (ref 3.87–5.11)
RDW: 17.2 % — ABNORMAL HIGH (ref 11.5–15.5)
WBC: 10.8 10*3/uL — AB (ref 4.0–10.5)

## 2017-11-26 MED ORDER — POTASSIUM CHLORIDE 10 MEQ/100ML IV SOLN
INTRAVENOUS | Status: AC
  Filled 2017-11-26: qty 100

## 2017-11-26 NOTE — Progress Notes (Signed)
2 Days Post-Op Procedure(s) (LRB): EXPLORATORY LAPAROTOMY (N/A) TOTAL HYSTERECTOMY ABDOMINALGREATER 250 GRAMS (N/A) BIJLATERAL SALPINGO OOPHORECTOMY (Bilateral) OMENTECTOMY (N/A) RADICAL TUMOR DEBULKING (N/A)  Subjective: No N/V or flatus.  Small BM yesterday.  No other complaints.    Objective: Vital signs in last 24 hours: Temp:  [98.9 F (37.2 C)-100.3 F (37.9 C)] 98.9 F (37.2 C) (03/23 0621) Pulse Rate:  [84-110] 84 (03/23 0621) Resp:  [16-24] 16 (03/23 0621) BP: (110-148)/(61-99) 145/80 (03/23 0621) SpO2:  [94 %-100 %] 100 % (03/23 0621) Weight:  [263 lb 10.7 oz (119.6 kg)] 263 lb 10.7 oz (119.6 kg) (03/23 0621) Last BM Date: 11/26/17  Intake/Output from previous day: 03/22 0701 - 03/23 0700 In: 3413.7 [P.O.:840; I.V.:2211.7; Blood:362] Out: 665 [Urine:575]  Physical Examination: General: alert, cooperative and no distress Resp: clear to auscultation bilaterally Cardio: regular rate, rhythm GI: incision: midline op site dressing in place with dried heme present and  Softly distended, NT Extremities: extremities normal, atraumatic, no cyanosis or edema  Labs: WBC/Hgb/Hct/Plts:  10.8/7.4/22.7/366 (03/23 0446) BUN/Cr/glu/ALT/AST/amyl/lip:  22/1.44/--/--/--/--/-- (03/23 0446)  Assessment: 62 y.o. s/p Procedure(s): EXPLORATORY LAPAROTOMY TOTAL HYSTERECTOMY ABDOMINALGREATER 250 GRAMS BIJLATERAL SALPINGO OOPHORECTOMY OMENTECTOMY RADICAL TUMOR DEBULKING: stable Pain:  Pain is well-controlled on PRN medications.  Heme: Hgb 7.4 this am post transfusion. Likely dilutional/ 2/2 mobilization of third-space fluid.  Asymptomatic  CV: Hemodynamically stable  GI:  Tolerating po: Yes.   GU: Adequate u.o.    FEN: Electrolytes in range  Prophylaxis: pharmacologic prophylaxis (with any of the following: enoxaparin (Lovenox) 40mg  SQ 2 hours prior to surgery then every day) and intermittent pneumatic compression boots.  Plan:  Saline lock Iv Encourage ambulation, IS use,  deep breathing, and coughing Anticipate D/C home tomorrow   LOS: 2 days    [name] 11/26/2017, 9:21 AM

## 2017-11-26 NOTE — Evaluation (Signed)
Physical Therapy One Time Evaluation Patient Details Name: Robyn Guerra MRN: [id number] DOB: 09-27-1955 Today's Date: 11/26/2017   History of Present Illness  Pt is a 62 year old female s/p Exploratory laparotomy with total abdominal hysterectomy, bilateral salpingo-oophorectomy, omentectomy radical tumor debulking for metastatic endometrial cancer on 11/24/17  Clinical Impression  Patient evaluated by Physical Therapy with no further acute PT needs identified. All education has been completed and the patient has no further questions.  Pt reports ambulating in hallway earlier and very willing to mobilize again.  Pt ambulating well without assistive device and no deficits observed.  Pt plans to take her time and progress slowly.  Pt to d/c home with her sister and will have family assist if needed.  Pt reports plan for d/c tomorrow. No follow-up Physical Therapy or equipment needs. PT is signing off. Thank you for this referral.    Follow Up Recommendations No PT follow up    Equipment Recommendations  None recommended by PT    Recommendations for Other Services       Precautions / Restrictions Precautions Precautions: None      Mobility  Bed Mobility Overal bed mobility: Modified Independent                Transfers Overall transfer level: Modified independent                  Ambulation/Gait Ambulation/Gait assistance: Supervision;Modified independent (Device/Increase time) Ambulation Distance (Feet): 400 Feet Assistive device: None Gait Pattern/deviations: Step-through pattern     General Gait Details: slow but steady gait, no symptoms reported  Stairs            Wheelchair Mobility    Modified Rankin (Stroke Patients Only)       Balance Overall balance assessment: No apparent balance deficits (not formally assessed)                                           Pertinent Vitals/Pain Pain Assessment: 0-10 Pain Score: 6   Pain Location: abdomen "feels bloated" Pain Descriptors / Indicators: Sore Pain Intervention(s): Limited activity within patient's tolerance;Repositioned;Monitored during session;Premedicated before session    Home Living Family/patient expects to be discharged to:: Private residence Living Arrangements: Alone Available Help at Discharge: Family;Available 24 hours/day Type of Home: House       Home Layout: Two level Home Equipment: None Additional Comments: pt reports d/c home with her sister, family to assist    Prior Function Level of Independence: Independent               Hand Dominance        Extremity/Trunk Assessment        Lower Extremity Assessment Lower Extremity Assessment: Overall WFL for tasks assessed    Cervical / Trunk Assessment Cervical / Trunk Assessment: Normal  Communication   Communication: No difficulties  Cognition Arousal/Alertness: Awake/alert Behavior During Therapy: WFL for tasks assessed/performed Overall Cognitive Status: Within Functional Limits for tasks assessed                                        General Comments      Exercises     Assessment/Plan    PT Assessment Patent does not need any further PT services  PT Problem List  PT Treatment Interventions      PT Goals (Current goals can be found in the Care Plan section)  Acute Rehab PT Goals PT Goal Formulation: All assessment and education complete, DC therapy    Frequency     Barriers to discharge        Co-evaluation               AM-PAC PT "6 Clicks" Daily Activity  Outcome Measure Difficulty turning over in bed (including adjusting bedclothes, sheets and blankets)?: None Difficulty moving from lying on back to sitting on the side of the bed? : None Difficulty sitting down on and standing up from a chair with arms (e.g., wheelchair, bedside commode, etc,.)?: None Help needed moving to and from a bed to chair  (including a wheelchair)?: None Help needed walking in hospital room?: None Help needed climbing 3-5 steps with a railing? : A Little 6 Click Score: 23    End of Session   Activity Tolerance: Patient tolerated treatment well Patient left: in bed;with call bell/phone within reach;with family/visitor present Nurse Communication: Mobility status PT Visit Diagnosis: Difficulty in walking, not elsewhere classified (R26.2)    Time: 1449-1500 PT Time Calculation (min) (ACUTE ONLY): 11 min   Charges:   PT Evaluation $PT Eval Low Complexity: 1 Low     PT G CodesCarmelia Bake, PT, DPT 11/26/2017 Pager: [phone]   York Ram E 11/26/2017, 3:07 PM

## 2017-11-27 LAB — CBC
HEMATOCRIT: 22.8 % — AB (ref 36.0–46.0)
Hemoglobin: 7.3 g/dL — ABNORMAL LOW (ref 12.0–15.0)
MCH: 22.9 pg — ABNORMAL LOW (ref 26.0–34.0)
MCHC: 32 g/dL (ref 30.0–36.0)
MCV: 71.5 fL — AB (ref 78.0–100.0)
Platelets: 391 10*3/uL (ref 150–400)
RBC: 3.19 MIL/uL — AB (ref 3.87–5.11)
RDW: 17.1 % — ABNORMAL HIGH (ref 11.5–15.5)
WBC: 9.8 10*3/uL (ref 4.0–10.5)

## 2017-11-27 LAB — BASIC METABOLIC PANEL
Anion gap: 8 (ref 5–15)
BUN: 18 mg/dL (ref 6–20)
CHLORIDE: 103 mmol/L (ref 101–111)
CO2: 23 mmol/L (ref 22–32)
Calcium: 8.3 mg/dL — ABNORMAL LOW (ref 8.9–10.3)
Creatinine, Ser: 1.25 mg/dL — ABNORMAL HIGH (ref 0.44–1.00)
GFR, EST AFRICAN AMERICAN: 53 mL/min — AB (ref >60)
GFR, EST NON AFRICAN AMERICAN: 46 mL/min — AB (ref >60)
Glucose, Bld: 101 mg/dL — ABNORMAL HIGH (ref 65–99)
POTASSIUM: 4.2 mmol/L (ref 3.5–5.1)
SODIUM: 134 mmol/L — AB (ref 135–145)

## 2017-11-27 LAB — PREPARE RBC (CROSSMATCH)

## 2017-11-27 MED ORDER — SODIUM CHLORIDE 0.9 % IV SOLN
Freq: Once | INTRAVENOUS | Status: AC
  Administered 2017-11-27: 14:00:00 via INTRAVENOUS

## 2017-11-27 MED ORDER — ENOXAPARIN SODIUM 40 MG/0.4ML ~~LOC~~ SOLN
40.0000 mg | SUBCUTANEOUS | 0 refills | Status: AC
Start: 2017-11-28 — End: 2017-12-23

## 2017-11-27 MED ORDER — ACETAMINOPHEN 500 MG PO TABS
1000.0000 mg | ORAL_TABLET | Freq: Four times a day (QID) | ORAL | Status: DC
  Administered 2017-11-27: 1000 mg via ORAL
  Filled 2017-11-27: qty 2

## 2017-11-27 MED ORDER — OXYCODONE-ACETAMINOPHEN 5-325 MG PO TABS: 1.0000 | ORAL_TABLET | ORAL | 0 refills | Status: DC | PRN

## 2017-11-27 MED ORDER — ENSURE ENLIVE PO LIQD
237.0000 mL | Freq: Two times a day (BID) | ORAL | Status: DC
  Administered 2017-11-27: 237 mL via ORAL

## 2017-11-27 MED ORDER — OXYCODONE HCL 5 MG PO TABS: 10.0000 mg | ORAL_TABLET | ORAL | Status: DC | PRN

## 2017-11-27 NOTE — Progress Notes (Signed)
3 Days Post-Op Procedure(s) (LRB): EXPLORATORY LAPAROTOMY (N/A) TOTAL HYSTERECTOMY ABDOMINALGREATER 250 GRAMS (N/A) BIJLATERAL SALPINGO OOPHORECTOMY (Bilateral) OMENTECTOMY (N/A) RADICAL TUMOR DEBULKING (N/A)  Subjective: No N/V. + flatus. Decreased appetite.  Incisional pain yesterday/less today.   Objective: Vital signs in last 24 hours: Temp:  [97.6 F (36.4 C)-99 F (37.2 C)] 97.6 F (36.4 C) (03/24 0558) Pulse Rate:  [87-104] 87 (03/24 0558) Resp:  [16] 16 (03/24 0558) BP: (127-159)/(69-88) 127/69 (03/24 0558) SpO2:  [94 %-99 %] 96 % (03/24 0558) Weight:  [256 lb (116.1 kg)] 256 lb (116.1 kg) (03/24 0558) Last BM Date: 11/24/17  Intake/Output from previous day: 03/23 0701 - 03/24 0700 In: 640 [P.O.:240; I.V.:400] Out: 500 [Urine:500]  Physical Examination: General: alert, cooperative and no distress Resp: clear to auscultation bilaterally Cardio: regular rate, rhythm GI: incision: midline op site dressing in place with dried heme present and  Soft, non distended, NT Extremities: extremities normal, atraumatic, no cyanosis or edema  Labs: WBC/Hgb/Hct/Plts:  9.8/7.3/22.8/391 (03/24 0401) BUN/Cr/glu/ALT/AST/amyl/lip:  18/1.25/--/--/--/--/-- (03/24 0401)  Assessment: 62 y.o. s/p Procedure(s): EXPLORATORY LAPAROTOMY TOTAL HYSTERECTOMY ABDOMINALGREATER 250 GRAMS BIJLATERAL SALPINGO OOPHORECTOMY OMENTECTOMY RADICAL TUMOR DEBULKING: stable Pain:  Pain is well-controlled on PRN medications.  Heme: Hgb 7.3 this am, slight downward trend.  Asymptomatic.  CV: Hemodynamically stable  GI:  Tolerating po: Yes.   FEN: Electrolytes remain in range  Prophylaxis: pharmacologic prophylaxis (with any of the following: enoxaparin (Lovenox) 40mg  SQ 2 hours prior to surgery then every day) and intermittent pneumatic compression boots.  Plan: After further discussion, the patient elects to proceed with transfusion of 1 additional unit of PRBC.  She accepts the risks of a  possible reaction or transmission of a blood born pathogen Saline lock Iv Encourage ambulation, IS use, deep breathing, and coughing Anticipate D/C home later today   LOS: 3 days    Robyn Guerra 11/27/2017, 10:11 AM

## 2017-11-27 NOTE — Discharge Summary (Signed)
Physician Discharge Summary  Patient ID: Robyn Guerra MRN: [id number] DOB/AGE: 1956-03-17 62 y.o.  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   The postoperative course was remarkable for worsening of chronic anemia 2/2 to acute blood loss.  She was transfused 2 units PRBC.  She remained hemodynamically stable.  She was discharged to home on postoperative day 3 tolerating a regular diet and meeting all postoperative goals.  Consults: None  Significant Diagnostic Studies: labs: CBC, basic metabolic profile  Treatments: surgery: see above  Discharge Exam: Blood pressure 129/83, pulse 88, temperature 99 F (37.2 C), temperature source Oral, resp. rate 18, height 5\' 4"  (1.626 m), weight 256 lb (116.1 kg), last menstrual period 04/20/2016, SpO2 97 %. General appearance: alert Resp: clear to auscultation bilaterally Cardio: regular rate and rhythm, S1, S2 normal, no murmur, click, rub or gallop GI: soft, non-tender; bowel sounds normal; no masses,  no organomegaly Extremities: extremities normal, atraumatic, no cyanosis or edema Incision/Wound: C/D/I  Disposition: Discharge disposition: 01-Home or Self Care       Discharge Instructions    Activity as tolerated - No restrictions   Complete by:  As directed    Call MD for:  extreme fatigue   Complete by:  As directed    Call MD for:  persistant dizziness or light-headedness   Complete  by:  As directed    Call MD for:  persistant nausea and vomiting   Complete by:  As directed    Call MD for:  redness, tenderness, or signs of infection (pain, swelling, redness, odor or green/yellow discharge around incision site)   Complete by:  As directed    Call MD for:  severe uncontrolled pain   Complete by:  As directed    Call MD for:  temperature >100.4   Complete by:  As directed    Diet - low sodium heart healthy   Complete by:  As directed    Diet Carb Modified   Complete by:  As directed    Discharge instructions   Complete by:  As directed    Return to work: 6 weeks  Activity: 1. Be up and out of the bed during the day.  Take a nap if needed.  You may walk up steps but be careful and use the hand rail.  Stair climbing will tire you more than you think, you may need to stop part way and rest.   2. No lifting or straining for 6 weeks.  3. No driving for 1-2 weeks.  Do Not drive if you are taking narcotic pain medicine.  4. Shower daily.  Use soap and water on your incision and pat dry; don't rub.   5. No sexual activity and nothing in the vagina for 4 weeks.  Diet: 1. Low sodium Heart Healthy Diet is recommended.  2. It is safe to use a laxative if you have difficulty moving your bowels.   Wound Care: 1. Keep clean and dry.  Shower daily.  Reasons to call the Doctor:  Fever - Oral temperature greater than  100.4 degrees Fahrenheit Foul-smelling vaginal discharge Difficulty urinating Nausea and vomiting Increased pain at the site of the incision that is unrelieved with pain medicine. Difficulty breathing with or without chest pain New calf pain especially if only on one side Sudden, continuing increased vaginal bleeding with or without clots.   Follow-up: 1. See Everitt Amber in 4 weeks.  Contacts: For questions or concerns you should contact:  Dr. Everitt Amber at [phone]  or at [place]   Discharge wound care:   Complete by:  As  directed    Keep clean and dry   Driving Restrictions   Complete by:  As directed    No driving for 1- 2 weeks   Increase activity slowly   Complete by:  As directed    Lifting restrictions   Complete by:  As directed    No lifting > 5 lbs for 6 weeks   May shower / Bathe   Complete by:  As directed    No tub baths for 6 weeks   Sexual Activity Restrictions   Complete by:  As directed    No intercourse for 6 - 8 weeks     Allergies as of 11/27/2017      Reactions   Codeine Nausea And Vomiting   Room spins      Medication List    STOP taking these medications   megestrol 40 MG tablet Commonly known as:  MEGACE   traMADol 50 MG tablet Commonly known as:  ULTRAM     TAKE these medications   acetaminophen 500 MG tablet Commonly known as:  TYLENOL Take 1,500 mg by mouth every 6 (six) hours as needed for moderate pain.   BIOTIN PO Take 1 tablet by mouth daily.   diphenhydramine-acetaminophen 25-500 MG Tabs tablet Commonly known as:  TYLENOL PM Take 1 tablet by mouth at bedtime as needed (sleep).   enoxaparin 40 MG/0.4ML injection Commonly known as:  LOVENOX Inject 0.4 mLs (40 mg total) into the skin daily for 25 days. Start taking on:  11/28/2017   hydrochlorothiazide 25 MG tablet Commonly known as:  HYDRODIURIL Take 1 tablet (25 mg total) by mouth daily.   ibuprofen 600 MG tablet Commonly known as:  ADVIL,MOTRIN Take 1 tablet (600 mg total) by mouth every 6 (six) hours as needed. What changed:  reasons to take this   oxyCODONE-acetaminophen 5-325 MG tablet Commonly known as:  PERCOCET/ROXICET Take 1-2 tablets by mouth every 4 (four) hours as needed for severe pain.            Discharge Care Instructions  (From admission, onward)        Start     Ordered   11/27/17 0000  Discharge wound care:    Comments:  Keep clean and dry   11/27/17 1035       Signed: [name] 11/27/2017, 5:01 PM

## 2017-11-27 NOTE — Discharge Instructions (Signed)
Planning for °Recovery and °Going Home °Your Guide to Gynecologic Surgery   ° ° °In-Hospital Recovery Plan °Team Caring for You After Surgery °In addition to the nursing staff on the unit, the gynecological surgery team will °care for you. This team is led by your surgeon and includes medical students and a °physician assistant or nurse practitioner. There will be a physician in the °hospital 24 hours a day to tend to your needs. The students °report directly to your surgeon, who is the one overseeing all of your care. ° °Pain Relief After Surgery °Your pain will be assessed regularly on a scale from 0 to 10. Pain assessment is  °necessary to guide your pain relief. It is essential that you are able to take deep °breaths, cough and move. Prevention or early treatment of pain is far more °effective than trying to treat severe pain. Therefore, we have devised a specialized °regimen to stay ahead of your pain and use almost no narcotics, which can slow °down your recovery process. If you have an epidural catheter, you will receive a  °constant infusion of pain medication through your epidural. If you need °additional pain relief, you will be able to push a button to increase the medication in °your epidural. You will also be given acetaminophen and an ibuprofen-like °medication to keep your pain under control.  You can always ask for additional pain pills if you are not comfortable. In most cases an anesthesiologist with expertise in pain management will visit you every day and help design your pain management plan. ° °One Day After Surgery °Focus on drinking and walking. You will start drinking clear liquids after °surgery. The intravenous fluids will be stopped, and the catheter may be removed  °from your bladder. We expect you to get out of bed, with the nurses' or assistants' °help, sit in a chair for six hours and start to move about in the hallways. You will °also meet with a case manager to assess your discharge  needs, including home °nursing. Your physician may order home care to assist with your transition home.  °Home nursing visits, which are intermittent, help you get readjusted to home by °teaching treatments, monitoring medications, and performing clinical assessment °and reporting back to your physician. Other services may include therapy and °medical equipment; private duty services are also available. If you are going °"home" to a different address upon discharge, please alert us. A Home Care °Coordinator can visit with you while in the hospital to discuss your options. If you °have questions please speak with your case manager. If you °need rehabilitation at a facility, a social worker will assist with this. If you need °rehabilitation at a facility, a social worker will assist with this. °If your procedure was performed in a minimally invasive fashion, you will be discharged to home if your pain is well controlled and you are tolerating a regular diet.   ° ° °Two Days After Surgery °You will start eating a soft diet and change to a more solid diet as you feel up to it. The catheter from your bladder will be removed, if not already done so. If there is a dressing on your wound, it will be removed. The tubing will be disconnected from your IV. °We expect you to be out of bed for the majority of the day and walking at least three times in the hallway, with assistance as needed.  You may be discharged at this point if it is felt you are   ready.  ° °Three Days After Surgery °You continue to eat your low residue diet. You may be ready to go home if you are °drinking enough to keep yourself hydrated, your pain is well controlled, you are not °belching or nauseated, you are passing gas and you are able to get around on your °own. However, we will not discharge you from the hospital until we are sure you are °ready. ° °Discharge °Discharge time is at 10 a.m. You will need to make arrangements for someone to °accompany you  home. You will not be released without someone present. °Please keep in mind that we strive to get patients discharged as quickly as possible, °but there may be delays for a variety of reasons. °Complications That May Delay Discharge: °? Nausea and vomiting: It is very common to feel sick after your surgery. We give °you medication to reduce this. However, if you do feel sick, you should reduce the °amount you are taking by mouth. Small, frequent meals or drinks are best in this  °situation. As long as you can drink and keep yourself hydrated, the nausea will likely °pass.  °Ileus: Following surgery, the bowel can be sluggish, making it difficult for food °and gas to pass through the intestines. This is called an ileus. We have designed our °care program to do everything possible to reduce the likelihood of an ileus. If you do °develop an ileus, it usually only lasts two to three days. However, it may require a °small tube down the nose to decompress the stomach. The best way to avoid an °ileus is to reduce the amount of narcotic pain medications, get up as °much as possible after your surgery, and stimulate the bowel early after °surgery with small amounts of food and liquids. ° Wound infection: If a wound infection develops, this usually happens three °to ten days after surgery.  ° ° Urinary retention: This is if you are unable to urinate after the catheter °from your bladder is removed. The catheter may need to be reinserted until °you are able to urinate on your own. This can be caused by anesthesia, pain °medication and decreased activity.  ° ° °When you are preparing to go home, you will receive: ° Detailed discharge instructions, with information about your operation °and medications  ° ° All prescriptions for medications you need at home; prescriptions can be °filled while you are in the hospital if you would like  ° ° You may be prescribed Lovenox. Lovenox is used to reduce the risk of °developing a blood  clot after surgery. °An appointment to see your surgeon or provider one to two weeks after you °leave the hospital for follow-up  ° °After Discharge °Once you are discharged: °Call us at any time if you are worried about your recovery or if °you should have any questions. °During regular office hours, (8:30 a.m.-4:30 p.m.), and after hours call [phone]. ° °Call us immediately if: ° You have a fever higher than 100.4 degrees.  ° °Your wound is red, more painful or has drainage.  ° ° You are nauseated, vomiting or can't keep liquids down.  ° ° Your pain is worse and not able to be controlled with the regimen you were °sent home with.  ° ° If you are bleeding heavily or have a lot of fluid coming from your vagina. °If you are on narcotics, the goal is to wean you off of them. If you are running low °on supply and need more,   call the nurse a few days before you will run out.  °It is generally easier to reach someone between 8:30 a.m. - 4:30 p.m., so call °early if you think something is not right. A nurse or nurse practitioner is °available every day to answer your questions. After hours and on the °weekends, the calls go to the resident doctors in the hospital. It may take °longer for your phone call to be returned during this time. °If you have a true emergency, such as severe abdominal pain, chest pain, °shortness of breath or any other acute issues, call 911 and go to the local °emergency room. Have them contact our team once you are stable. ° °Concerns After Discharge °Bowel Function Following Your Surgery °Your bowels will take several weeks to settle down and may be unpredictable °at first. Your bowel movements may become loose, or you may be constipated. °For the vast number of patients, this will get back to normal with time. Make °sure you eat nutritious meals, drink plenty of fluids and take regular walks °during the first two weeks after your operation. °Your Guide to Gynecologic Surgery   ° °Abdominal  Pain °It is not unusual to suffer gripping pains (colic) during the first week following °removal of a portion of your bowel. This pain usually lasts for a few minutes °but goes away between spasms. If you have severe pain lasting more than one °to two hours or have a fever and feel generally unwell, you should contact us at  °the telephone contact numbers listed at the end of this packet. °Hysterectomy: °You should have pelvic rest for six (6) weeks or as specified by your doctor °after surgery. You should have nothing in the vagina (no tampons, douching, °intercourse, etc.,) during this time period. °If you have some vaginal spotting, this is normal. If you have heavy bleeding or  °a lot of fluid from your vagina, this is NOT normal and you should contact your °doctor's office or, if after hours, contact the doctor on call. ° °Diarrhea: Fiber and Imodium (Loperamide) °The first step to improving your frequent or loose stools is to bulk up the stool °with fiber. Metamucil is the most common type of fiber that is available at any °drug store. Start with 1 teaspoon mixed into food, like yogurt or oatmeal, in °the morning and evening. Try not to drink any fluid for one hour after you take °the fiber. This will allow the fiber to act like a sponge in your intestines, °soaking up all the excess water. Continue this for three to five days. °You may increase by 1 teaspoon every three to five days until the desired affect, °or you are at 1 tablespoon (3 teaspoons) twice a day. If this doesn't work, you °may try over-the-counter Loperamide, which is an antidiarrheal medication. You °may take one tablet in the morning and evening or 30 minutes before you °typically have diarrhea. You may take up to eight of these tablets daily. It is best °to discuss this with us prior to using this medication. If you have continuous °diarrhea and abdominal cramping, call [phone]. ° °Foley Catheter °Your surgeon may recommend you be  discharged home with a foley catheter °(bladder catheter) for 1 to 2 weeks. Typically this recommendation will be °made for patients undergoing surgery to the lower urinary tract. Before you leave the hospital, your nurse should outfit you with a clip on the inner thigh to secure the catheter to prevent pulling as well as a   small bag that can be easily worn on the upper leg under loose fitting pants and skirts. Your nurse will teach you how to exchange the large bag that typically comes with the catheter for the small bag. You may find it °convenient to attach the small bag when active during the day and then the °large bag when sleeping at night. If there is ever a point when you notice the catheter is not draining urine and youbegin to develop pain behind/above the pubic bone, you should report to the clinic or emergency room immediately as the catheter may be kinked or clogged. Kinking or clogging of the catheter prevents urine from draining °from your bladder. Urine will quickly build up in the bladder and can cause °severe pain as well as seriously disrupt healing if you have undergone surgery °on the lower urinary tract. Additionally, pulling on the catheter can result in °displacement of the balloon at the end of the catheter from inside of to outside  °of the bladder. This also results in severe pain and can cause bleeding. For °this reason, secure the catheter to the clip on your inner thigh at all times as the °clip prevents against pulling. ° °Wound Care °For the first few weeks following surgery, your wound may be slightly red and °uncomfortable. You may shower and let the soapy water wash over your incision. °Avoid soaking in the tub for one month following surgery or until °the wound is well healed. It will take the wound several months to °"soften." It is common to have bumpy areas in the wound near the belly  °button and at the ends of the incision. ° °If you have staples, these should be removed  when you are seen by your °surgeon at the follow-up appointment. You may have a glue-like material on °your incision. Do not pick at this. It will come off over time. It is the °surgical glue used in surgery to close your incision. You also have sutures °inside of you that will dissolve over time ° °Post-Surgery Diet °Attention to good nutrition after surgery is important to your recovery. If °you had no dietary restrictions prior to the surgery, you will have no °special dietary restrictions after the surgery. However, consuming enough °protein, calories, vitamins and minerals is necessary to support healing. °Some patients find their appetite is less than normal after surgery. In this °case, frequent small meals throughout the day may help. °It is not uncommon to lose 10 to 15 pounds after surgery. However, by the °fourth to fifth week, your weight loss should stabilize. °It is normal that certain foods taste different and certain smells may make °you nauseas. °Over time, the amount you can comfortably consume will gradually increase. °You should try to eat a balanced diet, which includes: ° Foods that are soft, moist, and easy to chew and swallow  ° ° Canned or soft-cooked fruits and vegetables  ° °Plenty of soft breads, rice, pasta, potatoes and other starchy foods (lowerfiber  °varieties may be tolerated better initially)  ° ° High-protein foods and beverages, such as meats, eggs, milk, cottage cheese  °or a supplemental nutrition drink like Boost or Ensure  ° ° Drink plenty of fluids-at least 8 to 10 cups per day. This includes water,  °fruit juice, Gatorade, teas/coffee and milk. Drinking plenty is especially °important if you have loose stools (diarrhea). ° ° Avoid drinking a lot of caffeine, since this may dehydrate you.  ° ° Avoid fried, greasy and highly seasoned   or spicy foods.  ° ° Avoid carbonated beverages in the first couple weeks.  ° ° Avoid raw fruits and vegetables.  ° °Hobbies/Activities °Walking  is encouraged after your surgery. You should plan to undertake °regular exercise several times a day and gradually increase this during the °four weeks following your operation until you are back to your normal level °of activity. You may climb stairs. Don't do any heavy lifting greater than 10 °pounds or contact sports for the first month after your surgery. °Generally, you can return to hobbies and activities soon after your surgery. °This will help you recover. °It can take up to two to three months to fully recover. It is not unusual to be °fatigued and require an afternoon nap for up to six to eight weeks following °surgery. Your body is using this energy to heal your wounds. Set small goals °for yourself and try to do a little more each day. ° °Work °It is normal to return to work three to six weeks following your operation. If °your job involves heavy manual work, then you should wait six weeks. °However, you should check with your employer regarding rules, which may °be relevant to your return to work. If you need a return-to-work form for your °employer or disability papers, bring them to your follow-up appointment or °fax them to our office at [phone]. ° °Driving °You may drive when you are off narcotics and pain-free enough to react °quickly with your braking foot. For most patients, this occurs at one to four °weeks following surgery. ° ° °Write down any questions you may have to ask your care team. ° °Important Contact Numbers: °GYN Oncology Office: [phone] °

## 2017-11-28 LAB — TYPE AND SCREEN
ABO/RH(D): B NEG
Antibody Screen: NEGATIVE
Unit division: 0
Unit division: 0

## 2017-11-28 LAB — BPAM RBC
BLOOD PRODUCT EXPIRATION DATE: 201904052359
Blood Product Expiration Date: 201904062359
ISSUE DATE / TIME: 201903221710
ISSUE DATE / TIME: 201903241358
UNIT TYPE AND RH: 1700
Unit Type and Rh: 1700

## 2017-12-01 ENCOUNTER — Encounter: Payer: Self-pay | Admitting: Gynecologic Oncology

## 2017-12-01 ENCOUNTER — Telehealth: Payer: Self-pay | Admitting: *Deleted

## 2017-12-01 NOTE — Telephone Encounter (Signed)
Called and gave the patient the appt for Dr. Alvy Bimler on April 5th at 2pm, arrive at 1:45pm,

## 2017-12-01 NOTE — Progress Notes (Signed)
[facility]I ordered per Dr. Alvy Bimler on surgical specimen from 11/24/2017 with Suanne Marker at [facility].

## 2017-12-05 NOTE — Anesthesia Postprocedure Evaluation (Signed)
Anesthesia Post Note  Patient: Robyn Guerra  Procedure(s) Performed: EXPLORATORY LAPAROTOMY (N/A ) TOTAL HYSTERECTOMY ABDOMINALGREATER 250 GRAMS (N/A Abdomen) BIJLATERAL SALPINGO OOPHORECTOMY (Bilateral ) OMENTECTOMY (N/A Abdomen) RADICAL TUMOR DEBULKING (N/A )     Patient location during evaluation: PACU Anesthesia Type: General Level of consciousness: awake and alert Pain management: pain level controlled Vital Signs Assessment: post-procedure vital signs reviewed and stable Respiratory status: spontaneous breathing, nonlabored ventilation, respiratory function stable and patient connected to nasal cannula oxygen Cardiovascular status: blood pressure returned to baseline and stable Postop Assessment: no apparent nausea or vomiting Anesthetic complications: no    Last Vitals:  Vitals:   11/27/17 1420 11/27/17 1713  BP: 129/83 136/81  Pulse: 88 90  Resp: 18 20  Temp: 37.2 C 37.3 C  SpO2: 97% 95%    Last Pain:  Vitals:   11/27/17 1713  TempSrc: Oral  PainSc:    Pain Goal: Patients Stated Pain Goal: 2 (11/26/17 2140)               Lyndle Herrlich EDWARD

## 2017-12-07 ENCOUNTER — Telehealth: Payer: Self-pay

## 2017-12-07 NOTE — Telephone Encounter (Signed)
Attempted to return patient's call from ~1530 today in follow up of call this am when Pt was very SOB on phone and in pain in upper breast and above incisions.  She also stated that she had pain when taking a deep breath. Onset with in the last 48 hours occurring intermittently. She is experiencing  N/V the last couple of days.  Difficult to eat. She has not moved her bowels in a couple of days. Has decreased the pain medication frequency due to constipation.  Pt noticed some bright red blood with  Most recent BM.  Stool hard and pt may have internal  Hemorrhoids.  Family member who is a nurse suggested that she not take the Lovenox injection as this may be causing her to bleed. Pt reports  she held a dose of lovexox in the last 2 days. Recommendation was for pt to to to [facility] ED to be evaluated as she may have a blood clot in lung.  Pt had not taken her percocet in a while and wanted to try this intervention.  Pt could take pain medication but Joylene John, NP sill recommended that she be evaluated for a blood clot as she was at a high risk for this occurring post operatively.  Pt left message at 1530 that she wanted to speak with nurse as she really did not want to go to the ED.  Left message in pt.'s VM that she should seek medical attention at the [facility] ED to be evaluated for blood clot.

## 2017-12-08 ENCOUNTER — Telehealth: Payer: Self-pay | Admitting: Gynecologic Oncology

## 2017-12-08 ENCOUNTER — Other Ambulatory Visit: Payer: Self-pay | Admitting: Gynecologic Oncology

## 2017-12-08 DIAGNOSIS — T402X5A Adverse effect of other opioids, initial encounter: Principal | ICD-10-CM

## 2017-12-08 DIAGNOSIS — K5903 Drug induced constipation: Secondary | ICD-10-CM

## 2017-12-08 MED ORDER — POLYETHYLENE GLYCOL 3350 17 G PO PACK: 17.0000 g | PACK | Freq: Two times a day (BID) | ORAL | 0 refills | Status: AC

## 2017-12-08 MED ORDER — OXYCODONE-ACETAMINOPHEN 5-325 MG PO TABS: 1.0000 | ORAL_TABLET | ORAL | 0 refills | Status: DC | PRN

## 2017-12-08 MED ORDER — OXYCODONE-ACETAMINOPHEN 5-325 MG PO TABS: 1.0000 | ORAL_TABLET | ORAL | 0 refills | Status: AC | PRN

## 2017-12-08 MED ORDER — DOCUSATE SODIUM 100 MG PO CAPS: 100.0000 mg | ORAL_CAPSULE | Freq: Two times a day (BID) | ORAL | 0 refills | Status: AC

## 2017-12-08 NOTE — Telephone Encounter (Signed)
Called patient this am to follow up on her status.  She states she feels less pain this am compared with yesterday.  She states she has tried taking ibuprofen with little relief.  Reports significant abdominal pain at times but she had not been taking her percocet because her sister told her it would constipate her.  She has not been taking anything for her bowels and reports gas pains as well.  Voiding without difficulty.  She also states she stopped taking her lovenox because she had bleeding from her hemorrhoids and her sister advised her to stop taking it.  Her sister also told her yesterday to not go to the ED because she would just be waiting.  She denies shortness of breath or chest pain.  Advised that a refill would be sent in for her.  Bowel regimen discussed.  She is to follow up as scheduled tomorrow or sooner if needed.  She is to call with an update on her symptoms or for any questions or concerns.  Advised if her pain became very severe or shortness of breath/chest pain developed, to go to the emergency room.  All questions answered.  Patient advised to resume lovenox as well.

## 2017-12-08 NOTE — Progress Notes (Signed)
Original script printed.  Paper copy placed in shredder and resent electronically.

## 2017-12-09 ENCOUNTER — Inpatient Hospital Stay (HOSPITAL_BASED_OUTPATIENT_CLINIC_OR_DEPARTMENT_OTHER): Payer: BLUE CROSS/BLUE SHIELD | Admitting: Hematology and Oncology

## 2017-12-09 ENCOUNTER — Inpatient Hospital Stay (HOSPITAL_COMMUNITY)
Admission: AD | Admit: 2017-12-09 | Discharge: 2018-01-04 | DRG: 682 | Disposition: E | Payer: BLUE CROSS/BLUE SHIELD | Source: Ambulatory Visit | Attending: Pulmonary Disease | Admitting: Pulmonary Disease

## 2017-12-09 ENCOUNTER — Other Ambulatory Visit: Payer: Self-pay

## 2017-12-09 ENCOUNTER — Encounter: Payer: Self-pay | Admitting: Hematology and Oncology

## 2017-12-09 ENCOUNTER — Inpatient Hospital Stay: Payer: BLUE CROSS/BLUE SHIELD | Attending: Gynecologic Oncology | Admitting: Gynecologic Oncology

## 2017-12-09 ENCOUNTER — Encounter (HOSPITAL_COMMUNITY): Payer: Self-pay | Admitting: Gynecologic Oncology

## 2017-12-09 ENCOUNTER — Inpatient Hospital Stay: Payer: BLUE CROSS/BLUE SHIELD

## 2017-12-09 ENCOUNTER — Encounter: Payer: Self-pay | Admitting: Gynecologic Oncology

## 2017-12-09 VITALS — BP 121/98 | HR 113 | Temp 98.4°F | Resp 18 | Ht 64.0 in | Wt 242.8 lb

## 2017-12-09 VITALS — BP 108/70 | HR 109 | Temp 98.2°F | Resp 20 | Ht 64.0 in | Wt 242.0 lb

## 2017-12-09 DIAGNOSIS — Z4659 Encounter for fitting and adjustment of other gastrointestinal appliance and device: Secondary | ICD-10-CM

## 2017-12-09 DIAGNOSIS — N183 Chronic kidney disease, stage 3 (moderate): Secondary | ICD-10-CM | POA: Diagnosis present

## 2017-12-09 DIAGNOSIS — D62 Acute posthemorrhagic anemia: Secondary | ICD-10-CM | POA: Diagnosis present

## 2017-12-09 DIAGNOSIS — K5901 Slow transit constipation: Secondary | ICD-10-CM

## 2017-12-09 DIAGNOSIS — C541 Malignant neoplasm of endometrium: Secondary | ICD-10-CM

## 2017-12-09 DIAGNOSIS — N170 Acute kidney failure with tubular necrosis: Secondary | ICD-10-CM

## 2017-12-09 DIAGNOSIS — D500 Iron deficiency anemia secondary to blood loss (chronic): Secondary | ICD-10-CM | POA: Diagnosis present

## 2017-12-09 DIAGNOSIS — Z87891 Personal history of nicotine dependence: Secondary | ICD-10-CM

## 2017-12-09 DIAGNOSIS — Z6841 Body Mass Index (BMI) 40.0 and over, adult: Secondary | ICD-10-CM

## 2017-12-09 DIAGNOSIS — Z0189 Encounter for other specified special examinations: Secondary | ICD-10-CM

## 2017-12-09 DIAGNOSIS — F419 Anxiety disorder, unspecified: Secondary | ICD-10-CM | POA: Diagnosis not present

## 2017-12-09 DIAGNOSIS — N924 Excessive bleeding in the premenopausal period: Secondary | ICD-10-CM

## 2017-12-09 DIAGNOSIS — E860 Dehydration: Secondary | ICD-10-CM | POA: Diagnosis present

## 2017-12-09 DIAGNOSIS — Z90722 Acquired absence of ovaries, bilateral: Secondary | ICD-10-CM

## 2017-12-09 DIAGNOSIS — Z66 Do not resuscitate: Secondary | ICD-10-CM | POA: Diagnosis not present

## 2017-12-09 DIAGNOSIS — Z885 Allergy status to narcotic agent status: Secondary | ICD-10-CM

## 2017-12-09 DIAGNOSIS — I4891 Unspecified atrial fibrillation: Secondary | ICD-10-CM | POA: Diagnosis not present

## 2017-12-09 DIAGNOSIS — E8770 Fluid overload, unspecified: Secondary | ICD-10-CM | POA: Diagnosis not present

## 2017-12-09 DIAGNOSIS — Z515 Encounter for palliative care: Secondary | ICD-10-CM | POA: Diagnosis not present

## 2017-12-09 DIAGNOSIS — N179 Acute kidney failure, unspecified: Secondary | ICD-10-CM

## 2017-12-09 DIAGNOSIS — K567 Ileus, unspecified: Secondary | ICD-10-CM | POA: Diagnosis present

## 2017-12-09 DIAGNOSIS — Z9911 Dependence on respirator [ventilator] status: Secondary | ICD-10-CM

## 2017-12-09 DIAGNOSIS — R109 Unspecified abdominal pain: Secondary | ICD-10-CM | POA: Diagnosis present

## 2017-12-09 DIAGNOSIS — Z833 Family history of diabetes mellitus: Secondary | ICD-10-CM

## 2017-12-09 DIAGNOSIS — Z8042 Family history of malignant neoplasm of prostate: Secondary | ICD-10-CM

## 2017-12-09 DIAGNOSIS — C787 Secondary malignant neoplasm of liver and intrahepatic bile duct: Secondary | ICD-10-CM | POA: Diagnosis present

## 2017-12-09 DIAGNOSIS — R1110 Vomiting, unspecified: Secondary | ICD-10-CM

## 2017-12-09 DIAGNOSIS — Z800 Family history of malignant neoplasm of digestive organs: Secondary | ICD-10-CM

## 2017-12-09 DIAGNOSIS — C55 Malignant neoplasm of uterus, part unspecified: Secondary | ICD-10-CM | POA: Diagnosis not present

## 2017-12-09 DIAGNOSIS — G893 Neoplasm related pain (acute) (chronic): Secondary | ICD-10-CM | POA: Diagnosis present

## 2017-12-09 DIAGNOSIS — N19 Unspecified kidney failure: Secondary | ICD-10-CM | POA: Diagnosis present

## 2017-12-09 DIAGNOSIS — C786 Secondary malignant neoplasm of retroperitoneum and peritoneum: Secondary | ICD-10-CM | POA: Diagnosis present

## 2017-12-09 DIAGNOSIS — E440 Moderate protein-calorie malnutrition: Secondary | ICD-10-CM | POA: Diagnosis present

## 2017-12-09 DIAGNOSIS — I129 Hypertensive chronic kidney disease with stage 1 through stage 4 chronic kidney disease, or unspecified chronic kidney disease: Secondary | ICD-10-CM | POA: Diagnosis present

## 2017-12-09 DIAGNOSIS — D631 Anemia in chronic kidney disease: Secondary | ICD-10-CM | POA: Diagnosis present

## 2017-12-09 DIAGNOSIS — Z809 Family history of malignant neoplasm, unspecified: Secondary | ICD-10-CM

## 2017-12-09 DIAGNOSIS — Z9289 Personal history of other medical treatment: Secondary | ICD-10-CM

## 2017-12-09 DIAGNOSIS — J690 Pneumonitis due to inhalation of food and vomit: Secondary | ICD-10-CM | POA: Diagnosis not present

## 2017-12-09 DIAGNOSIS — Z8719 Personal history of other diseases of the digestive system: Secondary | ICD-10-CM

## 2017-12-09 DIAGNOSIS — K5909 Other constipation: Secondary | ICD-10-CM

## 2017-12-09 DIAGNOSIS — J9690 Respiratory failure, unspecified, unspecified whether with hypoxia or hypercapnia: Secondary | ICD-10-CM

## 2017-12-09 DIAGNOSIS — T8131XA Disruption of external operation (surgical) wound, not elsewhere classified, initial encounter: Secondary | ICD-10-CM | POA: Diagnosis present

## 2017-12-09 DIAGNOSIS — R180 Malignant ascites: Secondary | ICD-10-CM | POA: Diagnosis present

## 2017-12-09 DIAGNOSIS — I700 Atherosclerosis of aorta: Secondary | ICD-10-CM | POA: Diagnosis present

## 2017-12-09 DIAGNOSIS — Z90710 Acquired absence of both cervix and uterus: Secondary | ICD-10-CM

## 2017-12-09 DIAGNOSIS — K56609 Unspecified intestinal obstruction, unspecified as to partial versus complete obstruction: Secondary | ICD-10-CM | POA: Diagnosis present

## 2017-12-09 DIAGNOSIS — I2699 Other pulmonary embolism without acute cor pulmonale: Secondary | ICD-10-CM | POA: Diagnosis not present

## 2017-12-09 DIAGNOSIS — E871 Hypo-osmolality and hyponatremia: Secondary | ICD-10-CM | POA: Diagnosis present

## 2017-12-09 DIAGNOSIS — Z789 Other specified health status: Secondary | ICD-10-CM

## 2017-12-09 DIAGNOSIS — R0902 Hypoxemia: Secondary | ICD-10-CM

## 2017-12-09 DIAGNOSIS — R1010 Upper abdominal pain, unspecified: Secondary | ICD-10-CM

## 2017-12-09 DIAGNOSIS — J9600 Acute respiratory failure, unspecified whether with hypoxia or hypercapnia: Secondary | ICD-10-CM

## 2017-12-09 DIAGNOSIS — C801 Malignant (primary) neoplasm, unspecified: Secondary | ICD-10-CM

## 2017-12-09 DIAGNOSIS — J9601 Acute respiratory failure with hypoxia: Secondary | ICD-10-CM | POA: Diagnosis not present

## 2017-12-09 DIAGNOSIS — K219 Gastro-esophageal reflux disease without esophagitis: Secondary | ICD-10-CM | POA: Diagnosis present

## 2017-12-09 DIAGNOSIS — R451 Restlessness and agitation: Secondary | ICD-10-CM | POA: Diagnosis not present

## 2017-12-09 DIAGNOSIS — I248 Other forms of acute ischemic heart disease: Secondary | ICD-10-CM | POA: Diagnosis not present

## 2017-12-09 LAB — CBC WITH DIFFERENTIAL/PLATELET
BASOS ABS: 0.1 10*3/uL (ref 0.0–0.1)
Basophils Relative: 1 %
Eosinophils Absolute: 0.4 10*3/uL (ref 0.0–0.5)
Eosinophils Relative: 3 %
HEMATOCRIT: 33 % — AB (ref 34.8–46.6)
Hemoglobin: 10.5 g/dL — ABNORMAL LOW (ref 11.6–15.9)
LYMPHS PCT: 10 %
Lymphs Abs: 1.2 10*3/uL (ref 0.9–3.3)
MCH: 22.8 pg — ABNORMAL LOW (ref 25.1–34.0)
MCHC: 31.8 g/dL (ref 31.5–36.0)
MCV: 71.9 fL — AB (ref 79.5–101.0)
Monocytes Absolute: 1 10*3/uL — ABNORMAL HIGH (ref 0.1–0.9)
Monocytes Relative: 8 %
NEUTROS ABS: 10.3 10*3/uL — AB (ref 1.5–6.5)
Neutrophils Relative %: 78 %
Platelets: 718 10*3/uL — ABNORMAL HIGH (ref 145–400)
RBC: 4.59 MIL/uL (ref 3.70–5.45)
RDW: 19.8 % — ABNORMAL HIGH (ref 11.2–14.5)
WBC: 13 10*3/uL — AB (ref 3.9–10.3)

## 2017-12-09 LAB — COMPREHENSIVE METABOLIC PANEL
ALT: 18 U/L (ref 0–55)
ANION GAP: 12 — AB (ref 3–11)
AST: 14 U/L (ref 5–34)
Albumin: 2.8 g/dL — ABNORMAL LOW (ref 3.5–5.0)
Alkaline Phosphatase: 97 U/L (ref 40–150)
BILIRUBIN TOTAL: 0.3 mg/dL (ref 0.2–1.2)
BUN: 36 mg/dL — ABNORMAL HIGH (ref 7–26)
CO2: 22 mmol/L (ref 22–29)
Calcium: 10 mg/dL (ref 8.4–10.4)
Chloride: 100 mmol/L (ref 98–109)
Creatinine, Ser: 2.66 mg/dL — ABNORMAL HIGH (ref 0.60–1.10)
GFR calc Af Amer: 21 mL/min — ABNORMAL LOW (ref >60)
GFR, EST NON AFRICAN AMERICAN: 18 mL/min — AB (ref >60)
GLUCOSE: 107 mg/dL (ref 70–140)
POTASSIUM: 4.1 mmol/L (ref 3.5–5.1)
Sodium: 134 mmol/L — ABNORMAL LOW (ref 136–145)
TOTAL PROTEIN: 8.3 g/dL (ref 6.4–8.3)

## 2017-12-09 LAB — SAMPLE TO BLOOD BANK

## 2017-12-09 MED ORDER — OXYCODONE HCL 5 MG PO TABS
5.0000 mg | ORAL_TABLET | ORAL | Status: DC | PRN
  Administered 2017-12-09 – 2017-12-15 (×18): 5 mg via ORAL
  Filled 2017-12-09 (×20): qty 1

## 2017-12-09 MED ORDER — ONDANSETRON HCL 4 MG/2ML IJ SOLN
4.0000 mg | Freq: Four times a day (QID) | INTRAMUSCULAR | Status: DC | PRN
  Administered 2017-12-14 (×2): 4 mg via INTRAVENOUS
  Filled 2017-12-09 (×2): qty 2

## 2017-12-09 MED ORDER — ENSURE ENLIVE PO LIQD
237.0000 mL | Freq: Two times a day (BID) | ORAL | Status: DC
  Administered 2017-12-10 – 2017-12-13 (×3): 237 mL via ORAL

## 2017-12-09 MED ORDER — HEPARIN SODIUM (PORCINE) 5000 UNIT/ML IJ SOLN
5000.0000 [IU] | Freq: Three times a day (TID) | INTRAMUSCULAR | Status: DC
  Administered 2017-12-09 – 2017-12-12 (×8): 5000 [IU] via SUBCUTANEOUS
  Filled 2017-12-09 (×8): qty 1

## 2017-12-09 MED ORDER — ONDANSETRON HCL 4 MG PO TABS
4.0000 mg | ORAL_TABLET | Freq: Four times a day (QID) | ORAL | Status: DC | PRN
  Administered 2017-12-10: 4 mg via ORAL
  Filled 2017-12-09: qty 1

## 2017-12-09 MED ORDER — ACETAMINOPHEN 650 MG RE SUPP: 650.0000 mg | Freq: Four times a day (QID) | RECTAL | Status: DC | PRN

## 2017-12-09 MED ORDER — DEXTROSE-NACL 5-0.45 % IV SOLN
INTRAVENOUS | Status: DC
  Administered 2017-12-09 – 2017-12-10 (×3): via INTRAVENOUS

## 2017-12-09 MED ORDER — MAGNESIUM CITRATE PO SOLN
1.0000 | Freq: Once | ORAL | Status: AC
  Administered 2017-12-09: 1 via ORAL
  Filled 2017-12-09: qty 296

## 2017-12-09 MED ORDER — HYDROMORPHONE HCL 1 MG/ML IJ SOLN
0.5000 mg | INTRAMUSCULAR | Status: DC | PRN
  Administered 2017-12-09 – 2017-12-12 (×12): 0.5 mg via INTRAVENOUS
  Filled 2017-12-09 (×12): qty 1

## 2017-12-09 MED ORDER — ACETAMINOPHEN 325 MG PO TABS
650.0000 mg | ORAL_TABLET | Freq: Four times a day (QID) | ORAL | Status: DC | PRN
  Administered 2017-12-15: 650 mg via ORAL
  Filled 2017-12-09: qty 2

## 2017-12-09 MED ORDER — SENNOSIDES-DOCUSATE SODIUM 8.6-50 MG PO TABS
1.0000 | ORAL_TABLET | Freq: Every day | ORAL | Status: DC
  Administered 2017-12-10 – 2017-12-15 (×6): 1 via ORAL
  Filled 2017-12-09 (×7): qty 1

## 2017-12-09 NOTE — Progress Notes (Signed)
Pt states it is ok to ask questions on nursing admission history while visitor is in the room. Lucius Conn BSN, RN-BC Admissions RN 12/15/2017 5:21 PM

## 2017-12-09 NOTE — Progress Notes (Signed)
Please see H&P from today's admission.

## 2017-12-09 NOTE — Assessment & Plan Note (Signed)
She has severe constipation We discussed laxative therapy

## 2017-12-09 NOTE — Assessment & Plan Note (Signed)
The patient has profound iron deficiency anemia secondary to postmenopausal bleeding and recent postop blood loss I would recheck blood count to make sure she does not need intravenous iron or blood transfusion

## 2017-12-09 NOTE — Progress Notes (Signed)
Deport NOTE  Patient Care Team: Lin Landsman, MD as PCP - General (Family Medicine)  ASSESSMENT & PLAN:  Endometrial cancer [facility]) We discussed briefly the role of adjuvant treatment The patient is not ready to begin treatment due to poor wound healing She would need chemo education class, port placement, blood work and chemotherapy consent I will coordinate appointment with GYN oncologist to determine the timing of the start date of adjuvant treatment   Iron deficiency anemia due to chronic blood loss The patient has profound iron deficiency anemia secondary to postmenopausal bleeding and recent postop blood loss I would recheck blood count to make sure she does not need intravenous iron or blood transfusion  Wound dehiscence, surgical, initial encounter She has mild wound dehiscence I would defer to GYN oncologist for further workup and follow-up  Acute prerenal failure ([place]) The patient has poor oral intake and clinically appears dehydrated I recommend discontinuation of hydrochlorothiazide Recheck blood work show acute renal failure I have discussed this with GYN oncologist who plan to admit the patient for further hydration therapy  Abdominal pain She has multifactorial pain, likely due to recent surgery and constipation She will continue prescription pain medicine  Other constipation She has severe constipation We discussed laxative therapy  Family history of cancer She has strong family history of cancer I would discuss with genetic counselor to see if she would qualify for genetic counseling   Orders Placed This Encounter  Procedures  . CBC with Differential/Platelet    Standing Status:   Future    Number of Occurrences:   1    Standing Expiration Date:   01/13/2019  . Comprehensive metabolic panel    Standing Status:   Future    Number of Occurrences:   1    Standing Expiration Date:   01/13/2019  . Ferritin    Standing Status:    Future    Number of Occurrences:   1    Standing Expiration Date:   12/09/2018  . Iron and TIBC    Standing Status:   Future    Number of Occurrences:   1    Standing Expiration Date:   01/13/2019  . Sample to Blood Bank    Standing Status:   Standing    Number of Occurrences:   33    Standing Expiration Date:   12/10/2018     CHIEF COMPLAINTS/PURPOSE OF CONSULTATION:  Endometrial cancer, for adjuvant treatment  HISTORY OF PRESENTING ILLNESS:  [name] 62 y.o. female is here because of recent diagnosis of endometrial cancer. Her sister, [name] is present. I have reviewed her chart and materials related to her cancer extensively and collaborated history with the patient. Summary of oncologic history is as follows: Oncology History   MSI stable     Endometrial cancer ([place])   08/09/2016 Initial Diagnosis    She has been complaining of postmenopausal bleeding      10/01/2016 Imaging    US pelvis 1. Vague debris and trace fluid within the lower uterine segment and cervix, with adjacent 3.8 cm right posterior submucosal fibroid abutting the endometrial canal. Evaluation is suboptimal due to the patient's habitus. 2. Mildly larger 4.8 cm left posterior subserosal fibroid also noted. Uterus retroverted in nature. 3. Ovaries not visualized on this study.      08/19/2017 Imaging    US pelvis Multiple uterine fibroids, with at least 2 central submucosal fibroids noted. Endometrial stripe could not be visualized due to  these fibroids.  Nonvisualization of ovaries, however no adnexal mass identified.      10/24/2017 Pathology Results    1. 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 2. 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 Microscopic Comment 1. There are fragments with high grade papillary serous carcinoma. In a few fragments there is also a malignant stromal component.      10/31/2017 Imaging    CT abdomen and  pelvis: Markedly enlarged uterus with numerous small uterine fibroids.  Diffuse endometrial thickening, consistent with known endometrial carcinosarcoma.   Ill-defined 6 cm right adnexal mass, suspicious for extra-uterine extension of malignancy.  Small abdominal retroperitoneal and right lower quadrant mesenteric lymph nodes measuring up to 10 mm. Differential diagnosis includes metastatic disease and reactive lymph nodes      11/01/2017 Tumor Marker    Patient's tumor was tested for the following markers: CA-125 Results of the tumor marker test revealed 511.2      11/24/2017 Pathology Results    1. Omentum, resection other than for tumor - METASTATIC CARCINOMA - SEE COMMENT 2. Uterus +/- tubes/ovaries, neoplastic, cervix - CARCINOSARCOMA (MALIGNANT MIXED MUELLERIAN TUMOR) - LYMPHOVASCULAR SPACE INVASION PRESENT - LEIOMYOMATA (3.5 CM; LARGEST) AND ADENOMYOSIS - SEE ONCOLOGY TABLE AND COMMENT BELOW Microscopic Comment 1. The carcinoma has the same morphology as that seen in the fallopian tubes, ovaries and cervix. The sarcomatous component is not identified in this specimen. 2. ONCOLOGY TABLE-UTERUS, CARCINOMA OR CARCINOSARCOMA Procedure: Total hysterectomy, bilateral salpingo-oophorectomy, and omentectomy Histologic type: Carcinosarcoma (Malignant mixed muellerian tumor) Grade: FIGO Grade 3 Myometrial invasion: Present (Estimated less than 50% myometrial invasion with carcinomatous involvement of adenomyosis) Uterine Serosa Involvement: Present Cervical stromal involvement: Present Extent of involvement of other organs: Bilateral ovaries, Bilateral fallopian tubes, Omentum Lymphovascular invasion: Present Regional Lymph nodes: No lymph nodes submitted or found Pathologic Stage Classification (pTNM, AJCC 8th edition): pT3a pNX Tumor block for ancillary studies: 69F Comment: The tumor present within the endometrium is a carcinosarcoma; however, only the serous carcinomatous  component is present within the cervical stroma, ovaries, fallopian tubes and omentum. MMR by immunohistochemistry is pending and will be reported in an addendum.        11/24/2017 Surgery    Procedure(s) Performed: 1. Exploratory laparotomy with tptal abdominal hysterectomy, bilateral salpingo-oophorectomy, omentectomy radical tumor debulking for metastatic endometrial cancer .  Surgeon: Thereasa Solo, MD.  Specimens: Uterus, Bilateral tubes / ovaries, omentum.    Operative Findings: large volume ascites, omental caking of tumor densely adherent to anterior abdominal wall, infiltrating transverse colon mesentery and falciform ligament. Tumor coating right diaphragm. There were loops of ileum adherent to the uterine fundus and sigmoid colon. The uterus was grossly enlarged to 20cm with tumor involving the right ovary and the left ovary adherent to the uterus. Residual tumor at the completion of the surgery coating the diaphragms (thin) with <1cm implants in mesentery of bowel.   This represented an optimal cytoreduction (R1).          12/01/2017 Cancer Staging    Staging form: Corpus Uteri - Carcinoma and Carcinosarcoma, AJCC 8th Edition - Pathologic: Stage IVB (pT3, pN1, pM1) - Signed by Heath Lark, MD on 12/01/2017      Since surgery, she has been miserable Her oral intake is poor She has lost over 30 pounds She felt bloated and sensation of indigestion She denies nausea or vomiting She has been constipated for 3 days She has pain at the incision site She has been  taking pain medicine as directed She is on Lovenox for DVT prophylaxis and has been having some hemorrhoidal bleeding.  She stopped taking her anticoagulation therapy recently She denies epistaxis, hematuria or hematemesis  MEDICAL HISTORY:  Past Medical History:  Diagnosis Date  . Cancer [facility])    endometrial cancer  . Fibroids   . GERD (gastroesophageal reflux disease)   . Hypertension   . Sinusitis      SURGICAL HISTORY: Past Surgical History:  Procedure Laterality Date  . ABDOMINAL HYSTERECTOMY N/A 11/24/2017   Procedure: TOTAL HYSTERECTOMY ABDOMINALGREATER 250 GRAMS;  Surgeon: Everitt Amber, MD;  Location: [facility];  Service: Gynecology;  Laterality: N/A;  . DEBULKING N/A 11/24/2017   Procedure: RADICAL TUMOR DEBULKING;  Surgeon: Everitt Amber, MD;  Location: [facility];  Service: Gynecology;  Laterality: N/A;  . LAPAROTOMY N/A 11/24/2017   Procedure: EXPLORATORY LAPAROTOMY;  Surgeon: Everitt Amber, MD;  Location: [facility];  Service: Gynecology;  Laterality: N/A;  . LYMPH NODE DISSECTION N/A 11/24/2017   Procedure: OMENTECTOMY;  Surgeon: Everitt Amber, MD;  Location: [facility];  Service: Gynecology;  Laterality: N/A;  . MYOMECTOMY    . SALPINGOOPHORECTOMY Bilateral 11/24/2017   Procedure: Merlene Laughter SALPINGO OOPHORECTOMY;  Surgeon: Everitt Amber, MD;  Location: [facility];  Service: Gynecology;  Laterality: Bilateral;    SOCIAL HISTORY: Social History   Socioeconomic History  . Marital status: Married    Spouse name: Not on file  . Number of children: 0  . Years of education: Not on file  . Highest education level: Not on file  Occupational History  . Occupation: unemployed  Social Needs  . Financial resource strain: Not on file  . Food insecurity:    Worry: Not on file    Inability: Not on file  . Transportation needs:    Medical: Not on file    Non-medical: Not on file  Tobacco Use  . Smoking status: Former Smoker    Years: 4.00    Types: Cigarettes    Last attempt to quit: 10/18/1978    Years since quitting: 39.1  . Smokeless tobacco: Never Used  . Tobacco comment: smoked cigarrettes when she was 25 to age 42 then quit.   Substance and Sexual Activity  . Alcohol use: Yes    Comment: rarely  . Drug use: No  . Sexual activity: Not on file  Lifestyle  . Physical activity:    Days per week: Not on file    Minutes per session: Not on file  . Stress: Not on file  Relationships  . Social  connections:    Talks on phone: Not on file    Gets together: Not on file    Attends religious service: Not on file    Active member of club or organization: Not on file    Attends meetings of clubs or organizations: Not on file    Relationship status: Not on file  . Intimate partner violence:    Fear of current or ex partner: Not on file    Emotionally abused: Not on file    Physically abused: Not on file    Forced sexual activity: Not on file  Other Topics Concern  . Not on file  Social History Narrative  . Not on file    FAMILY HISTORY: Family History  Problem Relation Age of Onset  . Diabetes Mother   . Prostate cancer Brother 53  . Colon cancer Paternal Grandmother   . Prostate cancer Brother 69    ALLERGIES:  is allergic to codeine.  MEDICATIONS:  Current Outpatient Medications  Medication Sig Dispense Refill  . acetaminophen (TYLENOL) 500 MG tablet Take 1,500 mg by mouth every 6 (six) hours as needed for moderate pain.     Marland Kitchen BIOTIN PO Take 1 tablet by mouth daily.    . diphenhydramine-acetaminophen (TYLENOL PM) 25-500 MG TABS tablet Take 1 tablet by mouth at bedtime as needed (sleep).    . docusate sodium (COLACE) 100 MG capsule Take 1 capsule (100 mg total) by mouth 2 (two) times daily. 20 capsule 0  . enoxaparin (LOVENOX) 40 MG/0.4ML injection Inject 0.4 mLs (40 mg total) into the skin daily for 25 days. 25 Syringe 0  . hydrochlorothiazide (HYDRODIURIL) 25 MG tablet Take 1 tablet (25 mg total) by mouth daily. 30 tablet 2  . ibuprofen (ADVIL,MOTRIN) 600 MG tablet Take 1 tablet (600 mg total) by mouth every 6 (six) hours as needed. (Patient taking differently: Take 600 mg by mouth every 6 (six) hours as needed for moderate pain. ) 30 tablet 1  . oxyCODONE-acetaminophen (PERCOCET/ROXICET) 5-325 MG tablet Take 1-2 tablets by mouth every 4 (four) hours as needed for severe pain. 30 tablet 0  . polyethylene glycol (MIRALAX / GLYCOLAX) packet Take 17 g by mouth 2 (two) times  daily. 14 each 0   No current facility-administered medications for this visit.     REVIEW OF SYSTEMS:   Constitutional: Denies fevers, chills or abnormal night sweats Eyes: Denies blurriness of vision, double vision or watery eyes Ears, nose, mouth, throat, and face: Denies mucositis or sore throat Respiratory: Denies cough, dyspnea or wheezes Cardiovascular: Denies palpitation, chest discomfort or lower extremity swelling Skin: Denies abnormal skin rashes Lymphatics: Denies new lymphadenopathy or easy bruising Neurological:Denies numbness, tingling or new weaknesses Behavioral/Psych: Mood is stable, no new changes  All other systems were reviewed with the patient and are negative.  PHYSICAL EXAMINATION: ECOG PERFORMANCE STATUS: 2 - Symptomatic, <50% confined to bed  Vitals:   12/19/2017 1400  BP: (!) 121/98  Pulse: (!) 113  Resp: 18  Temp: 98.4 F (36.9 C)  SpO2: 100%   Filed Weights   12/25/2017 1400  Weight: 242 lb 12.8 oz (110.1 kg)    GENERAL:alert, in mild distress and appears uncomfortable SKIN: skin color, texture, turgor are normal, no rashes or significant lesions EYES: normal, conjunctiva are pink and non-injected, sclera clear OROPHARYNX:no exudate, no erythema and lips, buccal mucosa, and tongue normal  NECK: supple, thyroid normal size, non-tender, without nodularity LYMPH:  no palpable lymphadenopathy in the cervical, axillary or inguinal LUNGS: clear to auscultation and percussion with normal breathing effort HEART: regular rate & rhythm and no murmurs and no lower extremity edema ABDOMEN:abdomen soft, tenderness along the incision site with mild dehiscence at the lower and near the suprapubic region  musculoskeletal:no cyanosis of digits and no clubbing  PSYCH: alert & oriented x 3 with fluent speech NEURO: no focal motor/sensory deficits  LABORATORY DATA:  I have reviewed the data as listed Lab Results  Component Value Date   WBC 13.0 (H) 12/07/2017    HGB 10.5 (L) 12/23/2017   HCT 33.0 (L) 12/23/2017   MCV 71.9 (L) 12/06/2017   PLT 718 (H) 12/26/2017   Recent Labs    10/27/17 1423 11/17/17 1411  11/26/17 0446 11/27/17 0401 12/07/2017 1443  NA 140 138   < > 134* 134* 134*  K 4.2 4.9   < > 4.1 4.2 4.1  CL 105 107   < >  103 103 100  CO2 19* 22   < > '24 23 22  '$ GLUCOSE 91 88   < > 125* 101* 107  BUN 14 22*   < > 22* 18 36*  CREATININE 1.33* 1.42*   < > 1.44* 1.25* 2.66*  CALCIUM 9.8 9.2   < > 8.1* 8.3* 10.0  GFRNONAA 44* 40*   < > 39* 46* 18*  GFRAA 50* 46*   < > 45* 53* 21*  PROT 8.3 8.5*  --   --   --  8.3  ALBUMIN 4.2 3.5  --   --   --  2.8*  AST 16 23  --   --   --  14  ALT 13 17  --   --   --  18  ALKPHOS 86 74  --   --   --  97  BILITOT 0.3 0.4  --   --   --  0.3   < > = values in this interval not displayed.    I spent 40 minutes counseling the patient face to face. The total time spent in the appointment was 60 minutes and more than 50% was on counseling.  All questions were answered. The patient knows to call the clinic with any problems, questions or concerns.  Heath Lark, MD 01/01/2018 3:51 PM

## 2017-12-09 NOTE — Assessment & Plan Note (Signed)
She has strong family history of cancer I would discuss with genetic counselor to see if she would qualify for genetic counseling

## 2017-12-09 NOTE — Assessment & Plan Note (Signed)
She has mild wound dehiscence I would defer to GYN oncologist for further workup and follow-up

## 2017-12-09 NOTE — Assessment & Plan Note (Signed)
She has multifactorial pain, likely due to recent surgery and constipation She will continue prescription pain medicine

## 2017-12-09 NOTE — H&P (Signed)
H&P Note: Gyn-Onc  CC:  AKI Anemia Constipation Stage IV uterine cancer  Assessment/Plan:  Robyn Guerra  is a 62 y.o.  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 She also has acute blood loss anemia.  AKI: baseline creatinine elevated at 1.4 representing some baseline renal dysfunction. AKI is likely secondary to dehydration. Give IV hydration. Recommend holding nephrotoxic agents such as lovenox and ibuprofen.  Anemia: recheck Hb in morning. If becomes <7 would consider transfusion.  Constipation: aggressive bowel regimen. Try senakot nightly and mag citrate x 1.   Abdominal pain: oral meds (percocet) and IV dilaudid prn for pain. Minimize opioids due to constipation. Use tylenol also  Bring in to hospital for observation and hydration. Repeat labs daily.  HPI: Robyn Guerra is a 62 year old nulliparous woman who is seen in consultation at the request of Dr Ihor Dow for high grade endometrial cancer, fibroid uterus.  The patient reports vaginal bleeding for since December, 2017. It is daily but light. She was evaluated with a TVUS on 10/03/16 which showed a uterus with measurements: 14.4 x 5.8 x 9.7 cm. There appears to be a 3.8 x 3.2 x 3.0 cm right posterior submucosal fibroid abutting the endometrial canal. A slightly larger 4.8 x 4.1 x 3.0 cm left posterior subserosal fibroid is also seen. The uterus is retroverted in nature. The endometrium was thickened to 1.8 cm. Vague debris and trace fluid are seen within the lower uterine segment and cervix.    She continued to have vaginal bleeding and a repeat US was performed on 08/19/18 which showed uterine measurements: 14.5 x 10.4 x 10.9 cm. Multiple uterine fibroids are seen. The largest fibroid measuring 4.7 cm, as well as a second fibroid measuring 2.8 cm, are located centrally consistent with  submucosal fibroids. The posterior subserosal fibroid is also seen measuring 4.4 cm . These show no significant change compared to prior study. Endometrium thickness could not be visualized due to acoustic shadowing from fibroids described above.  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   CT abdo/pelvis on 10/31/17 showed a few small less than 1 cm retroperitoneal lymph nodes are seen in the left paraaortic and aortocaval spaces. 10 mm right lower quadrant mesenteric lymph node is seen. No abdominal aortic aneurysm. Aortic atherosclerosis.  Enlarged uterus measuring 19.5 cm in length. Multiple small uterine fibroids are seen, largest measuring approximately 5cm. Diffuse endometrial thickening is seen measuring 24 mm,consistent with history of known endometrial carcinosarcoma. Poorly defined heterogeneously enhancing mass is seen in the right adnexa abutting the uterus, which measures 6.1 x 5.5 cm. This is suspicious for extra uterine extension of malignancy. No evidence of ascites.  The patient is morbidly obese with a BMI of 46kg/m2. She has HTN. She was negative 1 year ago for diabetes screen. She has had no vaginal deliveries.  She has a remote history of an abdominal myomectomy 20 years ago.   Interval Hx:  On 11/24/17 she underwent ex lap TAH, BSO, omentectomy and radical tumor debulking.  She did well postoperatively however then subsequently developed severe constipation and bloody stools from her bleeding hemorrhoids.  She held the Lovenox for approximately 2 days on March 30 and  April 1 however then resumed it.  She P try to avoid Percocet although she does have abdominal pain from her constipation.  She had one episode of emesis on March 28 but none since that time.  She is states she is able  to keep down oral agents food and liquids.  She is passing flatus.  She denies fevers.  A small amount of her wound has opened at the inferior aspect.  Labs were checked on 12/20/2017 which revealed a mildly elevated white count 13,000, anemia with a hemoglobin of 10.5, thrombocytosis at 718.  Her creatinine was substantially elevated to 2.6.  Of note her preoperative and postoperative creatinine had been stable at 1.4.   Current Meds:  Outpatient Encounter Medications as of 11/01/2017  Medication Sig  . acetaminophen (TYLENOL) 500 MG tablet Take 1,000 mg by mouth every 6 (six) hours as needed for moderate pain.  . hydrochlorothiazide (HYDRODIURIL) 25 MG tablet Take 1 tablet (25 mg total) by mouth daily.  Marland Kitchen ibuprofen (ADVIL,MOTRIN) 600 MG tablet Take 1 tablet (600 mg total) by mouth every 6 (six) hours as needed.  . megestrol (MEGACE) 40 MG tablet Take 1 tablet (40 mg total) by mouth 2 (two) times daily.  . [DISCONTINUED] misoprostol (CYTOTEC) 200 MCG tablet Place two tablets by mouth 6-8 hours prior to your next clinic appointment   No facility-administered encounter medications on file as of 11/01/2017.     Allergy:  Allergies  Allergen Reactions  . Codeine Nausea And Vomiting    Room spins    Social Hx:   Social History   Socioeconomic History  . Marital status: Married    Spouse name: Not on file  . Number of children: 0  . Years of education: Not on file  . Highest education level: Not on file  Occupational History  . Occupation: unemployed  Social Needs  . Financial resource strain: Not on file  . Food insecurity:    Worry: Not on file    Inability: Not on file  . Transportation needs:    Medical: Not on file    Non-medical: Not on file  Tobacco Use  . Smoking status: Former Smoker    Years: 4.00    Types: Cigarettes    Last attempt to quit: 10/18/1978    Years since quitting: 39.1  . Smokeless tobacco: Never Used  . Tobacco comment: smoked cigarrettes when she was  69 to age 55 then quit.   Substance and Sexual Activity  . Alcohol use: Yes    Comment: rarely  . Drug use: No  . Sexual activity: Not on file  Lifestyle  . Physical activity:    Days per week: Not on file    Minutes per session: Not on file  . Stress: Not on file  Relationships  . Social connections:    Talks on phone: Not on file    Gets together: Not on file    Attends religious service: Not on file    Active member of club or organization: Not on file    Attends meetings of clubs or organizations: Not on file    Relationship status: Not on file  . Intimate partner violence:    Fear of current or ex partner: Not on file    Emotionally abused: Not on file    Physically abused: Not on file    Forced sexual activity: Not on file  Other Topics Concern  . Not on file  Social History Narrative  .  Not on file    Past Surgical Hx:  Past Surgical History:  Procedure Laterality Date  . ABDOMINAL HYSTERECTOMY N/A 11/24/2017   Procedure: TOTAL HYSTERECTOMY ABDOMINALGREATER 250 GRAMS;  Surgeon: Everitt Amber, MD;  Location: [facility];  Service: Gynecology;  Laterality: N/A;  . DEBULKING N/A 11/24/2017   Procedure: RADICAL TUMOR DEBULKING;  Surgeon: Everitt Amber, MD;  Location: [facility];  Service: Gynecology;  Laterality: N/A;  . LAPAROTOMY N/A 11/24/2017   Procedure: EXPLORATORY LAPAROTOMY;  Surgeon: Everitt Amber, MD;  Location: [facility];  Service: Gynecology;  Laterality: N/A;  . LYMPH NODE DISSECTION N/A 11/24/2017   Procedure: OMENTECTOMY;  Surgeon: Everitt Amber, MD;  Location: [facility];  Service: Gynecology;  Laterality: N/A;  . MYOMECTOMY    . SALPINGOOPHORECTOMY Bilateral 11/24/2017   Procedure: Merlene Laughter SALPINGO OOPHORECTOMY;  Surgeon: Everitt Amber, MD;  Location: [facility];  Service: Gynecology;  Laterality: Bilateral;    Past Medical Hx:  Past Medical History:  Diagnosis Date  . Cancer [facility])    endometrial cancer  . Fibroids   . GERD (gastroesophageal reflux disease)   . Hypertension    . Sinusitis     Past Gynecological History:  Myomectomy in her 89's for symptomatic uterine fibroids. Patient's last menstrual period was 04/20/2016 (within weeks).  Family Hx:  Family History  Problem Relation Age of Onset  . Diabetes Mother   . Prostate cancer Brother 91  . Colon cancer Paternal Grandmother   . Prostate cancer Brother 70    Review of Systems:  Constitutional  Feels well,  ENT Normal appearing ears and nares bilaterally Skin/Breast  No rash, sores, jaundice, itching, dryness Cardiovascular  No chest pain, shortness of breath, or edema  Pulmonary  No cough or wheeze.  Gastro Intestinal  No nausea, vomitting, or diarrhoea. No bright red blood per rectum, no abdominal pain, change in bowel movement, or constipation.  Genito Urinary  No frequency, urgency, dysuria, + bleeding.  Musculo Skeletal  No myalgia, arthralgia, joint swelling or pain  Neurologic  No weakness, numbness, change in gait,  Psychology  No depression, anxiety, insomnia.   Vitals:  Last menstrual period 04/20/2016.  Physical Exam: WD in NAD Neck  Supple NROM, without any enlargements.  Lymph Node Survey No cervical supraclavicular or inguinal adenopathy Cardiovascular  Pulse normal rate, regularity and rhythm. S1 and S2 normal.  Lungs  Clear to auscultation bilateraly, without wheezes/crackles/rhonchi. Good air movement.  Skin  No rash/lesions/breakdown  Psychiatry  Alert and oriented to person, place, and time  Abdomen  Normoactive bowel sounds, abdomen soft, obese, distended but not tympanitic. No rebound or guarding. Incision in tact and free of erythema.2cm of inferior wound separation. No drainage.  Back No CVA tenderness Genito Urinary  deferred Rectal  deferred Extremities  No bilateral cyanosis, clubbing or edema.   Robyn Solo, MD  12/13/2017, 4:17 PM

## 2017-12-09 NOTE — Assessment & Plan Note (Signed)
We discussed briefly the role of adjuvant treatment The patient is not ready to begin treatment due to poor wound healing She would need chemo education class, port placement, blood work and chemotherapy consent I will coordinate appointment with GYN oncologist to determine the timing of the start date of adjuvant treatment

## 2017-12-09 NOTE — Patient Instructions (Signed)
Dr Denman George will admit you to the hospital for IV hydration, pain control and management of constipation.

## 2017-12-09 NOTE — Assessment & Plan Note (Signed)
The patient has poor oral intake and clinically appears dehydrated I recommend discontinuation of hydrochlorothiazide Recheck blood work show acute renal failure I have discussed this with GYN oncologist who plan to admit the patient for further hydration therapy

## 2017-12-10 DIAGNOSIS — N179 Acute kidney failure, unspecified: Secondary | ICD-10-CM | POA: Diagnosis not present

## 2017-12-10 DIAGNOSIS — C55 Malignant neoplasm of uterus, part unspecified: Secondary | ICD-10-CM | POA: Diagnosis not present

## 2017-12-10 LAB — COMPREHENSIVE METABOLIC PANEL
ALK PHOS: 77 U/L (ref 38–126)
ALT: 17 U/L (ref 14–54)
AST: 16 U/L (ref 15–41)
Albumin: 2.6 g/dL — ABNORMAL LOW (ref 3.5–5.0)
Anion gap: 10 (ref 5–15)
BILIRUBIN TOTAL: 0.3 mg/dL (ref 0.3–1.2)
BUN: 33 mg/dL — AB (ref 6–20)
CALCIUM: 8.8 mg/dL — AB (ref 8.9–10.3)
CO2: 23 mmol/L (ref 22–32)
CREATININE: 2.05 mg/dL — AB (ref 0.44–1.00)
Chloride: 100 mmol/L — ABNORMAL LOW (ref 101–111)
GFR, EST AFRICAN AMERICAN: 29 mL/min — AB (ref >60)
GFR, EST NON AFRICAN AMERICAN: 25 mL/min — AB (ref >60)
Glucose, Bld: 136 mg/dL — ABNORMAL HIGH (ref 65–99)
Potassium: 3.6 mmol/L (ref 3.5–5.1)
Sodium: 133 mmol/L — ABNORMAL LOW (ref 135–145)
TOTAL PROTEIN: 7.5 g/dL (ref 6.5–8.1)

## 2017-12-10 LAB — CBC
HCT: 29.7 % — ABNORMAL LOW (ref 36.0–46.0)
Hemoglobin: 9.5 g/dL — ABNORMAL LOW (ref 12.0–15.0)
MCH: 23.1 pg — ABNORMAL LOW (ref 26.0–34.0)
MCHC: 32 g/dL (ref 30.0–36.0)
MCV: 72.3 fL — ABNORMAL LOW (ref 78.0–100.0)
PLATELETS: 704 10*3/uL — AB (ref 150–400)
RBC: 4.11 MIL/uL (ref 3.87–5.11)
RDW: 19 % — AB (ref 11.5–15.5)
WBC: 11.6 10*3/uL — ABNORMAL HIGH (ref 4.0–10.5)

## 2017-12-10 MED ORDER — HYDROMORPHONE HCL 1 MG/ML IJ SOLN
0.5000 mg | Freq: Once | INTRAMUSCULAR | Status: AC
  Administered 2017-12-10: 0.5 mg via INTRAVENOUS
  Filled 2017-12-10: qty 1

## 2017-12-10 MED ORDER — DEXTROSE-NACL 5-0.9 % IV SOLN
INTRAVENOUS | Status: DC
  Administered 2017-12-10 – 2017-12-11 (×3): via INTRAVENOUS

## 2017-12-10 MED ORDER — FLEET ENEMA 7-19 GM/118ML RE ENEM
1.0000 | ENEMA | Freq: Once | RECTAL | Status: AC
  Administered 2017-12-10: 19:00:00 via RECTAL
  Filled 2017-12-10: qty 1

## 2017-12-10 NOTE — Progress Notes (Signed)
Inpatient Progress Note: Gyn-Onc  CC:  AKI Anemia Constipation Stage IV uterine cancer Abdominal pain  Assessment/Plan:  Robyn. LOUAN BASE  is a 62 y.o.  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 She also has acute blood loss anemia.  AKI: baseline creatinine elevated at 1.4 representing some baseline renal dysfunction. AKI is improving with volume resuscitation. Will continue today at 150cc/hr. Will change to D5/NS due to mild hyponatremia. Recommend holding nephrotoxic agents such as lovenox and ibuprofen.  Anemia: Hb stable. No signs of active bleeding. Will hold off iron supplementation while constipated.  Constipation: aggressive bowel regimen. Try senakot nightly and mag citrate x 1.  Mag citrate was ordered yesterday but just now being administered on HD2.  She does not have clinical obstruction.  Abdominal pain: oral meds (percocet) and IV dilaudid prn for pain. Minimize opioids due to constipation. Use tylenol also. If pain not improved after clearing constipation, would recommend CT abd/pelvis to evaluate for occult source of intra-abdominal pathology causing pain.  Clinically no sign of infection (eg abscess or perforation).   HPI: Robyn Guerra is a 62 year old nulliparous woman who is seen in consultation at the request of Dr Ihor Dow for high grade endometrial cancer, fibroid uterus.  The patient reports vaginal bleeding for since December, 2017. It is daily but light. She was evaluated with a TVUS on 10/03/16 which showed a uterus with measurements: 14.4 x 5.8 x 9.7 cm. There appears to be a 3.8 x 3.2 x 3.0 cm right posterior submucosal fibroid abutting the endometrial canal. A slightly larger 4.8 x 4.1 x 3.0 cm left posterior subserosal fibroid is also seen. The uterus is retroverted in nature. The endometrium was thickened to 1.8 cm.  Vague debris and trace fluid are seen within the lower uterine segment and cervix.    She continued to have vaginal bleeding and a repeat US was performed on 08/19/18 which showed uterine measurements: 14.5 x 10.4 x 10.9 cm. Multiple uterine fibroids are seen. The largest fibroid measuring 4.7 cm, as well as a second fibroid measuring 2.8 cm, are located centrally consistent with submucosal fibroids. The posterior subserosal fibroid is also seen measuring 4.4 cm . These show no significant change compared to prior study. Endometrium thickness could not be visualized due to acoustic shadowing from fibroids described above.  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   CT abdo/pelvis on 10/31/17 showed a few small less than 1 cm retroperitoneal lymph nodes are seen in the left paraaortic and aortocaval spaces. 10 mm right lower quadrant mesenteric lymph node is seen. No abdominal aortic aneurysm. Aortic atherosclerosis.  Enlarged uterus measuring 19.5 cm in length. Multiple small uterine fibroids are seen, largest measuring approximately 5cm. Diffuse endometrial thickening is seen measuring 24 mm,consistent with history of known endometrial carcinosarcoma. Poorly defined heterogeneously enhancing mass is seen in the right adnexa abutting the uterus, which measures 6.1 x 5.5 cm. This is suspicious for extra uterine extension of malignancy. No evidence of ascites.  On 11/24/17 she underwent ex lap TAH, BSO, omentectomy and radical tumor debulking.  She did well postoperatively however then subsequently developed severe constipation and bloody stools from her bleeding hemorrhoids.  She  held the Lovenox for approximately 2 days on March 30 and April 1 however then resumed it.  She P try to avoid Percocet although  she does have abdominal pain from her constipation.  She had one episode of emesis on March 28 but none since that time.  She is states she is able to keep down oral agents food and liquids.  She is passing flatus.  She denies fevers.  A small amount of her wound has opened at the inferior aspect.  Labs were checked on 12/08/2017 which revealed a mildly elevated white count 13,000, anemia with a hemoglobin of 10.5, thrombocytosis at 718.  Her creatinine was substantially elevated to 2.6.  Of note her preoperative and postoperative creatinine had been stable at 1.4.  Interval Hx:  Afebrile overnight, but abdominal pain persistent. Did not receive bowel regimen as ordered (just now receiving mag citrate).   Labs improving with hydration including improved creatinine and WBC.  Hb stable. Tolerating PO and passing small amount of flatus but no BM.     Current Meds:  Outpatient Encounter Medications as of 11/01/2017  Medication Sig  . acetaminophen (TYLENOL) 500 MG tablet Take 1,000 mg by mouth every 6 (six) hours as needed for moderate pain.  . hydrochlorothiazide (HYDRODIURIL) 25 MG tablet Take 1 tablet (25 mg total) by mouth daily.  Marland Kitchen ibuprofen (ADVIL,MOTRIN) 600 MG tablet Take 1 tablet (600 mg total) by mouth every 6 (six) hours as needed.  . megestrol (MEGACE) 40 MG tablet Take 1 tablet (40 mg total) by mouth 2 (two) times daily.  . [DISCONTINUED] misoprostol (CYTOTEC) 200 MCG tablet Place two tablets by mouth 6-8 hours prior to your next clinic appointment   No facility-administered encounter medications on file as of 11/01/2017.     Allergy:  Allergies  Allergen Reactions  . Codeine Nausea And Vomiting    Room spins    Social Hx:   Social History   Socioeconomic History  . Marital status: Married    Spouse name: Not on file  . Number of children: 0  . Years of education: Not on file  . Highest education level: Not on file  Occupational History  . Occupation: unemployed   Social Needs  . Financial resource strain: Not on file  . Food insecurity:    Worry: Not on file    Inability: Not on file  . Transportation needs:    Medical: Not on file    Non-medical: Not on file  Tobacco Use  . Smoking status: Former Smoker    Years: 4.00    Types: Cigarettes    Last attempt to quit: 10/18/1978    Years since quitting: 39.1  . Smokeless tobacco: Never Used  . Tobacco comment: smoked cigarrettes when she was 40 to age 45 then quit.   Substance and Sexual Activity  . Alcohol use: Yes    Comment: rarely  . Drug use: No  . Sexual activity: Not on file  Lifestyle  . Physical activity:    Days per week: Not on file    Minutes per session: Not on file  . Stress: Not on file  Relationships  . Social connections:    Talks on phone: Not on file    Gets together: Not on file    Attends religious service: Not on file    Active member of club or organization: Not on file    Attends meetings of clubs or organizations: Not on file    Relationship status:  Not on file  . Intimate partner violence:    Fear of current or ex partner: Not on file    Emotionally abused: Not on file    Physically abused: Not on file    Forced sexual activity: Not on file  Other Topics Concern  . Not on file  Social History Narrative  . Not on file    Past Surgical Hx:  Past Surgical History:  Procedure Laterality Date  . ABDOMINAL HYSTERECTOMY N/A 11/24/2017   Procedure: TOTAL HYSTERECTOMY ABDOMINALGREATER 250 GRAMS;  Surgeon: Everitt Amber, MD;  Location: [facility];  Service: Gynecology;  Laterality: N/A;  . DEBULKING N/A 11/24/2017   Procedure: RADICAL TUMOR DEBULKING;  Surgeon: Everitt Amber, MD;  Location: [facility];  Service: Gynecology;  Laterality: N/A;  . LAPAROTOMY N/A 11/24/2017   Procedure: EXPLORATORY LAPAROTOMY;  Surgeon: Everitt Amber, MD;  Location: [facility];  Service: Gynecology;  Laterality: N/A;  . LYMPH NODE DISSECTION N/A 11/24/2017   Procedure: OMENTECTOMY;  Surgeon: Everitt Amber, MD;  Location: [facility];  Service: Gynecology;  Laterality: N/A;  . MYOMECTOMY    . SALPINGOOPHORECTOMY Bilateral 11/24/2017   Procedure: Merlene Laughter SALPINGO OOPHORECTOMY;  Surgeon: Everitt Amber, MD;  Location: [facility];  Service: Gynecology;  Laterality: Bilateral;    Past Medical Hx:  Past Medical History:  Diagnosis Date  . Cancer [facility])    endometrial cancer  . Fibroids   . GERD (gastroesophageal reflux disease)   . Hypertension   . Sinusitis     Past Gynecological History:  Myomectomy in her 41's for symptomatic uterine fibroids. Patient's last menstrual period was 04/20/2016 (within weeks).  Family Hx:  Family History  Problem Relation Age of Onset  . Diabetes Mother   . Prostate cancer Brother 102  . Colon cancer Paternal Grandmother   . Prostate cancer Brother 75    Review of Systems:  Constitutional  Feels well,  ENT Normal appearing ears and nares bilaterally Skin/Breast  No rash, sores, jaundice, itching, dryness Cardiovascular  No chest pain, shortness of breath, or edema  Pulmonary  No cough or wheeze.  Gastro Intestinal  No nausea, vomitting, or diarrhoea. No bright red blood per rectum, no abdominal pain, change in bowel movement, or constipation.  Genito Urinary  No frequency, urgency, dysuria, + bleeding.  Musculo Skeletal  No myalgia, arthralgia, joint swelling or pain  Neurologic  No weakness, numbness, change in gait,  Psychology  No depression, anxiety, insomnia.   Labs:  CBC    Component Value Date/Time   WBC 11.6 (H) 12/10/2017 0347   RBC 4.11 12/10/2017 0347   HGB 9.5 (L) 12/10/2017 0347   HCT 29.7 (L) 12/10/2017 0347   PLT 704 (H) 12/10/2017 0347   MCV 72.3 (L) 12/10/2017 0347   MCH 23.1 (L) 12/10/2017 0347   MCHC 32.0 12/10/2017 0347   RDW 19.0 (H) 12/10/2017 0347   LYMPHSABS 1.2 12/11/2017 1443   MONOABS 1.0 (H) 12/30/2017 1443   EOSABS 0.4 12/10/2017 1443   BASOSABS 0.1 01/03/2018 1443   BMET    Component Value Date/Time    NA 133 (L) 12/10/2017 0347   NA 140 10/27/2017 1423   K 3.6 12/10/2017 0347   CL 100 (L) 12/10/2017 0347   CO2 23 12/10/2017 0347   GLUCOSE 136 (H) 12/10/2017 0347   BUN 33 (H) 12/10/2017 0347   BUN 14 10/27/2017 1423   CREATININE 2.05 (H) 12/10/2017 0347   CALCIUM 8.8 (L) 12/10/2017 0347   GFRNONAA 25 (L) 12/10/2017 0277  GFRAA 29 (L) 12/10/2017 0347     Vitals:  Blood pressure (!) 144/91, pulse 97, temperature 99.1 F (37.3 C), temperature source Oral, resp. rate 18, height 5\' 4"  (1.626 m), weight 242 lb 15.2 oz (110.2 kg), last menstrual period 04/20/2016, SpO2 98 %.  Physical Exam: WD in NAD, in no distress Neck  Supple NROM, without any enlargements.  Lymph Node Survey No cervical supraclavicular or inguinal adenopathy Cardiovascular  Pulse normal rate, regularity and rhythm. S1 and S2 normal.  Lungs  Clear to auscultation bilateraly, without wheezes/crackles/rhonchi. Good air movement.  Skin  No rash/lesions/breakdown  Psychiatry  Alert and oriented to person, place, and time  Abdomen  Normoactive bowel sounds, abdomen soft, obese, distended but not tympanitic. No rebound or guarding. Incision in tact and free of erythema.2cm of inferior wound separation. No drainage. Stable abdominal exam findings. Back No CVA tenderness Genito Urinary  deferred Rectal  deferred Extremities  No bilateral cyanosis, clubbing or edema.   Thereasa Solo, MD  12/10/2017, 10:25 AM

## 2017-12-10 NOTE — Progress Notes (Signed)
Pt was sitting on the side of the bed in tears comparing of pain in her abdomen. She stated the Mag Citrate always made her abdomen hurt .She also stated that she can't have a BM. I gave her 0.5 mg of dilaudid.  1450. Pt stated that the pain had lessened to a 5/10. Passing gas but no BM.  1725 Oxycodone 5 mg po given for pain of 8/10. Bowels sounds hypoactive in the RUQ.    1820. She still c/o of pain "as if she has air moving im her intestines?   1830 I spoke to Dr Lianne Bushy by phone and gave her this report. She gave an order for a fleets enema

## 2017-12-11 DIAGNOSIS — C55 Malignant neoplasm of uterus, part unspecified: Secondary | ICD-10-CM | POA: Diagnosis not present

## 2017-12-11 DIAGNOSIS — N179 Acute kidney failure, unspecified: Secondary | ICD-10-CM | POA: Diagnosis not present

## 2017-12-11 LAB — COMPREHENSIVE METABOLIC PANEL
ALBUMIN: 2.4 g/dL — AB (ref 3.5–5.0)
ALT: 16 U/L (ref 14–54)
ANION GAP: 9 (ref 5–15)
AST: 15 U/L (ref 15–41)
Alkaline Phosphatase: 75 U/L (ref 38–126)
BUN: 23 mg/dL — ABNORMAL HIGH (ref 6–20)
CHLORIDE: 101 mmol/L (ref 101–111)
CO2: 24 mmol/L (ref 22–32)
Calcium: 8.6 mg/dL — ABNORMAL LOW (ref 8.9–10.3)
Creatinine, Ser: 1.37 mg/dL — ABNORMAL HIGH (ref 0.44–1.00)
GFR calc non Af Amer: 41 mL/min — ABNORMAL LOW (ref >60)
GFR, EST AFRICAN AMERICAN: 48 mL/min — AB (ref >60)
GLUCOSE: 133 mg/dL — AB (ref 65–99)
Potassium: 3.4 mmol/L — ABNORMAL LOW (ref 3.5–5.1)
SODIUM: 134 mmol/L — AB (ref 135–145)
Total Bilirubin: 0.4 mg/dL (ref 0.3–1.2)
Total Protein: 7.3 g/dL (ref 6.5–8.1)

## 2017-12-11 LAB — CBC
HEMATOCRIT: 28.8 % — AB (ref 36.0–46.0)
HEMOGLOBIN: 9 g/dL — AB (ref 12.0–15.0)
MCH: 22.7 pg — ABNORMAL LOW (ref 26.0–34.0)
MCHC: 31.3 g/dL (ref 30.0–36.0)
MCV: 72.7 fL — AB (ref 78.0–100.0)
Platelets: 690 10*3/uL — ABNORMAL HIGH (ref 150–400)
RBC: 3.96 MIL/uL (ref 3.87–5.11)
RDW: 18.7 % — ABNORMAL HIGH (ref 11.5–15.5)
WBC: 11.4 10*3/uL — ABNORMAL HIGH (ref 4.0–10.5)

## 2017-12-11 MED ORDER — ZOLPIDEM TARTRATE 5 MG PO TABS
5.0000 mg | ORAL_TABLET | Freq: Every evening | ORAL | Status: DC | PRN
  Administered 2017-12-11 – 2017-12-15 (×4): 5 mg via ORAL
  Filled 2017-12-11 (×4): qty 1

## 2017-12-11 MED ORDER — SIMETHICONE 80 MG PO CHEW
80.0000 mg | CHEWABLE_TABLET | Freq: Four times a day (QID) | ORAL | Status: DC | PRN
  Administered 2017-12-11 – 2017-12-15 (×5): 80 mg via ORAL
  Filled 2017-12-11 (×5): qty 1

## 2017-12-11 MED ORDER — FLEET ENEMA 7-19 GM/118ML RE ENEM
1.0000 | ENEMA | Freq: Two times a day (BID) | RECTAL | Status: DC | PRN
  Administered 2017-12-11 (×2): 1 via RECTAL
  Filled 2017-12-11 (×2): qty 1

## 2017-12-11 MED ORDER — MAGNESIUM HYDROXIDE 400 MG/5ML PO SUSP
30.0000 mL | Freq: Once | ORAL | Status: AC
  Administered 2017-12-11: 30 mL via ORAL
  Filled 2017-12-11: qty 30

## 2017-12-11 MED ORDER — MAGNESIUM HYDROXIDE 400 MG/5ML PO SUSP
30.0000 mL | Freq: Every day | ORAL | Status: DC
  Administered 2017-12-11 – 2017-12-15 (×4): 30 mL via ORAL
  Filled 2017-12-11 (×3): qty 30

## 2017-12-11 NOTE — Progress Notes (Addendum)
Pt has small results from the fleets enema. Brown liquid in the toilet. She c/o of mild discomfort.  1630 pt appears to be is moderate distress with pain in her abdomen. I gave her oxycodone 5 mg po .I spoke woth Dr Denman George and received an order for 30 ml of milk of magnesia  1640 2nd enema given. She had a moderate amount  Of darker brown loose stool. She stated that she may have moved some soft stool.    She has had a shower and denies pain at this time

## 2017-12-11 NOTE — Progress Notes (Signed)
Inpatient Progress Note: Gyn-Onc  CC:  AKI Anemia Constipation Stage IV uterine cancer Abdominal pain  Assessment/Plan:  Robyn. Robyn Guerra  is a 61 y.o.  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 She also has acute blood loss anemia.  AKI: baseline creatinine elevated at 1.4 . Has now returned to baseline (1.2 today). Will d.c. IVF's as patient is orally hydrating.  Recommend continue to hold nephrotoxic agents such as lovenox and ibuprofen.  Anemia: Hb stable. No signs of active bleeding. Will hold off iron supplementation while constipated. Will discharge home on ferrous gluconate.  Constipation: Somewhat relieved with fleets enema (mag citrate did not give response). Will repeat enema in am and pm until successful evacuation.  Milk of Mag today. Continue senokot.  Recommended prune juice multiple times per day and frequent ambulation.  She does not have clinical obstruction.  Abdominal pain: oral meds (percocet) and IV dilaudid prn for pain. Minimize opioids due to constipation. Use tylenol also. If pain not improved after clearing constipation today will order CT abd/pelvis to evaluate for occult source of intra-abdominal pathology causing pain.  Clinically no sign of infection (eg abscess or perforation) or obstruction and normal labs and vital signs therefore my suspicion for intraabdominal process (other than primary constipation from delayed bowel transit) is low.   HPI: Robyn Guerra is a 62 year old nulliparous woman who is seen in consultation at the request of Dr Ihor Dow for high grade endometrial cancer, fibroid uterus.  The patient reports vaginal bleeding for since December, 2017. It is daily but light. She was evaluated with a TVUS on 10/03/16 which showed a uterus with measurements: 14.4 x 5.8 x 9.7 cm. There appears to be a 3.8 x  3.2 x 3.0 cm right posterior submucosal fibroid abutting the endometrial canal. A slightly larger 4.8 x 4.1 x 3.0 cm left posterior subserosal fibroid is also seen. The uterus is retroverted in nature. The endometrium was thickened to 1.8 cm. Vague debris and trace fluid are seen within the lower uterine segment and cervix.    She continued to have vaginal bleeding and a repeat US was performed on 08/19/18 which showed uterine measurements: 14.5 x 10.4 x 10.9 cm. Multiple uterine fibroids are seen. The largest fibroid measuring 4.7 cm, as well as a second fibroid measuring 2.8 cm, are located centrally consistent with submucosal fibroids. The posterior subserosal fibroid is also seen measuring 4.4 cm . These show no significant change compared to prior study. Endometrium thickness could not be visualized due to acoustic shadowing from fibroids described above.  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   CT abdo/pelvis on 10/31/17 showed a few small less than 1 cm retroperitoneal lymph nodes are seen in the left paraaortic and aortocaval spaces. 10 mm right lower quadrant mesenteric lymph node is seen. No abdominal aortic aneurysm. Aortic atherosclerosis.  Enlarged uterus measuring 19.5 cm in length. Multiple small uterine fibroids are seen, largest measuring approximately 5cm. Diffuse endometrial thickening is seen measuring 24 mm,consistent with history of known endometrial carcinosarcoma. Poorly defined heterogeneously enhancing mass is seen in the right adnexa abutting the uterus, which measures 6.1 x 5.5 cm. This is suspicious for extra uterine  extension of malignancy. No evidence of ascites.  On 11/24/17 she underwent ex lap TAH, BSO, omentectomy and radical tumor debulking.  She did well  postoperatively however then subsequently developed severe constipation and bloody stools from her bleeding hemorrhoids.  She held the Lovenox for approximately 2 days on March 30 and April 1 however then resumed it.  She P try to avoid Percocet although she does have abdominal pain from her constipation.  She had one episode of emesis on March 28 but none since that time.  She is states she is able to keep down oral agents food and liquids.  She is passing flatus.  She denies fevers.  A small amount of her wound has opened at the inferior aspect.  Labs were checked on 12/28/2017 which revealed a mildly elevated white count 13,000, anemia with a hemoglobin of 10.5, thrombocytosis at 718.  Her creatinine was substantially elevated to 2.6.  Of note her preoperative and postoperative creatinine had been stable at 1.4.  Interval Hx:  Cramping abdominal pain yesterday after mag citrate but no BM. Given fleets enema which produced small BM overnight. Some relief (but not complete) after this. Continues to pass flatus. Continues to tolerate PO with no emesis, though appetite poor. No bleeding. Vital signs remain within normal limits. Creatinine normalized to baseline and voiding appropriate UO.    Current Meds:  Outpatient Encounter Medications as of 11/01/2017  Medication Sig  . acetaminophen (TYLENOL) 500 MG tablet Take 1,000 mg by mouth every 6 (six) hours as needed for moderate pain.  . hydrochlorothiazide (HYDRODIURIL) 25 MG tablet Take 1 tablet (25 mg total) by mouth daily.  Marland Kitchen ibuprofen (ADVIL,MOTRIN) 600 MG tablet Take 1 tablet (600 mg total) by mouth every 6 (six) hours as needed.  . megestrol (MEGACE) 40 MG tablet Take 1 tablet (40 mg total) by mouth 2 (two) times daily.  . [DISCONTINUED] misoprostol (CYTOTEC) 200 MCG tablet Place two tablets by mouth 6-8 hours prior to your next clinic appointment   No facility-administered encounter medications on file as of 11/01/2017.     Allergy:   Allergies  Allergen Reactions  . Codeine Nausea And Vomiting    Room spins    Social Hx:   Social History   Socioeconomic History  . Marital status: Married    Spouse name: Not on file  . Number of children: 0  . Years of education: Not on file  . Highest education level: Not on file  Occupational History  . Occupation: unemployed  Social Needs  . Financial resource strain: Not on file  . Food insecurity:    Worry: Not on file    Inability: Not on file  . Transportation needs:    Medical: Not on file    Non-medical: Not on file  Tobacco Use  . Smoking status: Former Smoker    Years: 4.00    Types: Cigarettes    Last attempt to quit: 10/18/1978    Years since quitting: 39.1  . Smokeless tobacco: Never Used  . Tobacco comment: smoked cigarrettes when she was 29 to age 43 then quit.   Substance and Sexual Activity  . Alcohol use: Yes    Comment: rarely  . Drug use: No  . Sexual activity: Not on file  Lifestyle  . Physical activity:    Days per week: Not on file    Minutes per session: Not on file  . Stress: Not on file  Relationships  . Social connections:  Talks on phone: Not on file    Gets together: Not on file    Attends religious service: Not on file    Active member of club or organization: Not on file    Attends meetings of clubs or organizations: Not on file    Relationship status: Not on file  . Intimate partner violence:    Fear of current or ex partner: Not on file    Emotionally abused: Not on file    Physically abused: Not on file    Forced sexual activity: Not on file  Other Topics Concern  . Not on file  Social History Narrative  . Not on file    Past Surgical Hx:  Past Surgical History:  Procedure Laterality Date  . ABDOMINAL HYSTERECTOMY N/A 11/24/2017   Procedure: TOTAL HYSTERECTOMY ABDOMINALGREATER 250 GRAMS;  Surgeon: Everitt Amber, MD;  Location: [facility];  Service: Gynecology;  Laterality: N/A;  . DEBULKING N/A 11/24/2017    Procedure: RADICAL TUMOR DEBULKING;  Surgeon: Everitt Amber, MD;  Location: [facility];  Service: Gynecology;  Laterality: N/A;  . LAPAROTOMY N/A 11/24/2017   Procedure: EXPLORATORY LAPAROTOMY;  Surgeon: Everitt Amber, MD;  Location: [facility];  Service: Gynecology;  Laterality: N/A;  . LYMPH NODE DISSECTION N/A 11/24/2017   Procedure: OMENTECTOMY;  Surgeon: Everitt Amber, MD;  Location: [facility];  Service: Gynecology;  Laterality: N/A;  . MYOMECTOMY    . SALPINGOOPHORECTOMY Bilateral 11/24/2017   Procedure: Merlene Laughter SALPINGO OOPHORECTOMY;  Surgeon: Everitt Amber, MD;  Location: [facility];  Service: Gynecology;  Laterality: Bilateral;    Past Medical Hx:  Past Medical History:  Diagnosis Date  . Cancer [facility] [place])    endometrial cancer  . Fibroids   . GERD (gastroesophageal reflux disease)   . Hypertension   . Sinusitis     Past Gynecological History:  Myomectomy in her 88's for symptomatic uterine fibroids. Patient's last menstrual period was 04/20/2016 (within weeks).  Family Hx:  Family History  Problem Relation Age of Onset  . Diabetes Mother   . Prostate cancer Brother 67  . Colon cancer Paternal Grandmother   . Prostate cancer Brother 12    Review of Systems:  Constitutional  Feels well,  ENT Normal appearing ears and nares bilaterally Skin/Breast  No rash, sores, jaundice, itching, dryness Cardiovascular  No chest pain, shortness of breath, or edema  Pulmonary  No cough or wheeze.  Gastro Intestinal  + constipation, minimal BM's. No nausea or emesis Genito Urinary  No frequency, urgency, dysuria, no bleeding.  Musculo Skeletal  No myalgia, arthralgia, joint swelling or pain  Neurologic  No weakness, numbness, change in gait,  Psychology  No depression, anxiety, insomnia.   Labs:  CBC    Component Value Date/Time   WBC 11.4 (H) 12/11/2017 0421   RBC 3.96 12/11/2017 0421   HGB 9.0 (L) 12/11/2017 0421   HCT 28.8 (L) 12/11/2017 0421   PLT 690 (H) 12/11/2017 0421   MCV 72.7 (L)  12/11/2017 0421   MCH 22.7 (L) 12/11/2017 0421   MCHC 31.3 12/11/2017 0421   RDW 18.7 (H) 12/11/2017 0421   LYMPHSABS 1.2 12/16/2017 1443   MONOABS 1.0 (H) 12/10/2017 1443   EOSABS 0.4 12/25/2017 1443   BASOSABS 0.1 12/15/2017 1443   BMET    Component Value Date/Time   NA 134 (L) 12/11/2017 0421   NA 140 10/27/2017 1423   K 3.4 (L) 12/11/2017 0421   CL 101 12/11/2017 0421   CO2 24 12/11/2017 0421  GLUCOSE 133 (H) 12/11/2017 0421   BUN 23 (H) 12/11/2017 0421   BUN 14 10/27/2017 1423   CREATININE 1.37 (H) 12/11/2017 0421   CALCIUM 8.6 (L) 12/11/2017 0421   GFRNONAA 41 (L) 12/11/2017 0421   GFRAA 48 (L) 12/11/2017 0421     Vitals:  Blood pressure 140/81, pulse 99, temperature 99.3 F (37.4 C), temperature source Oral, resp. rate 18, height 5\' 4"  (1.626 m), weight 242 lb 15.2 oz (110.2 kg), last menstrual period 04/20/2016, SpO2 97 %.  Physical Exam: WD in NAD, in no distress Neck  Supple NROM, without any enlargements.  Lymph Node Survey No cervical supraclavicular or inguinal adenopathy Cardiovascular  Pulse normal rate, regularity and rhythm. S1 and S2 normal.  Lungs  Clear to auscultation bilateraly, without wheezes/crackles/rhonchi. Good air movement.  Skin  No rash/lesions/breakdown  Psychiatry  Alert and oriented to person, place, and time  Abdomen  Normoactive bowel sounds, abdomen soft, obese, distended but not tympanitic. No rebound or guarding. Incision in tact and free of erythema. 2cm of inferior wound separation. No drainage. Stable abdominal exam findings. Back No CVA tenderness Genito Urinary  deferred Rectal  deferred Extremities  No bilateral cyanosis, clubbing or edema.   Thereasa Solo, MD  12/11/2017, 10:19 AM

## 2017-12-12 ENCOUNTER — Observation Stay (HOSPITAL_COMMUNITY): Payer: BLUE CROSS/BLUE SHIELD

## 2017-12-12 DIAGNOSIS — C787 Secondary malignant neoplasm of liver and intrahepatic bile duct: Secondary | ICD-10-CM | POA: Diagnosis present

## 2017-12-12 DIAGNOSIS — I700 Atherosclerosis of aorta: Secondary | ICD-10-CM | POA: Diagnosis present

## 2017-12-12 DIAGNOSIS — Z515 Encounter for palliative care: Secondary | ICD-10-CM | POA: Diagnosis not present

## 2017-12-12 DIAGNOSIS — C786 Secondary malignant neoplasm of retroperitoneum and peritoneum: Secondary | ICD-10-CM | POA: Diagnosis present

## 2017-12-12 DIAGNOSIS — E440 Moderate protein-calorie malnutrition: Secondary | ICD-10-CM | POA: Diagnosis present

## 2017-12-12 DIAGNOSIS — Z87891 Personal history of nicotine dependence: Secondary | ICD-10-CM | POA: Diagnosis not present

## 2017-12-12 DIAGNOSIS — Z885 Allergy status to narcotic agent status: Secondary | ICD-10-CM | POA: Diagnosis not present

## 2017-12-12 DIAGNOSIS — I129 Hypertensive chronic kidney disease with stage 1 through stage 4 chronic kidney disease, or unspecified chronic kidney disease: Secondary | ICD-10-CM | POA: Diagnosis present

## 2017-12-12 DIAGNOSIS — C801 Malignant (primary) neoplasm, unspecified: Secondary | ICD-10-CM | POA: Diagnosis not present

## 2017-12-12 DIAGNOSIS — R0902 Hypoxemia: Secondary | ICD-10-CM | POA: Diagnosis not present

## 2017-12-12 DIAGNOSIS — N183 Chronic kidney disease, stage 3 (moderate): Secondary | ICD-10-CM | POA: Diagnosis present

## 2017-12-12 DIAGNOSIS — K56609 Unspecified intestinal obstruction, unspecified as to partial versus complete obstruction: Secondary | ICD-10-CM | POA: Diagnosis present

## 2017-12-12 DIAGNOSIS — Z9911 Dependence on respirator [ventilator] status: Secondary | ICD-10-CM | POA: Diagnosis not present

## 2017-12-12 DIAGNOSIS — R180 Malignant ascites: Secondary | ICD-10-CM | POA: Diagnosis present

## 2017-12-12 DIAGNOSIS — D62 Acute posthemorrhagic anemia: Secondary | ICD-10-CM | POA: Diagnosis present

## 2017-12-12 DIAGNOSIS — C541 Malignant neoplasm of endometrium: Secondary | ICD-10-CM | POA: Diagnosis not present

## 2017-12-12 DIAGNOSIS — N19 Unspecified kidney failure: Secondary | ICD-10-CM | POA: Diagnosis present

## 2017-12-12 DIAGNOSIS — C55 Malignant neoplasm of uterus, part unspecified: Secondary | ICD-10-CM | POA: Diagnosis not present

## 2017-12-12 DIAGNOSIS — D509 Iron deficiency anemia, unspecified: Secondary | ICD-10-CM | POA: Diagnosis not present

## 2017-12-12 DIAGNOSIS — I361 Nonrheumatic tricuspid (valve) insufficiency: Secondary | ICD-10-CM | POA: Diagnosis not present

## 2017-12-12 DIAGNOSIS — J9601 Acute respiratory failure with hypoxia: Secondary | ICD-10-CM | POA: Diagnosis not present

## 2017-12-12 DIAGNOSIS — R110 Nausea: Secondary | ICD-10-CM | POA: Diagnosis not present

## 2017-12-12 DIAGNOSIS — Z7189 Other specified counseling: Secondary | ICD-10-CM | POA: Diagnosis not present

## 2017-12-12 DIAGNOSIS — G893 Neoplasm related pain (acute) (chronic): Secondary | ICD-10-CM | POA: Diagnosis not present

## 2017-12-12 DIAGNOSIS — Z6841 Body Mass Index (BMI) 40.0 and over, adult: Secondary | ICD-10-CM | POA: Diagnosis not present

## 2017-12-12 DIAGNOSIS — N179 Acute kidney failure, unspecified: Secondary | ICD-10-CM | POA: Diagnosis present

## 2017-12-12 DIAGNOSIS — I4891 Unspecified atrial fibrillation: Secondary | ICD-10-CM | POA: Diagnosis not present

## 2017-12-12 DIAGNOSIS — I248 Other forms of acute ischemic heart disease: Secondary | ICD-10-CM | POA: Diagnosis not present

## 2017-12-12 DIAGNOSIS — I2699 Other pulmonary embolism without acute cor pulmonale: Secondary | ICD-10-CM | POA: Diagnosis not present

## 2017-12-12 DIAGNOSIS — J810 Acute pulmonary edema: Secondary | ICD-10-CM | POA: Diagnosis not present

## 2017-12-12 DIAGNOSIS — K567 Ileus, unspecified: Secondary | ICD-10-CM | POA: Diagnosis present

## 2017-12-12 DIAGNOSIS — E860 Dehydration: Secondary | ICD-10-CM | POA: Diagnosis present

## 2017-12-12 DIAGNOSIS — J9600 Acute respiratory failure, unspecified whether with hypoxia or hypercapnia: Secondary | ICD-10-CM | POA: Diagnosis not present

## 2017-12-12 DIAGNOSIS — T8131XA Disruption of external operation (surgical) wound, not elsewhere classified, initial encounter: Secondary | ICD-10-CM | POA: Diagnosis present

## 2017-12-12 DIAGNOSIS — R64 Cachexia: Secondary | ICD-10-CM | POA: Diagnosis not present

## 2017-12-12 DIAGNOSIS — E871 Hypo-osmolality and hyponatremia: Secondary | ICD-10-CM | POA: Diagnosis present

## 2017-12-12 DIAGNOSIS — J690 Pneumonitis due to inhalation of food and vomit: Secondary | ICD-10-CM | POA: Diagnosis not present

## 2017-12-12 DIAGNOSIS — Z90710 Acquired absence of both cervix and uterus: Secondary | ICD-10-CM | POA: Diagnosis not present

## 2017-12-12 LAB — COMPREHENSIVE METABOLIC PANEL
ALT: 17 U/L (ref 14–54)
ANION GAP: 11 (ref 5–15)
AST: 15 U/L (ref 15–41)
Albumin: 2.5 g/dL — ABNORMAL LOW (ref 3.5–5.0)
Alkaline Phosphatase: 75 U/L (ref 38–126)
BUN: 19 mg/dL (ref 6–20)
CHLORIDE: 102 mmol/L (ref 101–111)
CO2: 22 mmol/L (ref 22–32)
CREATININE: 1.18 mg/dL — AB (ref 0.44–1.00)
Calcium: 8.7 mg/dL — ABNORMAL LOW (ref 8.9–10.3)
GFR, EST AFRICAN AMERICAN: 57 mL/min — AB (ref >60)
GFR, EST NON AFRICAN AMERICAN: 49 mL/min — AB (ref >60)
Glucose, Bld: 111 mg/dL — ABNORMAL HIGH (ref 65–99)
Potassium: 3.7 mmol/L (ref 3.5–5.1)
SODIUM: 135 mmol/L (ref 135–145)
Total Bilirubin: 0.4 mg/dL (ref 0.3–1.2)
Total Protein: 7.1 g/dL (ref 6.5–8.1)

## 2017-12-12 LAB — CBC
HCT: 28.4 % — ABNORMAL LOW (ref 36.0–46.0)
HEMOGLOBIN: 8.9 g/dL — AB (ref 12.0–15.0)
MCH: 23.2 pg — AB (ref 26.0–34.0)
MCHC: 31.3 g/dL (ref 30.0–36.0)
MCV: 74 fL — AB (ref 78.0–100.0)
PLATELETS: 698 10*3/uL — AB (ref 150–400)
RBC: 3.84 MIL/uL — AB (ref 3.87–5.11)
RDW: 18.9 % — ABNORMAL HIGH (ref 11.5–15.5)
WBC: 10.3 10*3/uL (ref 4.0–10.5)

## 2017-12-12 LAB — IRON AND TIBC
Iron: 12 ug/dL — ABNORMAL LOW (ref 41–142)
SATURATION RATIOS: 6 % — AB (ref 21–57)
TIBC: 186 ug/dL — ABNORMAL LOW (ref 236–444)
UIBC: 175 ug/dL

## 2017-12-12 LAB — FERRITIN: Ferritin: 246 ng/mL (ref 9–269)

## 2017-12-12 MED ORDER — IOPAMIDOL (ISOVUE-300) INJECTION 61%
15.0000 mL | Freq: Once | INTRAVENOUS | Status: AC | PRN
  Administered 2017-12-12: 15 mL via ORAL
  Filled 2017-12-12: qty 30

## 2017-12-12 MED ORDER — HYDROMORPHONE HCL 1 MG/ML IJ SOLN
0.5000 mg | INTRAMUSCULAR | Status: DC | PRN
  Administered 2017-12-12 – 2017-12-13 (×5): 0.5 mg via INTRAVENOUS
  Filled 2017-12-12 (×5): qty 1

## 2017-12-12 MED ORDER — IOHEXOL 300 MG/ML  SOLN
100.0000 mL | Freq: Once | INTRAMUSCULAR | Status: AC | PRN
  Administered 2017-12-12: 100 mL via INTRAVENOUS

## 2017-12-12 MED ORDER — POLYETHYLENE GLYCOL 3350 17 G PO PACK
17.0000 g | PACK | Freq: Two times a day (BID) | ORAL | Status: DC
  Administered 2017-12-12 – 2017-12-15 (×8): 17 g via ORAL
  Filled 2017-12-12 (×8): qty 1

## 2017-12-12 MED ORDER — IOHEXOL 300 MG/ML  SOLN: 25.0000 mL | Freq: Once | INTRAMUSCULAR | Status: DC | PRN

## 2017-12-12 NOTE — Progress Notes (Signed)
Initial Nutrition Assessment  DOCUMENTATION CODES:   Morbid obesity  INTERVENTION:   Continue Ensure Enlive po BID, each supplement provides 350 kcal and 20 grams of protein  NUTRITION DIAGNOSIS:   Inadequate oral intake related to constipation, poor appetite as evidenced by per patient/family report.  GOAL:   Patient will meet greater than or equal to 90% of their needs  MONITOR:   PO intake, Supplement acceptance, Labs, Weight trends, I & O's  REASON FOR ASSESSMENT:   Malnutrition Screening Tool    ASSESSMENT:   62 y.o.  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 She also has acute blood loss anemia.  Pt reports constipation issues "for a while now". Pt states this has affected her appetite and how much she feels like eating. Pt would feel very uncomfortable after eating. She recently ate foods such as french toast or a small serving of spaghetti but she has not been eating normal sized meals. Pt has been having BMs over the past day or so after receiving enemas. Pt was about to go to IR for CT scan at time of visit. No signs of muscle or fat depletions noted though.   Pt likes the Ensure supplements, will continue this order.   Pt reports 30 lb of weight loss which is confirmed by the chart. This is 11% wt loss x 2.5 months, significant for time frame.   Medications: Miralax packet BID, Senokot-S tablet daily Labs reviewed: GFR: 57   NUTRITION - FOCUSED PHYSICAL EXAM:  Nutrition focused physical exam shows no sign of depletion of muscle mass or body fat.  Diet Order:  Diet regular Room service appropriate? Yes; Fluid consistency: Thin  EDUCATION NEEDS:   No education needs have been identified at this time  Skin:  Skin Assessment: Reviewed RN Assessment  Last BM:  4/7  Height:   Ht Readings from Last 1 Encounters:  12/08/2017  5\' 4"  (1.626 m)    Weight:   Wt Readings from Last 1 Encounters:  12/30/2017 242 lb 15.2 oz (110.2 kg)    Ideal Body Weight:  54.5 kg  BMI:  Body mass index is 41.7 kg/m.  Estimated Nutritional Needs:   Kcal:  1700-1900  Protein:  70-80g  Fluid:  1.7L/day  Clayton Bibles, MS, RD, LDN [facility] Pager: [phone] After Hours Pager: [phone]

## 2017-12-12 NOTE — Progress Notes (Signed)
Inpatient Progress Note: Gyn-Onc  CC:  AKI Anemia Constipation Stage IV uterine cancer Abdominal pain  Assessment/Plan:  Robyn. Robyn Guerra  is a 62 y.o.  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 She also has acute blood loss anemia.  AKI: baseline creatinine elevated at 1.4 . Has now returned to baseline and is stable. Continue aggressive oral hydration.  Recommend continue to hold nephrotoxic agents such as lovenox and ibuprofen.  Anemia: Hb stable. No signs of active bleeding. Will hold off iron supplementation while constipated. Will discharge home on ferrous gluconate.  Constipation: continues to have intermittent gripey pain. Will order CT abdo/pelvis with Oral contrast to rule out occult intra-abdominal process or obstruction. Contrast will likely facilitate bowel movement. Continue miralax BID, senokot. Fleets prn.   Abdominal pain: oral meds (percocet) and IV dilaudid prn for pain. Minimize opioids due to constipation. Use tylenol also. Will order CT abd/pelvis to evaluate for occult source of intra-abdominal pathology causing pain.  Clinically no sign of infection (eg abscess or perforation) or obstruction and normal labs and vital signs therefore my suspicion for intraabdominal process (other than primary constipation from delayed bowel transit) is low.   HPI: Robyn Guerra is a 62 year old nulliparous woman who is seen in consultation at the request of Dr Ihor Dow for high grade endometrial cancer, fibroid uterus.  The patient reports vaginal bleeding for since December, 2017. It is daily but light. She was evaluated with a TVUS on 10/03/16 which showed a uterus with measurements: 14.4 x 5.8 x 9.7 cm. There appears to be a 3.8 x 3.2 x 3.0 cm right posterior submucosal fibroid abutting the endometrial canal. A slightly larger 4.8 x 4.1 x  3.0 cm left posterior subserosal fibroid is also seen. The uterus is retroverted in nature. The endometrium was thickened to 1.8 cm. Vague debris and trace fluid are seen within the lower uterine segment and cervix.    She continued to have vaginal bleeding and a repeat US was performed on 08/19/18 which showed uterine measurements: 14.5 x 10.4 x 10.9 cm. Multiple uterine fibroids are seen. The largest fibroid measuring 4.7 cm, as well as a second fibroid measuring 2.8 cm, are located centrally consistent with submucosal fibroids. The posterior subserosal fibroid is also seen measuring 4.4 cm . These show no significant change compared to prior study. Endometrium thickness could not be visualized due to acoustic shadowing from fibroids described above.  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   CT abdo/pelvis on 10/31/17 showed a few small less than 1 cm retroperitoneal lymph nodes are seen in the left paraaortic and aortocaval spaces. 10 mm right lower quadrant mesenteric lymph node is seen. No abdominal aortic aneurysm. Aortic atherosclerosis.  Enlarged uterus measuring 19.5 cm in length. Multiple small uterine fibroids are seen, largest measuring approximately 5cm. Diffuse endometrial thickening is seen measuring 24 mm,consistent with history of known endometrial carcinosarcoma. Poorly defined heterogeneously enhancing mass is seen in the right adnexa abutting the uterus, which measures 6.1 x 5.5 cm. This is suspicious for extra uterine extension of malignancy. No evidence of ascites.  On 11/24/17 she underwent ex lap TAH, BSO, omentectomy and radical tumor debulking.  She  did well postoperatively however then subsequently developed severe constipation and bloody stools from her bleeding hemorrhoids.   She held the Lovenox for approximately 2 days on March 30 and April 1 however then resumed it.  She P try to avoid Percocet although she does have abdominal pain from her constipation.  She had one episode of emesis on March 28 but none since that time.  She is states she is able to keep down oral agents food and liquids.  She is passing flatus.  She denies fevers.  A small amount of her wound has opened at the inferior aspect.  Labs were checked on 12/23/2017 which revealed a mildly elevated white count 13,000, anemia with a hemoglobin of 10.5, thrombocytosis at 718.  Her creatinine was substantially elevated to 2.6.  Of note her preoperative and postoperative creatinine had been stable at 1.4.  Interval Hx:  Cramping abdominal pain yesterday after fleets enema with some BM passed. Given fleets enema which produced small BM overnight. Some but incomplete relief. Continues to pass flatus. Continues to tolerate PO with no emesis, though appetite poor. No bleeding. Vital signs remain within normal limits. Creatinine normalized to baseline and voiding appropriate UO.    Current Meds:  Outpatient Encounter Medications as of 11/01/2017  Medication Sig  . acetaminophen (TYLENOL) 500 MG tablet Take 1,000 mg by mouth every 6 (six) hours as needed for moderate pain.  . hydrochlorothiazide (HYDRODIURIL) 25 MG tablet Take 1 tablet (25 mg total) by mouth daily.  Marland Kitchen ibuprofen (ADVIL,MOTRIN) 600 MG tablet Take 1 tablet (600 mg total) by mouth every 6 (six) hours as needed.  . megestrol (MEGACE) 40 MG tablet Take 1 tablet (40 mg total) by mouth 2 (two) times daily.  . [DISCONTINUED] misoprostol (CYTOTEC) 200 MCG tablet Place two tablets by mouth 6-8 hours prior to your next clinic appointment   No facility-administered encounter medications on file as of 11/01/2017.     Allergy:  Allergies  Allergen Reactions  . Codeine Nausea And Vomiting    Room spins    Social Hx:   Social History   Socioeconomic  History  . Marital status: Married    Spouse name: Not on file  . Number of children: 0  . Years of education: Not on file  . Highest education level: Not on file  Occupational History  . Occupation: unemployed  Social Needs  . Financial resource strain: Not on file  . Food insecurity:    Worry: Not on file    Inability: Not on file  . Transportation needs:    Medical: Not on file    Non-medical: Not on file  Tobacco Use  . Smoking status: Former Smoker    Years: 4.00    Types: Cigarettes    Last attempt to quit: 10/18/1978    Years since quitting: 39.1  . Smokeless tobacco: Never Used  . Tobacco comment: smoked cigarrettes when she was 89 to age 4 then quit.   Substance and Sexual Activity  . Alcohol use: Yes    Comment: rarely  . Drug use: No  . Sexual activity: Not on file  Lifestyle  . Physical activity:    Days per week: Not on file    Minutes per session: Not on file  . Stress: Not on file  Relationships  . Social connections:    Talks on phone: Not on file    Gets together: Not on file    Attends religious service: Not on file  Active member of club or organization: Not on file    Attends meetings of clubs or organizations: Not on file    Relationship status: Not on file  . Intimate partner violence:    Fear of current or ex partner: Not on file    Emotionally abused: Not on file    Physically abused: Not on file    Forced sexual activity: Not on file  Other Topics Concern  . Not on file  Social History Narrative  . Not on file    Past Surgical Hx:  Past Surgical History:  Procedure Laterality Date  . ABDOMINAL HYSTERECTOMY N/A 11/24/2017   Procedure: TOTAL HYSTERECTOMY ABDOMINALGREATER 250 GRAMS;  Surgeon: Everitt Amber, MD;  Location: [facility];  Service: Gynecology;  Laterality: N/A;  . DEBULKING N/A 11/24/2017   Procedure: RADICAL TUMOR DEBULKING;  Surgeon: Everitt Amber, MD;  Location: [facility];  Service: Gynecology;  Laterality: N/A;  . LAPAROTOMY N/A  11/24/2017   Procedure: EXPLORATORY LAPAROTOMY;  Surgeon: Everitt Amber, MD;  Location: [facility];  Service: Gynecology;  Laterality: N/A;  . LYMPH NODE DISSECTION N/A 11/24/2017   Procedure: OMENTECTOMY;  Surgeon: Everitt Amber, MD;  Location: [facility];  Service: Gynecology;  Laterality: N/A;  . MYOMECTOMY    . SALPINGOOPHORECTOMY Bilateral 11/24/2017   Procedure: Merlene Laughter SALPINGO OOPHORECTOMY;  Surgeon: Everitt Amber, MD;  Location: [facility];  Service: Gynecology;  Laterality: Bilateral;    Past Medical Hx:  Past Medical History:  Diagnosis Date  . Cancer [facility])    endometrial cancer  . Fibroids   . GERD (gastroesophageal reflux disease)   . Hypertension   . Sinusitis     Past Gynecological History:  Myomectomy in her 68's for symptomatic uterine fibroids. Patient's last menstrual period was 04/20/2016 (within weeks).  Family Hx:  Family History  Problem Relation Age of Onset  . Diabetes Mother   . Prostate cancer Brother 46  . Colon cancer Paternal Grandmother   . Prostate cancer Brother 35    Review of Systems:  Constitutional  Feels well,  ENT Normal appearing ears and nares bilaterally Skin/Breast  No rash, sores, jaundice, itching, dryness Cardiovascular  No chest pain, shortness of breath, or edema  Pulmonary  No cough or wheeze.  Gastro Intestinal  + constipation, minimal BM's. No nausea or emesis Genito Urinary  No frequency, urgency, dysuria, no bleeding.  Musculo Skeletal  No myalgia, arthralgia, joint swelling or pain  Neurologic  No weakness, numbness, change in gait,  Psychology  No depression, anxiety, insomnia.   Labs:  CBC    Component Value Date/Time   WBC 10.3 12/12/2017 0424   RBC 3.84 (L) 12/12/2017 0424   HGB 8.9 (L) 12/12/2017 0424   HCT 28.4 (L) 12/12/2017 0424   PLT 698 (H) 12/12/2017 0424   MCV 74.0 (L) 12/12/2017 0424   MCH 23.2 (L) 12/12/2017 0424   MCHC 31.3 12/12/2017 0424   RDW 18.9 (H) 12/12/2017 0424   LYMPHSABS 1.2 01/01/2018  1443   MONOABS 1.0 (H) 01/01/2018 1443   EOSABS 0.4 12/16/2017 1443   BASOSABS 0.1 12/19/2017 1443   BMET    Component Value Date/Time   NA 135 12/12/2017 0424   NA 140 10/27/2017 1423   K 3.7 12/12/2017 0424   CL 102 12/12/2017 0424   CO2 22 12/12/2017 0424   GLUCOSE 111 (H) 12/12/2017 0424   BUN 19 12/12/2017 0424   BUN 14 10/27/2017 1423   CREATININE 1.18 (H) 12/12/2017 0424   CALCIUM 8.7 (  L) 12/12/2017 0424   GFRNONAA 49 (L) 12/12/2017 0424   GFRAA 57 (L) 12/12/2017 0424     Vitals:  Blood pressure (!) 152/95, pulse (!) 106, temperature 99.2 F (37.3 C), temperature source Oral, resp. rate 20, height 5\' 4"  (1.626 m), weight 242 lb 15.2 oz (110.2 kg), last menstrual period 04/20/2016, SpO2 97 %.  Physical Exam: WD in NAD, in no distress Neck  Supple NROM, without any enlargements.  Lymph Node Survey No cervical supraclavicular or inguinal adenopathy Cardiovascular  Pulse normal rate, regularity and rhythm. S1 and S2 normal.  Lungs  Clear to auscultation bilateraly, without wheezes/crackles/rhonchi. Good air movement.  Skin  No rash/lesions/breakdown  Psychiatry  Alert and oriented to person, place, and time  Abdomen  Normoactive bowel sounds, abdomen soft, obese, distended but not tympanitic. No rebound or guarding. Incision in tact and free of erythema. 2cm of inferior wound separation. No drainage. Stable abdominal exam findings. Back No CVA tenderness Genito Urinary  deferred Rectal  deferred Extremities  No bilateral cyanosis, clubbing or edema.   Thereasa Solo, MD  12/12/2017, 7:26 AM

## 2017-12-13 ENCOUNTER — Inpatient Hospital Stay (HOSPITAL_COMMUNITY): Payer: BLUE CROSS/BLUE SHIELD

## 2017-12-13 LAB — BASIC METABOLIC PANEL
Anion gap: 11 (ref 5–15)
BUN: 19 mg/dL (ref 6–20)
CHLORIDE: 104 mmol/L (ref 101–111)
CO2: 21 mmol/L — AB (ref 22–32)
Calcium: 9 mg/dL (ref 8.9–10.3)
Creatinine, Ser: 1.31 mg/dL — ABNORMAL HIGH (ref 0.44–1.00)
GFR calc Af Amer: 50 mL/min — ABNORMAL LOW (ref >60)
GFR, EST NON AFRICAN AMERICAN: 43 mL/min — AB (ref >60)
GLUCOSE: 103 mg/dL — AB (ref 65–99)
POTASSIUM: 3.7 mmol/L (ref 3.5–5.1)
Sodium: 136 mmol/L (ref 135–145)

## 2017-12-13 LAB — CBC
HEMATOCRIT: 28.4 % — AB (ref 36.0–46.0)
HEMOGLOBIN: 8.9 g/dL — AB (ref 12.0–15.0)
MCH: 23 pg — AB (ref 26.0–34.0)
MCHC: 31.3 g/dL (ref 30.0–36.0)
MCV: 73.4 fL — AB (ref 78.0–100.0)
PLATELETS: 715 10*3/uL — AB (ref 150–400)
RBC: 3.87 MIL/uL (ref 3.87–5.11)
RDW: 19 % — ABNORMAL HIGH (ref 11.5–15.5)
WBC: 10.9 10*3/uL — ABNORMAL HIGH (ref 4.0–10.5)

## 2017-12-13 LAB — GRAM STAIN

## 2017-12-13 MED ORDER — LIDOCAINE HCL 1 % IJ SOLN
INTRAMUSCULAR | Status: AC
  Filled 2017-12-13: qty 10

## 2017-12-13 MED ORDER — FAMOTIDINE 20 MG PO TABS
20.0000 mg | ORAL_TABLET | Freq: Two times a day (BID) | ORAL | Status: DC
  Administered 2017-12-13 – 2017-12-15 (×5): 20 mg via ORAL
  Filled 2017-12-13 (×5): qty 1

## 2017-12-13 MED ORDER — DEXTROSE-NACL 5-0.9 % IV SOLN
INTRAVENOUS | Status: DC
  Administered 2017-12-13 – 2017-12-18 (×9): via INTRAVENOUS

## 2017-12-13 MED ORDER — OXYCODONE HCL ER 10 MG PO T12A
10.0000 mg | EXTENDED_RELEASE_TABLET | Freq: Two times a day (BID) | ORAL | Status: DC
  Administered 2017-12-13 – 2017-12-15 (×5): 10 mg via ORAL
  Filled 2017-12-13 (×5): qty 1

## 2017-12-13 MED ORDER — HYDROMORPHONE HCL 1 MG/ML IJ SOLN
1.0000 mg | INTRAMUSCULAR | Status: DC | PRN
  Administered 2017-12-13 – 2017-12-20 (×46): 1 mg via INTRAVENOUS
  Filled 2017-12-13 (×47): qty 1

## 2017-12-13 NOTE — Procedures (Signed)
Ultrasound-guided diagnostic and therapeutic paracentesis performed yielding 3 liters of hazy, blood-tinged  fluid. No immediate complications.  A portion of the fluid was submitted to the lab for preordered studies.

## 2017-12-13 NOTE — Progress Notes (Signed)
Inpatient Progress Note: Gyn-Onc  CC:  AKI Anemia Stage IV uterine cancer Abdominal pain Ascites  Assessment/Plan:  Robyn Guerra  is a 62 y.o.  year old with stage IV carcinosarcoma of the uterus s/p ex lap, TAH, BSO, omentectomy, radical tumor debulking on 11/24/17 now readmitted with postop AKI, and pain.   AKI: baseline creatinine elevated at 1.4 . Has now returned to baseline and is stable. Continue aggressive oral hydration.  Recommend continue to hold nephrotoxic agents such as lovenox and ibuprofen.  Anemia: Hb stable. No signs of active bleeding. Will hold off iron supplementation while constipated. Will discharge home on ferrous gluconate.  Ascites: secondary to stage IV uterine carcinosarcoma. S/p paracentesis today with limited pain relief  Abdominal pain: oral meds (percocet) and IV dilaudid prn for pain. CT abd/pelvis to evaluate for occult source of intra-abdominal pathology causing pain did not show apparent source (other than ascites). However, pain unrelieved with paracentesis.  Clinically no sign of infection (eg abscess or perforation) or obstruction and normal labs and vital signs therefore my suspicion for postop pathologic intraabdominal process is low.  Will try pepcid, oxycontin BID and increase prn dilaudid dose.   HPI: [name] is a 62 year old nulliparous woman who is seen in consultation at the request of Dr Ihor Dow for high grade endometrial cancer, fibroid uterus.  The patient reports vaginal bleeding for since December, 2017. It is daily but light. She was evaluated with a TVUS on 10/03/16 which showed a uterus with measurements: 14.4 x 5.8 x 9.7 cm. There appears to be a 3.8 x 3.2 x 3.0 cm right posterior submucosal fibroid abutting the endometrial canal. A slightly larger 4.8 x 4.1 x 3.0 cm left posterior subserosal fibroid is also seen. The uterus is retroverted in nature. The endometrium was thickened to 1.8 cm. Vague debris and trace  fluid are seen within the lower uterine segment and cervix.    She continued to have vaginal bleeding and a repeat US was performed on 08/19/18 which showed uterine measurements: 14.5 x 10.4 x 10.9 cm. Multiple uterine fibroids are seen. The largest fibroid measuring 4.7 cm, as well as a second fibroid measuring 2.8 cm, are located centrally consistent with submucosal fibroids. The posterior subserosal fibroid is also seen measuring 4.4 cm . These show no significant change compared to prior study. Endometrium thickness could not be visualized due to acoustic shadowing from fibroids described above.  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   CT abdo/pelvis on 10/31/17 showed a few small less than 1 cm retroperitoneal lymph nodes are seen in the left paraaortic and aortocaval spaces. 10 mm right lower quadrant mesenteric lymph node is seen. No abdominal aortic aneurysm. Aortic atherosclerosis.  Enlarged uterus measuring 19.5 cm in length. Multiple small uterine fibroids are seen, largest measuring approximately 5cm. Diffuse endometrial thickening is seen measuring 24 mm,consistent with history of known endometrial carcinosarcoma. Poorly defined heterogeneously enhancing mass is seen in the right adnexa abutting the uterus, which measures 6.1 x 5.5 cm. This is suspicious for extra uterine extension of malignancy. No evidence of ascites.  On 11/24/17 she underwent ex lap TAH, BSO, omentectomy and radical tumor debulking.  She did well postoperatively however then subsequently developed severe constipation and bloody stools from her bleeding hemorrhoids.  She held the  Lovenox for approximately 2 days on March 30 and April 1 however then resumed it.  She P try to avoid Percocet although she does have abdominal  pain from her constipation.  She had one episode of emesis on March 28 but none since that time.  She is states she is able to keep down oral agents food and liquids.  She is passing flatus.  She denies fevers.  A small amount of her wound has opened at the inferior aspect.  Labs were checked on 12/08/2017 which revealed a mildly elevated white count 13,000, anemia with a hemoglobin of 10.5, thrombocytosis at 718.  Her creatinine was substantially elevated to 2.6.  Of note her preoperative and postoperative creatinine had been stable at 1.4.  Interval Hx:  CT yesterday showed progression of disease with ascties and liver mets (new) but no obstruction, no free air, no abscesses, no bleeding. Ascites drained this morning (3 L) with minimal to no pain relief. Feeling pain with swallowing now. Continues to occasionally pass gas but feels like she cannot eat due to pain.   Current Meds:  Outpatient Encounter Medications as of 11/01/2017  Medication Sig  . acetaminophen (TYLENOL) 500 MG tablet Take 1,000 mg by mouth every 6 (six) hours as needed for moderate pain.  . hydrochlorothiazide (HYDRODIURIL) 25 MG tablet Take 1 tablet (25 mg total) by mouth daily.  Marland Kitchen ibuprofen (ADVIL,MOTRIN) 600 MG tablet Take 1 tablet (600 mg total) by mouth every 6 (six) hours as needed.  . megestrol (MEGACE) 40 MG tablet Take 1 tablet (40 mg total) by mouth 2 (two) times daily.  . [DISCONTINUED] misoprostol (CYTOTEC) 200 MCG tablet Place two tablets by mouth 6-8 hours prior to your next clinic appointment   No facility-administered encounter medications on file as of 11/01/2017.     Allergy:  Allergies  Allergen Reactions  . Codeine Nausea And Vomiting    Room spins    Social Hx:   Social History   Socioeconomic History  . Marital status: Married    Spouse name: Not on file  . Number of children: 0  . Years of education: Not on file  . Highest education level: Not on file  Occupational History  . Occupation:  unemployed  Social Needs  . Financial resource strain: Not on file  . Food insecurity:    Worry: Not on file    Inability: Not on file  . Transportation needs:    Medical: Not on file    Non-medical: Not on file  Tobacco Use  . Smoking status: Former Smoker    Years: 4.00    Types: Cigarettes    Last attempt to quit: 10/18/1978    Years since quitting: 39.1  . Smokeless tobacco: Never Used  . Tobacco comment: smoked cigarrettes when she was 40 to age 68 then quit.   Substance and Sexual Activity  . Alcohol use: Yes    Comment: rarely  . Drug use: No  . Sexual activity: Not on file  Lifestyle  . Physical activity:    Days per week: Not on file    Minutes per session: Not on file  . Stress: Not on file  Relationships  . Social connections:    Talks on phone: Not on file    Gets together: Not on file    Attends religious service: Not on file    Active member of club or organization: Not on file    Attends meetings of clubs or organizations: Not on  file    Relationship status: Not on file  . Intimate partner violence:    Fear of current or ex partner: Not on file    Emotionally abused: Not on file    Physically abused: Not on file    Forced sexual activity: Not on file  Other Topics Concern  . Not on file  Social History Narrative  . Not on file    Past Surgical Hx:  Past Surgical History:  Procedure Laterality Date  . ABDOMINAL HYSTERECTOMY N/A 11/24/2017   Procedure: TOTAL HYSTERECTOMY ABDOMINALGREATER 250 GRAMS;  Surgeon: Everitt Amber, MD;  Location: [facility];  Service: Gynecology;  Laterality: N/A;  . DEBULKING N/A 11/24/2017   Procedure: RADICAL TUMOR DEBULKING;  Surgeon: Everitt Amber, MD;  Location: [facility];  Service: Gynecology;  Laterality: N/A;  . LAPAROTOMY N/A 11/24/2017   Procedure: EXPLORATORY LAPAROTOMY;  Surgeon: Everitt Amber, MD;  Location: [facility];  Service: Gynecology;  Laterality: N/A;  . LYMPH NODE DISSECTION N/A 11/24/2017   Procedure: OMENTECTOMY;   Surgeon: Everitt Amber, MD;  Location: [facility];  Service: Gynecology;  Laterality: N/A;  . MYOMECTOMY    . SALPINGOOPHORECTOMY Bilateral 11/24/2017   Procedure: Merlene Laughter SALPINGO OOPHORECTOMY;  Surgeon: Everitt Amber, MD;  Location: [facility];  Service: Gynecology;  Laterality: Bilateral;    Past Medical Hx:  Past Medical History:  Diagnosis Date  . Cancer [facility])    endometrial cancer  . Fibroids   . GERD (gastroesophageal reflux disease)   . Hypertension   . Sinusitis     Past Gynecological History:  Myomectomy in her 90's for symptomatic uterine fibroids. Patient's last menstrual period was 04/20/2016.  Family Hx:  Family History  Problem Relation Age of Onset  . Diabetes Mother   . Prostate cancer Brother 42  . Colon cancer Paternal Grandmother   . Prostate cancer Brother 13    Review of Systems:  Constitutional  Feels well,  ENT Normal appearing ears and nares bilaterally Skin/Breast  No rash, sores, jaundice, itching, dryness Cardiovascular  No chest pain, shortness of breath, or edema  Pulmonary  No cough or wheeze.  Gastro Intestinal  + constipation, minimal BM's. No nausea or emesis Genito Urinary  No frequency, urgency, dysuria, no bleeding.  Musculo Skeletal  No myalgia, arthralgia, joint swelling or pain  Neurologic  No weakness, numbness, change in gait,  Psychology  No depression, anxiety, insomnia.   Labs:  CBC    Component Value Date/Time   WBC 10.9 (H) 12/13/2017 0809   RBC 3.87 12/13/2017 0809   HGB 8.9 (L) 12/13/2017 0809   HCT 28.4 (L) 12/13/2017 0809   PLT 715 (H) 12/13/2017 0809   MCV 73.4 (L) 12/13/2017 0809   MCH 23.0 (L) 12/13/2017 0809   MCHC 31.3 12/13/2017 0809   RDW 19.0 (H) 12/13/2017 0809   LYMPHSABS 1.2 12/11/2017 1443   MONOABS 1.0 (H) 12/18/2017 1443   EOSABS 0.4 12/28/2017 1443   BASOSABS 0.1 12/06/2017 1443   BMET    Component Value Date/Time   NA 136 12/13/2017 0809   NA 140 10/27/2017 1423   K 3.7 12/13/2017 0809    CL 104 12/13/2017 0809   CO2 21 (L) 12/13/2017 0809   GLUCOSE 103 (H) 12/13/2017 0809   BUN 19 12/13/2017 0809   BUN 14 10/27/2017 1423   CREATININE 1.31 (H) 12/13/2017 0809   CALCIUM 9.0 12/13/2017 0809   GFRNONAA 43 (L) 12/13/2017 0809   GFRAA 50 (L) 12/13/2017 0809     Vitals:  Blood pressure (!) 148/89, pulse (!) 108, temperature 99 F (37.2 C), temperature source Oral, resp. rate 17, height 5\' 4"  (1.626 m), weight 242 lb 15.2 oz (110.2 kg), last menstrual period 04/20/2016, SpO2 93 %.  Physical Exam: WD in NAD, in distress with pain Neck  Supple NROM, without any enlargements.  Lymph Node Survey No cervical supraclavicular or inguinal adenopathy Cardiovascular  Pulse normal rate, regularity and rhythm. S1 and S2 normal.  Lungs  Clear to auscultation bilateraly, without wheezes/crackles/rhonchi. Good air movement.  Skin  No rash/lesions/breakdown  Psychiatry  Alert and oriented to person, place, and time  Abdomen  Normoactive bowel sounds, abdomen soft, obese, distended but not tympanitic. No rebound or guarding. Incision in tact and free of erythema. 2cm of inferior wound separation. No drainage. Stable abdominal exam findings. Back No CVA tenderness Genito Urinary  deferred Rectal  deferred Extremities  No bilateral cyanosis, clubbing or edema.   Thereasa Solo, MD  12/13/2017, 7:03 PM

## 2017-12-14 ENCOUNTER — Inpatient Hospital Stay (HOSPITAL_COMMUNITY): Payer: BLUE CROSS/BLUE SHIELD

## 2017-12-14 LAB — CBC
HEMATOCRIT: 31.2 % — AB (ref 36.0–46.0)
HEMOGLOBIN: 9.6 g/dL — AB (ref 12.0–15.0)
MCH: 22.7 pg — ABNORMAL LOW (ref 26.0–34.0)
MCHC: 30.8 g/dL (ref 30.0–36.0)
MCV: 73.9 fL — ABNORMAL LOW (ref 78.0–100.0)
Platelets: 668 10*3/uL — ABNORMAL HIGH (ref 150–400)
RBC: 4.22 MIL/uL (ref 3.87–5.11)
RDW: 19.1 % — ABNORMAL HIGH (ref 11.5–15.5)
WBC: 11.2 10*3/uL — AB (ref 4.0–10.5)

## 2017-12-14 LAB — BASIC METABOLIC PANEL
ANION GAP: 13 (ref 5–15)
BUN: 19 mg/dL (ref 6–20)
CHLORIDE: 103 mmol/L (ref 101–111)
CO2: 20 mmol/L — AB (ref 22–32)
Calcium: 8.8 mg/dL — ABNORMAL LOW (ref 8.9–10.3)
Creatinine, Ser: 1.09 mg/dL — ABNORMAL HIGH (ref 0.44–1.00)
GFR calc non Af Amer: 54 mL/min — ABNORMAL LOW (ref >60)
Glucose, Bld: 149 mg/dL — ABNORMAL HIGH (ref 65–99)
Potassium: 4.2 mmol/L (ref 3.5–5.1)
Sodium: 136 mmol/L (ref 135–145)

## 2017-12-14 LAB — LIPASE, BLOOD: Lipase: 16 U/L (ref 11–51)

## 2017-12-14 NOTE — Progress Notes (Signed)
Inpatient Progress Note: Gyn-Onc  CC:  Unexplained postop abdominal pain in the setting of stage IV uterine cancer.  Assessment/Plan:  Robyn. Robyn Guerra  is a 62 y.o.  year old with stage IV carcinosarcoma of the uterus s/p ex lap, TAH, BSO, omentectomy, radical tumor debulking on 11/24/17 now readmitted with postop AKI, and pain.   AKI: baseline creatinine elevated at 1.4 . Has now returned to baseline and is stable. Continue aggressive oral hydration.  Recommend continue to hold nephrotoxic agents such as lovenox and ibuprofen.  Anemia: Hb stable. No signs of active bleeding. Will discharge home on ferrous gluconate.  Ascites: secondary to stage IV uterine carcinosarcoma. S/p paracentesis with limited pain relief.  Abdominal pain: oral meds: oxycontin 15mg , percocet and IV dilaudid prn for pain. This has achieved limited improvement in pain control. CT abd/pelvis on 12/12/17 to evaluate for occult source of intra-abdominal pathology causing pain did not show apparent source (other than ascites). However, pain unrelieved with paracentesis.  Clinically no sign of infection (eg abscess or perforation)  and normal labs and vital signs therefore my suspicion for postop pathologic intraabdominal process is low. Ascites tap with no growth and negative gram stain. Lipase normal - no pancreatitis. Plain film today did not show significant obstruction.  I feel that them most likely explanation for her pain is her progressive cancer. I am recommending proceeding with chemotherapy at Dr Calton Dach discretion.   HPI: Robyn Guerra is a 62 year old nulliparous woman who is seen in consultation at the request of Dr Ihor Dow for high grade endometrial cancer, fibroid uterus.  The patient reports vaginal bleeding for since December, 2017. It is daily but light. She was evaluated with a TVUS on 10/03/16 which showed a uterus with measurements: 14.4 x 5.8 x 9.7 cm. There appears to be a 3.8 x 3.2 x 3.0 cm  right posterior submucosal fibroid abutting the endometrial canal. A slightly larger 4.8 x 4.1 x 3.0 cm left posterior subserosal fibroid is also seen. The uterus is retroverted in nature. The endometrium was thickened to 1.8 cm. Vague debris and trace fluid are seen within the lower uterine segment and cervix.    She continued to have vaginal bleeding and a repeat US was performed on 08/19/18 which showed uterine measurements: 14.5 x 10.4 x 10.9 cm. Multiple uterine fibroids are seen. The largest fibroid measuring 4.7 cm, as well as a second fibroid measuring 2.8 cm, are located centrally consistent with submucosal fibroids. The posterior subserosal fibroid is also seen measuring 4.4 cm . These show no significant change compared to prior study. Endometrium thickness could not be visualized due to acoustic shadowing from fibroids described above.  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   CT abdo/pelvis on 10/31/17 showed a few small less than 1 cm retroperitoneal lymph nodes are seen in the left paraaortic and aortocaval spaces. 10 mm right lower quadrant mesenteric lymph node is seen. No abdominal aortic aneurysm. Aortic atherosclerosis.  Enlarged uterus measuring 19.5 cm in length. Multiple small uterine fibroids are seen, largest measuring approximately 5cm. Diffuse endometrial thickening is seen measuring 24 mm,consistent with history of known endometrial carcinosarcoma. Poorly defined heterogeneously enhancing mass is seen in the right adnexa abutting the uterus, which measures 6.1 x 5.5 cm. This is suspicious for extra uterine extension  of malignancy. No evidence of ascites.  On 11/24/17 she underwent ex lap TAH, BSO, omentectomy and radical tumor debulking.  She did well postoperatively however  then subsequently developed severe constipation and bloody stools from her bleeding hemorrhoids.  She held the Lovenox for approximately 2 days on March 30 and April 1 however then resumed it.  She P try to avoid Percocet although she does have abdominal pain from her constipation.  She had one episode of emesis on March 28 but none since that time.  She is states she is able to keep down oral agents food and liquids.  She is passing flatus.  She denies fevers.  A small amount of her wound has opened at the inferior aspect.  Labs were checked on 12/14/2017 which revealed a mildly elevated white count 13,000, anemia with a hemoglobin of 10.5, thrombocytosis at 718.  Her creatinine was substantially elevated to 2.6.  Of note her preoperative and postoperative creatinine had been stable at 1.4.  Interval Hx:  CT 12/12/08 showed progression of disease with ascties and liver mets (new) but no obstruction, no free air, no abscesses, no bleeding. Ascites drained 12/13/17 (3 L) with minimal to no pain relief. Gram stain negative.  Pain meds increased on 12/14/17 with limited relief in upper abdominal pain. Lipase this morning normal (no evidence of pancreatitis). Reports some emesis this morning with attempts at eating. Very poor appetite.    Current Meds:  Outpatient Encounter Medications as of 11/01/2017  Medication Sig  . acetaminophen (TYLENOL) 500 MG tablet Take 1,000 mg by mouth every 6 (six) hours as needed for moderate pain.  . hydrochlorothiazide (HYDRODIURIL) 25 MG tablet Take 1 tablet (25 mg total) by mouth daily.  Marland Kitchen ibuprofen (ADVIL,MOTRIN) 600 MG tablet Take 1 tablet (600 mg total) by mouth every 6 (six) hours as needed.  . megestrol (MEGACE) 40 MG tablet Take 1 tablet (40 mg total) by mouth 2 (two) times daily.  . [DISCONTINUED] misoprostol (CYTOTEC) 200 MCG tablet Place two tablets by mouth 6-8 hours prior to your next clinic appointment   No facility-administered encounter medications on file  as of 11/01/2017.     Allergy:  Allergies  Allergen Reactions  . Codeine Nausea And Vomiting    Room spins    Social Hx:   Social History   Socioeconomic History  . Marital status: Married    Spouse name: Not on file  . Number of children: 0  . Years of education: Not on file  . Highest education level: Not on file  Occupational History  . Occupation: unemployed  Social Needs  . Financial resource strain: Not on file  . Food insecurity:    Worry: Not on file    Inability: Not on file  . Transportation needs:    Medical: Not on file    Non-medical: Not on file  Tobacco Use  . Smoking status: Former Smoker    Years: 4.00    Types: Cigarettes    Last attempt to quit: 10/18/1978    Years since quitting: 39.1  . Smokeless tobacco: Never Used  . Tobacco comment: smoked cigarrettes when she was 4 to age 80 then quit.   Substance and Sexual Activity  . Alcohol use: Yes    Comment: rarely  . Drug use: No  . Sexual activity: Not on file  Lifestyle  . Physical activity:    Days per week: Not on file    Minutes per session: Not on file  .  Stress: Not on file  Relationships  . Social connections:    Talks on phone: Not on file    Gets together: Not on file    Attends religious service: Not on file    Active member of club or organization: Not on file    Attends meetings of clubs or organizations: Not on file    Relationship status: Not on file  . Intimate partner violence:    Fear of current or ex partner: Not on file    Emotionally abused: Not on file    Physically abused: Not on file    Forced sexual activity: Not on file  Other Topics Concern  . Not on file  Social History Narrative  . Not on file    Past Surgical Hx:  Past Surgical History:  Procedure Laterality Date  . ABDOMINAL HYSTERECTOMY N/A 11/24/2017   Procedure: TOTAL HYSTERECTOMY ABDOMINALGREATER 250 GRAMS;  Surgeon: Everitt Amber, MD;  Location: [facility];  Service: Gynecology;  Laterality: N/A;  .  DEBULKING N/A 11/24/2017   Procedure: RADICAL TUMOR DEBULKING;  Surgeon: Everitt Amber, MD;  Location: [facility];  Service: Gynecology;  Laterality: N/A;  . LAPAROTOMY N/A 11/24/2017   Procedure: EXPLORATORY LAPAROTOMY;  Surgeon: Everitt Amber, MD;  Location: [facility];  Service: Gynecology;  Laterality: N/A;  . LYMPH NODE DISSECTION N/A 11/24/2017   Procedure: OMENTECTOMY;  Surgeon: Everitt Amber, MD;  Location: [facility];  Service: Gynecology;  Laterality: N/A;  . MYOMECTOMY    . SALPINGOOPHORECTOMY Bilateral 11/24/2017   Procedure: Merlene Laughter SALPINGO OOPHORECTOMY;  Surgeon: Everitt Amber, MD;  Location: [facility];  Service: Gynecology;  Laterality: Bilateral;    Past Medical Hx:  Past Medical History:  Diagnosis Date  . Cancer [facility])    endometrial cancer  . Fibroids   . GERD (gastroesophageal reflux disease)   . Hypertension   . Sinusitis     Past Gynecological History:  Myomectomy in her 59's for symptomatic uterine fibroids. Patient's last menstrual period was 04/20/2016.  Family Hx:  Family History  Problem Relation Age of Onset  . Diabetes Mother   . Prostate cancer Brother 66  . Colon cancer Paternal Grandmother   . Prostate cancer Brother 43    Review of Systems:  Constitutional  Feels well,  ENT Normal appearing ears and nares bilaterally Skin/Breast  No rash, sores, jaundice, itching, dryness Cardiovascular  No chest pain, shortness of breath, or edema  Pulmonary  No cough or wheeze.  Gastro Intestinal  no constipation, + BM's. + nausea or emesis Genito Urinary  No frequency, urgency, dysuria, no bleeding.  Musculo Skeletal  No myalgia, arthralgia, joint swelling or pain  Neurologic  No weakness, numbness, change in gait,  Psychology  No depression, anxiety, insomnia.   Labs:  CBC    Component Value Date/Time   WBC 11.2 (H) 12/14/2017 0411   RBC 4.22 12/14/2017 0411   HGB 9.6 (L) 12/14/2017 0411   HCT 31.2 (L) 12/14/2017 0411   PLT 668 (H) 12/14/2017 0411   MCV  73.9 (L) 12/14/2017 0411   MCH 22.7 (L) 12/14/2017 0411   MCHC 30.8 12/14/2017 0411   RDW 19.1 (H) 12/14/2017 0411   LYMPHSABS 1.2 12/12/2017 1443   MONOABS 1.0 (H) 12/06/2017 1443   EOSABS 0.4 12/24/2017 1443   BASOSABS 0.1 12/12/2017 1443   BMET    Component Value Date/Time   NA 136 12/14/2017 0411   NA 140 10/27/2017 1423   K 4.2 12/14/2017 0411   CL 103 12/14/2017  0411   CO2 20 (L) 12/14/2017 0411   GLUCOSE 149 (H) 12/14/2017 0411   BUN 19 12/14/2017 0411   BUN 14 10/27/2017 1423   CREATININE 1.09 (H) 12/14/2017 0411   CALCIUM 8.8 (L) 12/14/2017 0411   GFRNONAA 54 (L) 12/14/2017 0411   GFRAA >60 12/14/2017 0411   Plain Film 2 views abdominal series 12/14/17:  Interim development of gaseous enlargement of central small bowel loops but with colon gas present and enteral contrast in the right colon findings could be secondary to an ileus with developing or partial obstruction felt to be less likely.  Vitals:  Blood pressure 131/79, pulse 99, temperature 98.7 F (37.1 C), temperature source Oral, resp. rate 17, height 5\' 4"  (1.626 m), weight 242 lb 15.2 oz (110.2 kg), last menstrual period 04/20/2016, SpO2 96 %.  Physical Exam: WD in NAD, in distress with pain Neck  Supple NROM, without any enlargements.  Lymph Node Survey No cervical supraclavicular or inguinal adenopathy Cardiovascular  Pulse normal rate, regularity and rhythm. S1 and S2 normal.  Lungs  Clear to auscultation bilateraly, without wheezes/crackles/rhonchi. Good air movement.  Skin  No rash/lesions/breakdown  Psychiatry  Alert and oriented to person, place, and time  Abdomen  Normoactive bowel sounds, abdomen soft, obese, distended but not tympanitic. No rebound or guarding. Incision in tact and free of erythema. 1cm of inferior wound separation. No drainage. No cellulitis. Stable abdominal exam findings. Back No CVA tenderness Genito Urinary  deferred Rectal  deferred Extremities  No bilateral  cyanosis, clubbing or edema.   Thereasa Solo, MD  12/14/2017, 6:48 PM

## 2017-12-15 ENCOUNTER — Inpatient Hospital Stay (HOSPITAL_COMMUNITY): Payer: BLUE CROSS/BLUE SHIELD

## 2017-12-15 ENCOUNTER — Other Ambulatory Visit: Payer: Self-pay

## 2017-12-15 ENCOUNTER — Telehealth: Payer: Self-pay

## 2017-12-15 DIAGNOSIS — R05 Cough: Secondary | ICD-10-CM

## 2017-12-15 DIAGNOSIS — R110 Nausea: Secondary | ICD-10-CM

## 2017-12-15 DIAGNOSIS — D509 Iron deficiency anemia, unspecified: Secondary | ICD-10-CM

## 2017-12-15 LAB — BASIC METABOLIC PANEL
Anion gap: 10 (ref 5–15)
BUN: 19 mg/dL (ref 6–20)
CALCIUM: 8.9 mg/dL (ref 8.9–10.3)
CO2: 24 mmol/L (ref 22–32)
CREATININE: 1.28 mg/dL — AB (ref 0.44–1.00)
Chloride: 106 mmol/L (ref 101–111)
GFR calc Af Amer: 52 mL/min — ABNORMAL LOW (ref >60)
GFR calc non Af Amer: 45 mL/min — ABNORMAL LOW (ref >60)
GLUCOSE: 159 mg/dL — AB (ref 65–99)
Potassium: 4.1 mmol/L (ref 3.5–5.1)
Sodium: 140 mmol/L (ref 135–145)

## 2017-12-15 LAB — TROPONIN I: Troponin I: 0.21 ng/mL (ref <0.03)

## 2017-12-15 LAB — CBC
HCT: 30.6 % — ABNORMAL LOW (ref 36.0–46.0)
HEMOGLOBIN: 9.4 g/dL — AB (ref 12.0–15.0)
MCH: 22.8 pg — AB (ref 26.0–34.0)
MCHC: 30.7 g/dL (ref 30.0–36.0)
MCV: 74.3 fL — AB (ref 78.0–100.0)
PLATELETS: 714 10*3/uL — AB (ref 150–400)
RBC: 4.12 MIL/uL (ref 3.87–5.11)
RDW: 19.1 % — ABNORMAL HIGH (ref 11.5–15.5)
WBC: 12.6 10*3/uL — ABNORMAL HIGH (ref 4.0–10.5)

## 2017-12-15 LAB — MRSA PCR SCREENING: MRSA BY PCR: NEGATIVE

## 2017-12-15 LAB — TSH: TSH: 0.474 u[IU]/mL (ref 0.350–4.500)

## 2017-12-15 LAB — LACTIC ACID, PLASMA: LACTIC ACID, VENOUS: 1.2 mmol/L (ref 0.5–1.9)

## 2017-12-15 MED ORDER — LIDOCAINE HCL 1 % IJ SOLN
INTRAMUSCULAR | Status: DC | PRN
  Administered 2017-12-15: 10 mL

## 2017-12-15 MED ORDER — IOPAMIDOL (ISOVUE-300) INJECTION 61%
INTRAVENOUS | Status: AC
  Filled 2017-12-15: qty 30

## 2017-12-15 MED ORDER — LACTATED RINGERS IV BOLUS
1000.0000 mL | Freq: Once | INTRAVENOUS | Status: AC
  Administered 2017-12-15: 1000 mL via INTRAVENOUS

## 2017-12-15 MED ORDER — DILTIAZEM HCL 100 MG IV SOLR
5.0000 mg/h | INTRAVENOUS | Status: DC
  Administered 2017-12-15: 5 mg/h via INTRAVENOUS
  Filled 2017-12-15 (×4): qty 100

## 2017-12-15 MED ORDER — METOPROLOL TARTRATE 5 MG/5ML IV SOLN
INTRAVENOUS | Status: AC
  Filled 2017-12-15: qty 5

## 2017-12-15 MED ORDER — DILTIAZEM LOAD VIA INFUSION
10.0000 mg | Freq: Once | INTRAVENOUS | Status: DC
  Administered 2017-12-15: 10 mg via INTRAVENOUS
  Filled 2017-12-15: qty 10

## 2017-12-15 MED ORDER — AMIODARONE LOAD VIA INFUSION
150.0000 mg | Freq: Once | INTRAVENOUS | Status: AC
  Administered 2017-12-15: 150 mg via INTRAVENOUS
  Filled 2017-12-15: qty 83.34

## 2017-12-15 MED ORDER — METOPROLOL TARTRATE 5 MG/5ML IV SOLN
5.0000 mg | Freq: Once | INTRAVENOUS | Status: AC
  Administered 2017-12-15: 5 mg via INTRAVENOUS
  Filled 2017-12-15: qty 5

## 2017-12-15 MED ORDER — LORAZEPAM 2 MG/ML IJ SOLN
0.5000 mg | Freq: Four times a day (QID) | INTRAMUSCULAR | Status: DC | PRN
  Administered 2017-12-16 – 2017-12-24 (×9): 0.5 mg via INTRAVENOUS
  Filled 2017-12-15 (×9): qty 1

## 2017-12-15 MED ORDER — ENOXAPARIN SODIUM 40 MG/0.4ML ~~LOC~~ SOLN: 40.0000 mg | SUBCUTANEOUS | Status: DC

## 2017-12-15 MED ORDER — HEPARIN (PORCINE) IN NACL 100-0.45 UNIT/ML-% IJ SOLN
1650.0000 [IU]/h | INTRAMUSCULAR | Status: DC
  Administered 2017-12-15: 1400 [IU]/h via INTRAVENOUS
  Filled 2017-12-15 (×2): qty 250

## 2017-12-15 MED ORDER — AMIODARONE HCL IN DEXTROSE 360-4.14 MG/200ML-% IV SOLN
30.0000 mg/h | INTRAVENOUS | Status: DC
Start: 2017-12-16 — End: 2017-12-21
  Administered 2017-12-16 – 2017-12-20 (×9): 30 mg/h via INTRAVENOUS
  Filled 2017-12-15 (×10): qty 200

## 2017-12-15 MED ORDER — AMIODARONE HCL IN DEXTROSE 360-4.14 MG/200ML-% IV SOLN
60.0000 mg/h | INTRAVENOUS | Status: AC
  Administered 2017-12-15 – 2017-12-16 (×2): 60 mg/h via INTRAVENOUS
  Filled 2017-12-15: qty 200

## 2017-12-15 MED ORDER — SODIUM CHLORIDE 0.9 % IV SOLN
3.0000 g | Freq: Four times a day (QID) | INTRAVENOUS | Status: AC
  Administered 2017-12-15 – 2017-12-22 (×28): 3 g via INTRAVENOUS
  Filled 2017-12-15 (×29): qty 3

## 2017-12-15 MED ORDER — METOPROLOL TARTRATE 5 MG/5ML IV SOLN
5.0000 mg | Freq: Once | INTRAVENOUS | Status: AC
  Administered 2017-12-15: 5 mg via INTRAVENOUS

## 2017-12-15 MED ORDER — IOPAMIDOL (ISOVUE-300) INJECTION 61%
75.0000 mL | Freq: Once | INTRAVENOUS | Status: AC | PRN
  Administered 2017-12-15: 75 mL via INTRAVENOUS

## 2017-12-15 MED ORDER — IOPAMIDOL (ISOVUE-300) INJECTION 61%: 15.0000 mL | Freq: Two times a day (BID) | INTRAVENOUS | Status: DC | PRN

## 2017-12-15 NOTE — Progress Notes (Signed)
Inpatient Progress Note: Gyn-Onc  CC:  Unexplained postop abdominal pain in the setting of stage IV uterine cancer.  Assessment/Plan:  Robyn. Robyn Guerra  is a 62 y.o.  year old with stage IV carcinosarcoma of the uterus s/p ex lap, TAH, BSO, omentectomy, radical tumor debulking on 11/24/17 now readmitted with postop AKI, and pain.   AKI: baseline creatinine elevated at 1.4 . Has now returned to baseline and is hovering close to this baseline. Continue aggressive oral hydration.  We are currently tyring to hold nephrotoxic agents such as lovenox and ibuprofen.  Anemia: Hb stable. No signs of active bleeding. Defer to Dr. Alvy Bimler.  Ascites: secondary to progressive stage IV uterine carcinosarcoma. S/p paracentesis with limited pain relief.  Abdominal pain: oral meds: oxycontin 15mg , percocet and IV dilaudid prn for pain. This has achieved limited improvement in pain control. CT abd/pelvis on 12/12/17 to evaluate for occult source of intra-abdominal pathology causing pain did not show apparent source (other than ascites).  Clinically no sign of infection (eg abscess or perforation)  and normal labs and vital signs therefore the suspicion for postop pathologic intraabdominal process is low. Ascites tap with no growth and negative gram stain. Lipase normal - no pancreatitis. Plain film 4/10 showed no significant obstruction.  It is felt the most likely explanation for her pain is her progressive cancer. Dr.Gorsuch has seen the patient and there is a plan to start palliative chemotherapy next week.   HPI: Robyn Guerra is a 62 year old nulliparous woman who is seen in consultation at the request of Dr Ihor Dow for high grade endometrial cancer, fibroid uterus.  The patient reports vaginal bleeding for since December, 2017. It is daily but light. She was evaluated with a TVUS on 10/03/16 which showed a uterus with measurements: 14.4 x 5.8 x 9.7 cm. There appears to be a 3.8 x 3.2 x 3.0 cm right  posterior submucosal fibroid abutting the endometrial canal. A slightly larger 4.8 x 4.1 x 3.0 cm left posterior subserosal fibroid is also seen. The uterus is retroverted in nature. The endometrium was thickened to 1.8 cm. Vague debris and trace fluid are seen within the lower uterine segment and cervix.    She continued to have vaginal bleeding and a repeat US was performed on 08/19/18 which showed uterine measurements: 14.5 x 10.4 x 10.9 cm. Multiple uterine fibroids are seen. The largest fibroid measuring 4.7 cm, as well as a second fibroid measuring 2.8 cm, are located centrally consistent with submucosal fibroids. The posterior subserosal fibroid is also seen measuring 4.4 cm . These show no significant change compared to prior study. Endometrium thickness could not be visualized due to acoustic shadowing from fibroids described above.  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   CT abdo/pelvis on 10/31/17 showed a few small less than 1 cm retroperitoneal lymph nodes are seen in the left paraaortic and aortocaval spaces. 10 mm right lower quadrant mesenteric lymph node is seen. No abdominal aortic aneurysm. Aortic atherosclerosis.  Enlarged uterus measuring 19.5 cm in length. Multiple small uterine fibroids are seen, largest measuring approximately 5cm. Diffuse endometrial thickening is seen measuring 24 mm,consistent with history of known endometrial carcinosarcoma. Poorly defined heterogeneously enhancing mass is seen in the right adnexa abutting the uterus, which measures 6.1 x 5.5 cm; suspicious for  extra uterine extension of malignancy. No evidence of ascites.  On 11/24/17 she underwent ex lap TAH, BSO, omentectomy and radical tumor debulking.  She did well postoperatively however then  subsequently developed severe constipation and bloody stools from her bleeding hemorrhoids.  She held the Lovenox for approximately 2 days on March 30 and April 1 however then resumed it.  She try to avoid Percocet although she does have abdominal pain from her constipation.  She had one episode of emesis on March 28 but none since that time.  She is states she is able to keep down oral agents food and liquids.  She is passing flatus.  She denies fevers.  A small amount of her wound has opened at the inferior aspect.  Labs were checked on 12/13/2017 which revealed a mildly elevated white count 13,000, anemia with a hemoglobin of 10.5, thrombocytosis at 718.  Her creatinine was substantially elevated to 2.6.  Of note her preoperative and postoperative creatinine had been stable at 1.4.  Interval Hx:  CT 12/12/08 showed progression of disease with ascties and liver mets (new) but no obstruction, no free air, no abscesses, no bleeding. Ascites drained 12/13/17 (3 L) with minimal to no pain relief. Gram stain negative.  Pain meds increased on 12/14/17 with limited relief in upper abdominal pain. Lipase normal (no evidence of pancreatitis). 12/14/17 - reported emesis (however not preceeded with nausea). Very poor appetite. Plain films showed no obstruction 12/15/17 - No additional emesis. Not passing flatus. Very little oral intake. Pain is moderately controlled.    Current Meds:   Current Facility-Administered Medications:  .  acetaminophen (TYLENOL) tablet 650 mg, 650 mg, Oral, Q6H PRN, 650 mg at 12/15/17 0056 **OR** acetaminophen (TYLENOL) suppository 650 mg, 650 mg, Rectal, Q6H PRN, Cross, Melissa D, NP .  dextrose 5 %-0.9 % sodium chloride infusion, , Intravenous, Continuous, Everitt Amber, MD, Last Rate: 100 mL/hr at 12/14/17 1700 .  famotidine (PEPCID) tablet 20 mg, 20 mg, Oral, BID, Everitt Amber, MD, 20 mg at 12/15/17 1042 .  feeding supplement (ENSURE ENLIVE) (ENSURE ENLIVE) liquid 237 mL, 237 mL, Oral,  BID BM, Everitt Amber, MD, 237 mL at 12/13/17 1524 .  HYDROmorphone (DILAUDID) injection 1 mg, 1 mg, Intravenous, Q2H PRN, Everitt Amber, MD, 1 mg at 12/15/17 0210 .  iohexol (OMNIPAQUE) 300 MG/ML solution 25 mL, 25 mL, Oral, Once PRN, Everitt Amber, MD .  magnesium hydroxide (MILK OF MAGNESIA) suspension 30 mL, 30 mL, Oral, Daily, Everitt Amber, MD, 30 mL at 12/15/17 1042 .  ondansetron (ZOFRAN) tablet 4 mg, 4 mg, Oral, Q6H PRN, 4 mg at 12/10/17 0116 **OR** ondansetron (ZOFRAN) injection 4 mg, 4 mg, Intravenous, Q6H PRN, Cross, Melissa D, NP, 4 mg at 12/14/17 1712 .  oxyCODONE (Oxy IR/ROXICODONE) immediate release tablet 5 mg, 5 mg, Oral, Q4H PRN, Cross, Melissa D, NP, 5 mg at 12/15/17 1042 .  oxyCODONE (OXYCONTIN) 12 hr tablet 10 mg, 10 mg, Oral, Q12H, Everitt Amber, MD, 10 mg at 12/15/17 1042 .  polyethylene glycol (MIRALAX / GLYCOLAX) packet 17 g, 17 g, Oral, BID, Everitt Amber, MD, 17 g at 12/15/17 1042 .  senna-docusate (Senokot-S) tablet 1 tablet, 1 tablet, Oral, QHS, Cross, Melissa D, NP, 1 tablet at 12/14/17 2108 .  simethicone (MYLICON) chewable tablet 80 mg, 80 mg, Oral, QID PRN, Everitt Amber, MD, 80 mg at 12/15/17 0056 .  sodium phosphate (FLEET) 7-19 GM/118ML enema 1 enema, 1 enema, Rectal, BID PRN, Everitt Amber, MD, 1 enema at 12/11/17 1733 .  zolpidem (AMBIEN) tablet 5 mg, 5 mg, Oral, QHS PRN, Everitt Amber, MD, 5 mg at 12/15/17 0056  No facility-administered encounter medications on file as of 11/01/2017.     Allergy:  Allergies  Allergen Reactions  . Codeine Nausea And Vomiting    Room spins    Social Hx:   Social History   Socioeconomic History  . Marital status: Married    Spouse name: Not on file  . Number of children: 0  . Years of education: Not on file  . Highest education level: Not on file  Occupational History  . Occupation: unemployed  Social Needs  . Financial resource strain: Not on file  . Food insecurity:    Worry: Not on file    Inability: Not on file  .  Transportation needs:    Medical: Not on file    Non-medical: Not on file  Tobacco Use  . Smoking status: Former Smoker    Years: 4.00    Types: Cigarettes    Last attempt to quit: 10/18/1978    Years since quitting: 39.1  . Smokeless tobacco: Never Used  . Tobacco comment: smoked cigarrettes when she was 89 to age 51 then quit.   Substance and Sexual Activity  . Alcohol use: Yes    Comment: rarely  . Drug use: No  . Sexual activity: Not on file  Lifestyle  . Physical activity:    Days per week: Not on file    Minutes per session: Not on file  . Stress: Not on file  Relationships  . Social connections:    Talks on phone: Not on file    Gets together: Not on file    Attends religious service: Not on file    Active member of club or organization: Not on file    Attends meetings of clubs or organizations: Not on file    Relationship status: Not on file  . Intimate partner violence:    Fear of current or ex partner: Not on file    Emotionally abused: Not on file    Physically abused: Not on file    Forced sexual activity: Not on file  Other Topics Concern  . Not on file  Social History Narrative  . Not on file    Past Surgical Hx:  Past Surgical History:  Procedure Laterality Date  . ABDOMINAL HYSTERECTOMY N/A 11/24/2017   Procedure: TOTAL HYSTERECTOMY ABDOMINALGREATER 250 GRAMS;  Surgeon: Everitt Amber, MD;  Location: [facility];  Service: Gynecology;  Laterality: N/A;  . DEBULKING N/A 11/24/2017   Procedure: RADICAL TUMOR DEBULKING;  Surgeon: Everitt Amber, MD;  Location: [facility];  Service: Gynecology;  Laterality: N/A;  . LAPAROTOMY N/A 11/24/2017   Procedure: EXPLORATORY LAPAROTOMY;  Surgeon: Everitt Amber, MD;  Location: [facility];  Service: Gynecology;  Laterality: N/A;  . LYMPH NODE DISSECTION N/A 11/24/2017   Procedure: OMENTECTOMY;  Surgeon: Everitt Amber, MD;  Location: [facility];  Service: Gynecology;  Laterality: N/A;  . MYOMECTOMY    . SALPINGOOPHORECTOMY Bilateral 11/24/2017    Procedure: Merlene Laughter SALPINGO OOPHORECTOMY;  Surgeon: Everitt Amber, MD;  Location: [facility];  Service: Gynecology;  Laterality: Bilateral;    Past Medical Hx:  Past Medical History:  Diagnosis Date  . Cancer [facility])    endometrial cancer  . Fibroids   . GERD (gastroesophageal reflux disease)   . Hypertension   . Sinusitis     Past Gynecological History:  Myomectomy in her 50's for symptomatic uterine fibroids. Patient's last menstrual  period was 04/20/2016.  Family Hx:  Family History  Problem Relation Age of Onset  . Diabetes Mother   . Prostate cancer Brother 54  . Colon cancer Paternal Grandmother   . Prostate cancer Brother 40    Labs:  CBC    Component Value Date/Time   WBC 12.6 (H) 12/15/2017 0413   RBC 4.12 12/15/2017 0413   HGB 9.4 (L) 12/15/2017 0413   HCT 30.6 (L) 12/15/2017 0413   PLT 714 (H) 12/15/2017 0413   MCV 74.3 (L) 12/15/2017 0413   MCH 22.8 (L) 12/15/2017 0413   MCHC 30.7 12/15/2017 0413   RDW 19.1 (H) 12/15/2017 0413   LYMPHSABS 1.2 12/20/2017 1443   MONOABS 1.0 (H) 12/17/2017 1443   EOSABS 0.4 12/30/2017 1443   BASOSABS 0.1 12/17/2017 1443   BMET    Component Value Date/Time   NA 140 12/15/2017 0413   NA 140 10/27/2017 1423   K 4.1 12/15/2017 0413   CL 106 12/15/2017 0413   CO2 24 12/15/2017 0413   GLUCOSE 159 (H) 12/15/2017 0413   BUN 19 12/15/2017 0413   BUN 14 10/27/2017 1423   CREATININE 1.28 (H) 12/15/2017 0413   CALCIUM 8.9 12/15/2017 0413   GFRNONAA 45 (L) 12/15/2017 0413   GFRAA 52 (L) 12/15/2017 0413   Plain Film 2 views abdominal series 12/14/17:  Interim development of gaseous enlargement of central small bowel loops but with colon gas present and enteral contrast in the right colon findings could be secondary to an ileus with developing or partial obstruction felt to be less likely.  Vitals:  Blood pressure 129/84, pulse (!) 106, temperature 98.4 F (36.9 C), temperature source Oral, resp. rate 20, height 5\' 4"  (1.626 m),  weight 242 lb 15.2 oz (110.2 kg), last menstrual period 04/20/2016, SpO2 95 %.  Physical Exam: WD in NAD, in mild distress with pain Neck  NROM Skin  No rash/lesions/breakdown  Psychiatry  Alert and oriented to person, place, and time  Abdomen  Soft, obese, going to IR for PICC.  Extremities  No bilateral cyanosis, clubbing or edema.   Isabel Caprice, MD  12/15/2017, 10:56 AM

## 2017-12-15 NOTE — Progress Notes (Signed)
MEDICATION-RELATED CONSULT NOTE   IR Procedure Consult - Anticoagulant/Antiplatelet PTA/Inpatient Med List Review by Pharmacist    Procedure: US/fluoro-guided PICC placement    Completed: 4/11 at 1221  Post-Procedural bleeding risk per IR MD assessment:  STANDARD  Antithrombotic medications on inpatient or PTA profile prior to procedure: SQ heparin; this was discontinued on 4/8     Recommended restart time per IR Post-Procedure Guidelines:  Per d/w Dr. Janyce Llanos this AM, plan is to resume LMWH as prophylaxis agent. Per protocol, for STANDARD risk procedures, start time is the following AM (D+1)   Other considerations:  none   Plan:   Enox 40 mg SQ starting tomorrow AM  Based on TBW, can consider increasing dose to 0.5 mg/kg in next 24-48 hr if CBC, SCr stable, and no bleeding    Reuel Boom, PharmD, BCPS [phone] 12/15/2017, 12:40 PM

## 2017-12-15 NOTE — Significant Event (Signed)
Rapid Response Event Note  Overview: Time Called: [place] Time: 5188 Event Type: Cardiac Notified by Jenny Reichmann bedside RN in regards to patient in 1330 having changes in pulse rate per dinamap. Upon arrival, patient appeared to have some shortness of breath but maintaining O2 Sat on nasal canula 3 Liters. Placed patient on rapid response dinamap, patient appeared to be in Afib RVR. Ordered for EKG to be completed.  Initial Focused Assessment: Neuro: Patient alert and oriented x 4, pupils 2+, reactive to light, and round bilaterally. Patient was able to move all extremities with ease when following basic commands. Cardiac: AFIB RVR, HR 140s-200s, S1 and S2 heard upon auscultation but irregular, 1 + pulses in radial and pedal locations bilaterally upon palpation, BP WNL- hypertension, see vital signs. Pulmonary: Breath sounds clear in upper lung fields, clear and diminished in lower lung fields bilaterally, RR 20-30s, regular respiratory pattern, slightly labored, on 3 LNC, O2 Sats 93-94%.   Interventions: EKG Conducted  MD Powel Notified -5mg  IV Lopressor ordered -Trx to SDU to start cardizem gtt  Plan of Care (if not transferred):      Milo Schreier C

## 2017-12-15 NOTE — Progress Notes (Signed)
  Amiodarone Drug - Drug Interaction Consult Note  Recommendations:  Amiodarone is metabolized by the cytochrome P450 system and therefore has the potential to cause many drug interactions. Amiodarone has an average plasma half-life of 50 days (range 20 to 100 days).   There is potential for drug interactions to occur several weeks or months after stopping treatment and the onset of drug interactions may be slow after initiating amiodarone.   []  Statins: Increased risk of myopathy. Simvastatin- restrict dose to 20mg  daily. Other statins: counsel patients to report any muscle pain or weakness immediately.  []  Anticoagulants: Amiodarone can increase anticoagulant effect. Consider warfarin dose reduction. Patients should be monitored closely and the dose of anticoagulant altered accordingly, remembering that amiodarone levels take several weeks to stabilize.  []  Antiepileptics: Amiodarone can increase plasma concentration of phenytoin, the dose should be reduced. Note that small changes in phenytoin dose can result in large changes in levels. Monitor patient and counsel on signs of toxicity.  []  Beta blockers: increased risk of bradycardia, AV block and myocardial depression. Sotalol - avoid concomitant use.  [x]   Calcium channel blockers (diltiazem and verapamil): increased risk of bradycardia, AV block and myocardial depression.  []   Cyclosporine: Amiodarone increases levels of cyclosporine. Reduced dose of cyclosporine is recommended.  []  Digoxin dose should be halved when amiodarone is started.  []  Diuretics: increased risk of cardiotoxicity if hypokalemia occurs.  []  Oral hypoglycemic agents (glyburide, glipizide, glimepiride): increased risk of hypoglycemia. Patient's glucose levels should be monitored closely when initiating amiodarone therapy.   [x]  Drugs that prolong the QT interval:  Torsades de pointes risk may be increased with concurrent use - avoid if possible.  Monitor QTc,  also keep magnesium/potassium WNL if concurrent therapy can't be avoided.              - PRN ondansetron  Thank You,   Doreene Eland, PharmD, BCPS.   Pager: [phone] 12/15/2017 10:00 PM

## 2017-12-15 NOTE — Progress Notes (Signed)
Came to see patient due to tachycardia and hypoxia. Lungs - decreased BS bilaterally. No wheezes, no rhonchi CV - tachy Abdo - decreased BS. Soft, NT, no rebound. Moderate distension. Ext. - no edema, NT.  Per radiology re-review of Chest CT done today shows RUL PE. Talked to medicine for plan to start heparin drip until we know surgical intervention not needed and given her creatinine I think that would be the better option for now.  Dr. Abel Presto help ordering heparin drip appreciated.

## 2017-12-15 NOTE — Procedures (Signed)
Ultrasound/fluoroscopic guided placement of right double lumen brachial vein PICC.  Length 44 cm.  Tip SVC/right atrial junction.  Medication used-1% lidocaine to skin and subcutaneous tissue.  Okay to use.

## 2017-12-15 NOTE — Progress Notes (Signed)
Rapid response called at 1800 due to patients heart rate dipping down to the 50's and irregular after it was 120's. Patient placed on monitor with RR RN. Dr Florene Glen notified and came to floor. EKG completed. Orders for transfer to Step down written and patient was transferred.

## 2017-12-15 NOTE — Progress Notes (Signed)
ANTICOAGULATION CONSULT NOTE - Initial Consult  Pharmacy Consult for heparin Indication: pulmonary embolus  Allergies  Allergen Reactions  . Codeine Nausea And Vomiting    Room spins    Patient Measurements: Height: 5\' 4"  (162.6 cm) Weight: 242 lb 15.2 oz (110.2 kg) IBW/kg (Calculated) : 54.7 Heparin Dosing Weight: 81kg  Vital Signs: Temp: 98.8 F (37.1 C) (04/11 1400) Temp Source: Oral (04/11 1400) BP: 152/83 (04/11 1400) Pulse Rate: 126 (04/11 1445)  Labs: Recent Labs    12/13/17 0809 12/14/17 0411 12/15/17 0413  HGB 8.9* 9.6* 9.4*  HCT 28.4* 31.2* 30.6*  PLT 715* 668* 714*  CREATININE 1.31* 1.09* 1.28*    Estimated Creatinine Clearance: 56.7 mL/min (A) (by C-G formula based on SCr of 1.28 mg/dL (H)).   Medical History: Past Medical History:  Diagnosis Date  . Cancer [facility])    endometrial cancer  . Fibroids   . GERD (gastroesophageal reflux disease)   . Hypertension   . Sinusitis     Assessment: 53 YOF with stage IV uterine cancer.  She underwent ex lap with TAH, BSO, omentectomy and debulking on 3/21.  CT chest re-reviewed and determined to have RUL PE.   Pharmacy asked to dose heparin gtt.    Today, 12/15/2017  Patient did have PICC placed by IR at ~12:21  CBC: Anemia noted (Hgb stable), pltc elevated  Renal: SCr mildly elevated  Goal of Therapy:  Heparin level 0.3-0.7 units/ml Monitor platelets by anticoagulation protocol: Yes   Plan:   D/C SQ enoxaparin  Start heparin infusion (without bolus d/t procedure earlier today) at 1400 units/hr  6h heparin level  Daily heparin level and CBC  Await plan for long-term anticoagulation (LMWH vs DOAC)  Monitor for bleeding  Doreene Eland, PharmD, BCPS.   Pager: [phone] 12/15/2017 4:22 PM

## 2017-12-15 NOTE — Progress Notes (Signed)
Pharmacy Antibiotic Note  Robyn Guerra is a 62 y.o. female with PMH Stage IV carcinosarcoma of the uterus s/p ex lap, TAH, BSO, omentectomy, radical tumor debulking on 11/24/17 who represented on 4/5 with post op AKI, constipation and pain. Admitted for IV fluids and Sx management, concern for disease progression. CT chest on 4/11 concerning for aspiration PNA. Pharmacy has been consulted for Unasyn dosing.  Today, 12/15/2017:  AKI nearly resolved  No fevers, but WBC trending back up  No relevant Cx drawn  Plan:  Unasyn 3g IV q6 hr  Pharmacy will sign off, following peripherally for renal adjustments   Height: 5\' 4"  (162.6 cm) Weight: 242 lb 15.2 oz (110.2 kg) IBW/kg (Calculated) : 54.7  Temp (24hrs), Avg:98.8 F (37.1 C), Min:98.4 F (36.9 C), Max:99.1 F (37.3 C)  Recent Labs  Lab 12/11/17 0421 12/12/17 0424 12/13/17 0809 12/14/17 0411 12/15/17 0413  WBC 11.4* 10.3 10.9* 11.2* 12.6*  CREATININE 1.37* 1.18* 1.31* 1.09* 1.28*    Estimated Creatinine Clearance: 56.7 mL/min (A) (by C-G formula based on SCr of 1.28 mg/dL (H)).    Allergies  Allergen Reactions  . Codeine Nausea And Vomiting    Room spins    Thank you for allowing pharmacy to be a part of this patient's care.  Reuel Boom, PharmD, BCPS [phone] 12/15/2017, 12:54 PM

## 2017-12-15 NOTE — Telephone Encounter (Signed)
Cindy RN called with CT chest results for Dr Alvy Bimler.  Results printed w/ nurse's number and given to Dr Alvy Bimler at this time.

## 2017-12-15 NOTE — Progress Notes (Signed)
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 The patient is symptomatic with severe abdominal pain, nausea, changes in bowel habits and anorexia I have a long discussion with the patient and since her sister Unfortunately, her cancer relapse very quickly since surgery and it is unlikely that she will get better without palliative chemotherapy She understood that she has stage IV disease and treatment is strictly palliative in nature She has symptoms of cough with productive sputum and with early cancer recurrence, I recommend CT scan of the chest to exclude metastasis to her lungs I recommend PICC line placement in anticipation for inpatient chemotherapy next week  Malignant ascites, nausea, changes in bowel habits, likely due to peritoneal carcinomatosis She have signs and symptoms of peritoneal carcinomatosis She has lost a lot of weight due to persistent nausea Continue aggressive supportive care with antiemetics and laxatives  Productive cough I recommend CT imaging to exclude pneumonia and metastasis to the lung  Cancer associated pain She has poorly controlled pain Intravenous pain medicine seems to work the best I recommend palliative care consult  for pain management  Recent acute renal failure secondary to poor oral intake Malignant cachexia Moderate protein calorie malnutrition She has responded to IV fluid resuscitation I recommend continue intermittent IV fluid support  Iron deficiency anemia Component of anemia chronic illness Reactive thrombocytosis Iron studies from last week confirmed component of iron deficiency anemia I recommend intravenous iron support tomorrow  Goals of care discussion We have extensive discussion about goals of care She is overwhelmed with the information provided today and the bad news The patient wants to be full code She understood that  treatment is strictly palliative in nature I recommend palliative care consult for goals of care discussion as well   Discharge planning It is unlikely that she will be ready to go home anytime soon Without palliative chemotherapy, she will not get better and will succumb to disease quickly With chemotherapy, there is a 50% chance that she will improve slowly over the next few weeks and eventually leave the hospital I recommend transfer of care to hospitalist service   I spent almost 50 minutes with the patient with direct face-to-face examination and counseling and at least 15 more minutes with coordination of care  Heath Lark, MD 12/15/2017  9:36 AM   Subjective:  I was requested by Dr. Denman George to discuss plan of care with the patient and family members Unfortunately, recent CT imaging show early signs of cancer recurrence with malignant ascites and metastatic cancer to the liver She underwent ultrasound paracentesis for diagnostic and therapeutic relief Fluid cytology is positive for malignancy The patient continues to complain of severe pain, nausea, anorexia and mild intermittent constipation She also complained of new onset of productive cough She is weak overall The patient denies any recent signs or symptoms of bleeding such as spontaneous epistaxis, hematuria or hematochezia.  Objective:  Vitals:   12/14/17 2130 12/15/17 0457  BP: (!) 140/95 129/84  Pulse: (!) 113 (!) 106  Resp: 19 20  Temp: 99.1 F (37.3 C) 98.4 F (36.9 C)  SpO2: 92% 95%     Intake/Output Summary (Last 24 hours) at 12/15/2017 0936 Last data filed at 12/15/2017 0400 Gross per 24 hour  Intake 2450 ml  Output -  Net 2450 ml    GENERAL:alert, no distress and comfortable.  She appears debilitated SKIN: skin color,  texture, turgor are normal, no rashes or significant lesions EYES: normal, Conjunctiva are pink and non-injected, sclera clear OROPHARYNX:no exudate, no erythema and lips, buccal mucosa, and  tongue normal.  Poor oral dentition is noted NECK: supple, thyroid normal size, non-tender, without nodularity LYMPH:  no palpable lymphadenopathy in the cervical, axillary or inguinal LUNGS: Reduced breath sounds on both lung bases with mild increased respiratory effort HEART: regular rate & rhythm and no murmurs with mild bilateral lower extremity edema ABDOMEN:abdomen soft, distended, well-healed surgical scar, tender on palpation especially in the periumbilical region Musculoskeletal:no cyanosis of digits and no clubbing  NEURO: alert & oriented x 3 with fluent speech, no focal motor/sensory deficits   Labs:  Lab Results  Component Value Date   WBC 12.6 (H) 12/15/2017   HGB 9.4 (L) 12/15/2017   HCT 30.6 (L) 12/15/2017   MCV 74.3 (L) 12/15/2017   PLT 714 (H) 12/15/2017   NEUTROABS 10.3 (H) 12/17/2017    Lab Results  Component Value Date   NA 140 12/15/2017   K 4.1 12/15/2017   CL 106 12/15/2017   CO2 24 12/15/2017    Studies: I have reviewed recent CT imaging US Paracentesis  Result Date: 12/13/2017 INDICATION: Patient with history of stage IV carcinosarcoma of the uterus, ascites. Request made for diagnostic and therapeutic paracentesis. EXAM: ULTRASOUND GUIDED DIAGNOSTIC AND THERAPEUTIC PARACENTESIS MEDICATIONS: None COMPLICATIONS: None immediate. PROCEDURE: Informed written consent was obtained from the patient after a discussion of the risks, benefits and alternatives to treatment. A timeout was performed prior to the initiation of the procedure. Initial ultrasound scanning demonstrates a moderate amount of ascites within the left mid to lower abdominal quadrant. The left mid to lower abdomen was prepped and draped in the usual sterile fashion. 1% lidocaine was used for local anesthesia. Following this, a 19 gauge, 7-cm, Yueh catheter was introduced. An ultrasound image was saved for documentation purposes. The paracentesis was performed. The catheter was removed and a dressing  was applied. The patient tolerated the procedure well without immediate post procedural complication. FINDINGS: A total of approximately 3 liters of hazy, blood-tinged fluid was removed. Samples were sent to the laboratory as requested by the clinical team. IMPRESSION: Successful ultrasound-guided diagnostic and therapeutic paracentesis yielding 3 liters of peritoneal fluid. Read by: Rowe Robert, PA-C Electronically Signed   By: Jacqulynn Cadet M.D.   On: 12/13/2017 11:11   Dg Abd 2 Views  Result Date: 12/14/2017 CLINICAL DATA:  Nausea vomiting and abdominal pain EXAM: ABDOMEN - 2 VIEW COMPARISON:  CT 12/12/2017 FINDINGS: Decubitus views show no definite free air. Enteral contrast is present within the right colon. Development of mild gaseous enlargement of central small bowel loops measuring up to 3.6 cm. Surgical clips in the right upper quadrant. Clips in the left pelvis. IMPRESSION: 1. Interim development of gaseous enlargement of central small bowel loops but with colon gas present and enteral contrast in the right colon, findings could be secondary to an ileus with developing or partial obstruction felt less likely. Electronically Signed   By: Donavan Foil M.D.   On: 12/14/2017 14:37

## 2017-12-15 NOTE — H&P (Signed)
History and Physical    [name]:[id number] DOB: [birth year] DOA: 12/16/2017  PCP: Lin Landsman, MD  Patient coming from: home  I have personally briefly reviewed patient's old medical records in [place]  Chief Complaint: post op AKI, constipation, pain  HPI: Robyn Guerra is Robyn Guerra 62 y.o. 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    She was seen by Dr. Alvy Bimler on 4/5 who noted acute kidney injury as well as the abdominal discomfort and concern for constipation as well.  She was readmitted by Dr. Denman George given her recent surgery.  At that time she had an acute kidney injury, constipation, and pain.  She was admitted for IV fluids and symptom management.  She had Aryelle Figg paracentesis on April 9th with cytology c/w metastatic adenocarcinoma.  CT abdomen pelvis from 4/8 with new R hepatic lobe lesions worrisome for metastatic disease.  Concern that cause of progressive symptoms is progressive cancer.  Dr. Alvy Bimler planning for palliative chemotherapy.   She notes that after surgery she felt ok.  Was hurting, but doing ok.  She was unable to tolerate PO while at home and had pain with eating and drinking.  Pain with movement to abdomen.  She was told to go to ED, but waited until scheduled appointment and she was admitted for AKI, nausea, and abdominal pain.   She describes pain  Occurring in waves, across belly, comes and goes, worse with food.  Coughing and belsching more recently.  No fevers, no SOB.  Nausea, vomited last night.  Only able to tolerate spoonfuls of applesauce yesterday.  Last BM Sunday.  Last passed gas Sunday.   Review of Systems: As per HPI otherwise 10 point review of systems negative.   Past Medical History:  Diagnosis Date  . Cancer [facility])    endometrial  cancer  . Fibroids   . GERD (gastroesophageal reflux disease)   . Hypertension   . Sinusitis     Past Surgical History:  Procedure Laterality Date  . ABDOMINAL HYSTERECTOMY N/Cicley Ganesh 11/24/2017   Procedure: TOTAL HYSTERECTOMY ABDOMINALGREATER 250 GRAMS;  Surgeon: Everitt Amber, MD;  Location: [facility];  Service: Gynecology;  Laterality: N/Merisa Julio;  . DEBULKING N/Deniel Mcquiston 11/24/2017   Procedure: RADICAL TUMOR DEBULKING;  Surgeon: Everitt Amber, MD;  Location: [facility];  Service: Gynecology;  Laterality: N/Thomas Mabry;  . LAPAROTOMY N/Mazell Aylesworth 11/24/2017   Procedure: EXPLORATORY LAPAROTOMY;  Surgeon: Everitt Amber, MD;  Location: [facility];  Service: Gynecology;  Laterality: N/Yalena Colon;  . LYMPH NODE DISSECTION N/Gaylan Fauver 11/24/2017   Procedure: OMENTECTOMY;  Surgeon: Everitt Amber, MD;  Location: [facility];  Service: Gynecology;  Laterality: N/Glenda Kunst;  . MYOMECTOMY    . SALPINGOOPHORECTOMY Bilateral 11/24/2017   Procedure: Merlene Laughter SALPINGO OOPHORECTOMY;  Surgeon: Everitt Amber, MD;  Location: [facility];  Service: Gynecology;  Laterality: Bilateral;     reports that she quit smoking about 39 years ago. Her smoking use included cigarettes. She quit after 4.00 years of use. She has never used smokeless tobacco. She reports that she drinks alcohol. She reports that she does not use drugs.  Allergies  Allergen Reactions  . Codeine Nausea And Vomiting    Room spins    Family History  Problem Relation Age of Onset  . Diabetes Mother   . Prostate cancer Brother 12  .  Colon cancer Paternal Grandmother   . Prostate cancer Brother 99   Prior to Admission medications   Medication Sig Start Date End Date Taking? Authorizing Provider  acetaminophen (TYLENOL) 500 MG tablet Take 500 mg by mouth every 6 (six) hours as needed for moderate pain.   Yes [provider]  BIOTIN PO Take 1 tablet by mouth daily.   Yes [provider]  docusate sodium (COLACE) 100 MG capsule Take 1 capsule (100 mg total) by mouth 2 (two) times daily. 12/08/17  Yes Cross, Melissa  D, NP  enoxaparin (LOVENOX) 40 MG/0.4ML injection Inject 0.4 mLs (40 mg total) into the skin daily for 25 days. 11/28/17 12/23/17 Yes [name], MD  hydrochlorothiazide (HYDRODIURIL) 25 MG tablet Take 1 tablet (25 mg total) by mouth daily. 09/26/17  Yes Lavonia Drafts, MD  ibuprofen (ADVIL,MOTRIN) 200 MG tablet Take 400 mg by mouth every 6 (six) hours as needed for moderate pain.   Yes [provider]  oxyCODONE-acetaminophen (PERCOCET/ROXICET) 5-325 MG tablet Take 1-2 tablets by mouth every 4 (four) hours as needed for severe pain. 12/08/17  Yes Cross, Melissa D, NP  polyethylene glycol (MIRALAX / GLYCOLAX) packet Take 17 g by mouth 2 (two) times daily. 12/08/17  Yes Cross, Melissa D, NP  Wheat Dextrin (BENEFIBER DRINK MIX PO) Take 30 mLs by mouth once.   Yes [provider]  diphenhydramine-acetaminophen (TYLENOL PM) 25-500 MG TABS tablet Take 1 tablet by mouth at bedtime as needed (sleep).    [provider]  ibuprofen (ADVIL,MOTRIN) 600 MG tablet Take 1 tablet (600 mg total) by mouth every 6 (six) hours as needed. Patient not taking: Reported on 12/21/2017 11/01/17   [name], NP    Physical Exam: Vitals:   12/13/17 2117 12/14/17 0533 12/14/17 2130 12/15/17 0457  BP: (!) 139/93 131/79 (!) 140/95 129/84  Pulse: (!) 123 99 (!) 113 (!) 106  Resp: 17 17 19 20   Temp: 98.4 F (36.9 C) 98.7 F (37.1 C) 99.1 F (37.3 C) 98.4 F (36.9 C)  TempSrc: Oral Oral Oral Oral  SpO2: 94% 96% 92% 95%  Weight:      Height:        Constitutional: NAD, calm, comfortable Vitals:   12/13/17 2117 12/14/17 0533 12/14/17 2130 12/15/17 0457  BP: (!) 139/93 131/79 (!) 140/95 129/84  Pulse: (!) 123 99 (!) 113 (!) 106  Resp: 17 17 19 20   Temp: 98.4 F (36.9 C) 98.7 F (37.1 C) 99.1 F (37.3 C) 98.4 F (36.9 C)  TempSrc: Oral Oral Oral Oral  SpO2: 94% 96% 92% 95%  Weight:      Height:       Eyes: PERRL, lids and conjunctivae normal ENMT: Mucous membranes  are moist. Posterior pharynx clear of any exudate or lesions.Normal dentition.  Neck: normal, supple, no masses, no thyromegaly Respiratory: clear to auscultation bilaterally, no wheezing, no crackles. Normal respiratory effort. No accessory muscle use.  Cardiovascular: Regular rate and rhythm, no murmurs / rubs / gallops. No extremity edema. 2+ pedal pulses. No carotid bruits.  Abdomen: diffuse tenderness, no masses palpated. No hepatosplenomegaly. Bowel sounds quiet.  Musculoskeletal: no clubbing / cyanosis. No joint deformity upper and lower extremities. Good ROM, no contractures. Normal muscle tone.  Skin: no rashes, lesions, ulcers. No induration Neurologic: CN 2-12 grossly intact. Sensation intact. Strength 5/5 in all 4.  Psychiatric: Normal judgment and insight. Alert and oriented x 3. Normal mood.   Labs on Admission: I have personally reviewed  following labs and imaging studies  CBC: Recent Labs  Lab 12/06/2017 1443  12/11/17 0421 12/12/17 0424 12/13/17 0809 12/14/17 0411 12/15/17 0413  WBC 13.0*   < > 11.4* 10.3 10.9* 11.2* 12.6*  NEUTROABS 10.3*  --   --   --   --   --   --   HGB 10.5*   < > 9.0* 8.9* 8.9* 9.6* 9.4*  HCT 33.0*   < > 28.8* 28.4* 28.4* 31.2* 30.6*  MCV 71.9*   < > 72.7* 74.0* 73.4* 73.9* 74.3*  PLT 718*   < > 690* 698* 715* 668* 714*   < > = values in this interval not displayed.   Basic Metabolic Panel: Recent Labs  Lab 12/11/17 0421 12/12/17 0424 12/13/17 0809 12/14/17 0411 12/15/17 0413  NA 134* 135 136 136 140  K 3.4* 3.7 3.7 4.2 4.1  CL 101 102 104 103 106  CO2 24 22 21* 20* 24  GLUCOSE 133* 111* 103* 149* 159*  BUN 23* 19 19 19 19   CREATININE 1.37* 1.18* 1.31* 1.09* 1.28*  CALCIUM 8.6* 8.7* 9.0 8.8* 8.9   GFR: Estimated Creatinine Clearance: 56.7 mL/min (Daziah Hesler) (by C-G formula based on SCr of 1.28 mg/dL (H)). Liver Function Tests: Recent Labs  Lab 12/13/2017 1443 12/10/17 0347 12/11/17 0421 12/12/17 0424  AST 14 16 15 15   ALT 18 17 16 17    ALKPHOS 97 77 75 75  BILITOT 0.3 0.3 0.4 0.4  PROT 8.3 7.5 7.3 7.1  ALBUMIN 2.8* 2.6* 2.4* 2.5*   Recent Labs  Lab 12/14/17 0411  LIPASE 16   No results for input(s): AMMONIA in the last 168 hours. Coagulation Profile: No results for input(s): INR, PROTIME in the last 168 hours. Cardiac Enzymes: No results for input(s): CKTOTAL, CKMB, CKMBINDEX, TROPONINI in the last 168 hours. BNP (last 3 results) No results for input(s): PROBNP in the last 8760 hours. HbA1C: No results for input(s): HGBA1C in the last 72 hours. CBG: No results for input(s): GLUCAP in the last 168 hours. Lipid Profile: No results for input(s): CHOL, HDL, LDLCALC, TRIG, CHOLHDL, LDLDIRECT in the last 72 hours. Thyroid Function Tests: No results for input(s): TSH, T4TOTAL, FREET4, T3FREE, THYROIDAB in the last 72 hours. Anemia Panel: No results for input(s): VITAMINB12, FOLATE, FERRITIN, TIBC, IRON, RETICCTPCT in the last 72 hours. Urine analysis:    Component Value Date/Time   COLORURINE YELLOW 11/17/2017 1325   APPEARANCEUR HAZY (Jarad Barth) 11/17/2017 1325   LABSPEC 1.026 11/17/2017 1325   PHURINE 5.0 11/17/2017 1325   GLUCOSEU NEGATIVE 11/17/2017 1325   HGBUR MODERATE (Refugia Laneve) 11/17/2017 1325   BILIRUBINUR NEGATIVE 11/17/2017 1325   KETONESUR NEGATIVE 11/17/2017 1325   PROTEINUR 30 (Carlin Attridge) 11/17/2017 1325   NITRITE NEGATIVE 11/17/2017 1325   LEUKOCYTESUR MODERATE (Jsean Taussig) 11/17/2017 1325    Radiological Exams on Admission: Ct Chest W Contrast  Result Date: 12/15/2017 CLINICAL DATA:  Recurrent uterine cancer with malignant ascites and liver metastasis. EXAM: CT CHEST WITH CONTRAST TECHNIQUE: Multidetector CT imaging of the chest was performed during intravenous contrast administration. CONTRAST:  81mL ISOVUE-300 IOPAMIDOL (ISOVUE-300) INJECTION 61% COMPARISON:  CT 12/12/2017, abdomen FINDINGS: Cardiovascular: No significant vascular findings. Normal heart size. No pericardial effusion. Mediastinum/Nodes: No axillary or  supraclavicular adenopathy. No mediastinal adenopathy. The esophagus newly fluid-filled mildly expanded to 3 cm in diameter. Fluid extends to above the carina and into the thoracic inlet. No obstructing lesions identified Lungs/Pleura: There is consolidation in the LEFT and RIGHT lung base with air bronchograms. Query aspiration  pneumonitis. Upper lungs are clear. Upper Abdomen: Moderate peritoneal free fluid. Fluid-filled stomach. Musculoskeletal: No aggressive osseous lesion. Subcutaneous cyst in the mid line anterior chest wall superficial to the sternum is likely benign. IMPRESSION: 1. Newly dilated fluid-filled esophagus. Concern for bowel obstruction. Recommend clinical correlation and consider NG tube. 2. LEFT and RIGHT lower lobe consolidation. Differential includes aspiration pneumonitis versus pneumonia versus less likely malignancy. 3. Ascites in the upper abdomen. Findings conveyed toFloor Nurse [place] 12/15/2017  at10:53. Electronically Signed   By: Suzy Bouchard M.D.   On: 12/15/2017 10:53   Dg Abd 2 Views  Result Date: 12/14/2017 CLINICAL DATA:  Nausea vomiting and abdominal pain EXAM: ABDOMEN - 2 VIEW COMPARISON:  CT 12/12/2017 FINDINGS: Decubitus views show no definite free air. Enteral contrast is present within the right colon. Development of mild gaseous enlargement of central small bowel loops measuring up to 3.6 cm. Surgical clips in the right upper quadrant. Clips in the left pelvis. IMPRESSION: 1. Interim development of gaseous enlargement of central small bowel loops but with colon gas present and enteral contrast in the right colon, findings could be secondary to an ileus with developing or partial obstruction felt less likely. Electronically Signed   By: Donavan Foil M.D.   On: 12/14/2017 14:37    EKG:  afib with RVR  Assessment/Plan Principal Problem:   Acute prerenal failure (HCC) Active Problems:   Endometrial cancer ([place])   Morbid obesity with BMI of 45.0-49.9, adult  ([place])   Secondary malignant neoplasm of omentum (HCC)   Peritoneal carcinomatosis (HCC)   Iron deficiency anemia due to chronic blood loss   Wound dehiscence, surgical, initial encounter   Abdominal pain   Other constipation   Renal failure, acute ([place])   Renal failure  Pulmonary Embolism: Developed new O2 requirement and tachycardia today.  Addendum to CT scan notes PE.  Started on heparin.  No RV strain on discussion with radiologist.  BP's appropriate. Follow echo Follow LE [place]  Atrial Fibrillation with RVR:  Likely 2/2 above.  Follow echo, TSH.  Metop 5 mg x2.  Dilt gtt.  With persistent RVR, will start amiodarone. Consider cards c/s in AM  Elevated Troponin: no CP, likely demand with PE and RVR.  Continue to trend.  On heparin gtt for PE.  Nausea  Abdominal Pain  SBO:   Chest CT with concern for new dialted fluid filled esophagus, concern for bowel obstruction Repeat CT abdomen pelvis with SBO NGT to LIS Follow with gyn onc for now Repeat x ray in AM  Aspiration Pneumonia:  CT chest with dilated fluid filled esophagus and L and R lower lobe consolidation concerning for aspiration pneumonitis vs pneumonia (vs less likely malignancy).  She's been afebrile.  Initially on room air, but now on supplemental O2, possibly related to PE vs aspiration pneumonia. 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 Alvy Bimler following, recommending PICC and palliative chemotherapy Palliative  c/s for GOC and pain management  Nausea, vomiting, abdominal discomfort: Likely 2/2 above  Cancer associated pain: Appreciate  palliative assistance Dilaudid 1 mg q2 prn Oxycodone 5 mg q4 hrs Oxycontin 10 mg q12 hrs Bowel regimen  Leukocytosis:  Likely reactive in setting of above.  Has been fluctuating during admission.  No fevers during admission.  AKI:  Creatinine 2.66 on presentation, baseline looks to be 1.2-1.3.  Currently back to baseline after IVF.    Anemia:  Iron deficiency.  Receiving IV iron per oncology.  Thrombocytosis: likely reactive 2/2 above  Goals of Care:  Dr. Alvy Bimler discussed with pt today.  Pt desires to be Guerra code based on that discussion.  Palliative care has been c/s as well.  DVT prophylaxis: heparin gtt Code Status: Guerra code  Family Communication: sister at bedside  Disposition Plan: inpatient, pending palliative chemotherapy  Consults called: gyn onc, oncology following Admission status: inpatien   Fayrene Helper MD [facility] Pager [phone]  If 7PM-7AM, please contact night-coverage [email or web address] Password [email or web address]  12/15/2017, 11:11 AM

## 2017-12-16 ENCOUNTER — Inpatient Hospital Stay (HOSPITAL_COMMUNITY): Payer: BLUE CROSS/BLUE SHIELD

## 2017-12-16 DIAGNOSIS — G893 Neoplasm related pain (acute) (chronic): Secondary | ICD-10-CM

## 2017-12-16 DIAGNOSIS — K56609 Unspecified intestinal obstruction, unspecified as to partial versus complete obstruction: Secondary | ICD-10-CM

## 2017-12-16 DIAGNOSIS — I361 Nonrheumatic tricuspid (valve) insufficiency: Secondary | ICD-10-CM

## 2017-12-16 LAB — HEPARIN LEVEL (UNFRACTIONATED)
HEPARIN UNFRACTIONATED: 1.03 [IU]/mL — AB (ref 0.30–0.70)
HEPARIN UNFRACTIONATED: 0.28 [IU]/mL — AB (ref 0.30–0.70)
Heparin Unfractionated: <0.1 IU/mL — ABNORMAL LOW (ref 0.30–0.70)

## 2017-12-16 LAB — BASIC METABOLIC PANEL
ANION GAP: 8 (ref 5–15)
BUN: 23 mg/dL — ABNORMAL HIGH (ref 6–20)
CO2: 23 mmol/L (ref 22–32)
Calcium: 8.3 mg/dL — ABNORMAL LOW (ref 8.9–10.3)
Chloride: 109 mmol/L (ref 101–111)
Creatinine, Ser: 1.55 mg/dL — ABNORMAL HIGH (ref 0.44–1.00)
GFR calc Af Amer: 41 mL/min — ABNORMAL LOW (ref >60)
GFR, EST NON AFRICAN AMERICAN: 35 mL/min — AB (ref >60)
Glucose, Bld: 136 mg/dL — ABNORMAL HIGH (ref 65–99)
POTASSIUM: 4.2 mmol/L (ref 3.5–5.1)
SODIUM: 140 mmol/L (ref 135–145)

## 2017-12-16 LAB — CBC
HCT: 27.2 % — ABNORMAL LOW (ref 36.0–46.0)
Hemoglobin: 8.3 g/dL — ABNORMAL LOW (ref 12.0–15.0)
MCH: 22.7 pg — AB (ref 26.0–34.0)
MCHC: 30.5 g/dL (ref 30.0–36.0)
MCV: 74.5 fL — AB (ref 78.0–100.0)
PLATELETS: 615 10*3/uL — AB (ref 150–400)
RBC: 3.65 MIL/uL — ABNORMAL LOW (ref 3.87–5.11)
RDW: 19.4 % — AB (ref 11.5–15.5)
WBC: 12.3 10*3/uL — ABNORMAL HIGH (ref 4.0–10.5)

## 2017-12-16 LAB — ECHOCARDIOGRAM COMPLETE
Height: 64 in
Weight: 4158.76 oz

## 2017-12-16 LAB — TROPONIN I
Troponin I: 0.11 ng/mL (ref <0.03)
Troponin I: 0.06 ng/mL (ref <0.03)

## 2017-12-16 MED ORDER — SODIUM CHLORIDE 0.9 % IV SOLN: INTRAVENOUS | Status: DC

## 2017-12-16 MED ORDER — SODIUM CHLORIDE 0.9% FLUSH
10.0000 mL | INTRAVENOUS | Status: DC | PRN
  Administered 2017-12-22 – 2017-12-26 (×2): 10 mL
  Filled 2017-12-16 (×2): qty 40

## 2017-12-16 MED ORDER — SODIUM CHLORIDE 0.9% FLUSH
10.0000 mL | Freq: Two times a day (BID) | INTRAVENOUS | Status: DC
  Administered 2017-12-16 – 2017-12-17 (×3): 10 mL
  Administered 2017-12-18 (×2): 30 mL
  Administered 2017-12-20 – 2017-12-21 (×3): 10 mL
  Administered 2017-12-21 – 2017-12-22 (×2): 30 mL
  Administered 2017-12-22 – 2017-12-23 (×2): 10 mL
  Administered 2017-12-23: 30 mL
  Administered 2017-12-24 – 2017-12-25 (×4): 10 mL
  Administered 2017-12-26: 40 mL
  Administered 2017-12-26: 10 mL
  Administered 2017-12-27: 40 mL
  Administered 2017-12-27 – 2017-12-29 (×4): 10 mL

## 2017-12-16 MED ORDER — CHLORHEXIDINE GLUCONATE CLOTH 2 % EX PADS
6.0000 | MEDICATED_PAD | Freq: Every day | CUTANEOUS | Status: DC
  Administered 2017-12-16 – 2017-12-29 (×13): 6 via TOPICAL

## 2017-12-16 MED ORDER — HEPARIN (PORCINE) IN NACL 100-0.45 UNIT/ML-% IJ SOLN
1600.0000 [IU]/h | INTRAMUSCULAR | Status: DC
  Administered 2017-12-17 – 2017-12-19 (×5): 1600 [IU]/h via INTRAVENOUS
  Filled 2017-12-16 (×6): qty 250

## 2017-12-16 MED ORDER — PANTOPRAZOLE SODIUM 40 MG IV SOLR: 40.0000 mg | INTRAVENOUS | Status: DC

## 2017-12-16 MED ORDER — HEPARIN (PORCINE) IN NACL 100-0.45 UNIT/ML-% IJ SOLN
1550.0000 [IU]/h | INTRAMUSCULAR | Status: DC
  Administered 2017-12-16: 1550 [IU]/h via INTRAVENOUS

## 2017-12-16 MED ORDER — DILTIAZEM LOAD VIA INFUSION: 10.0000 mg | Freq: Once | INTRAVENOUS | Status: DC

## 2017-12-16 MED ORDER — DILTIAZEM HCL-DEXTROSE 100-5 MG/100ML-% IV SOLN (PREMIX)
5.0000 mg/h | INTRAVENOUS | Status: DC
  Administered 2017-12-16 (×2): 15 mg/h via INTRAVENOUS
  Administered 2017-12-17: 10 mg/h via INTRAVENOUS
  Administered 2017-12-17: 5 mg/h via INTRAVENOUS
  Filled 2017-12-16 (×5): qty 100

## 2017-12-16 MED ORDER — FAMOTIDINE IN NACL 20-0.9 MG/50ML-% IV SOLN
20.0000 mg | Freq: Two times a day (BID) | INTRAVENOUS | Status: DC
Start: 2017-12-16 — End: 2017-12-25
  Administered 2017-12-16 – 2017-12-24 (×18): 20 mg via INTRAVENOUS
  Filled 2017-12-16 (×18): qty 50

## 2017-12-16 MED ORDER — PHENOL 1.4 % MT LIQD
1.0000 | OROMUCOSAL | Status: DC | PRN
  Administered 2017-12-16 – 2017-12-19 (×3): 1 via OROMUCOSAL
  Filled 2017-12-16: qty 177

## 2017-12-16 NOTE — Progress Notes (Signed)
   12/16/17 1300  Clinical Encounter Type  Visited With Patient and family together  Visit Type Initial;Psychological support;Spiritual support;Critical Care  Referral From Palliative care team  Consult/Referral To Chaplain  Spiritual Encounters  Spiritual Needs Emotional;Other (Comment)  Stress Factors  Patient Stress Factors Health changes;Major life changes;Other (Comment)  Family Stress Factors Health changes;Major life changes;Other (Comment)  Advance Directives (For Healthcare)  Does Patient Have a Medical Advance Directive? No  Would patient like information on creating a medical advance directive? Yes (Inpatient - patient requests chaplain consult to create a medical advance directive) (Paperwork Given)   I visited with the patient per referral from the palliative care team to discuss an Advance Directive.  I provided the Advance Directive paperwork to the patient who requested that a Chaplain visit her tomorrow.   We will follow-up.  Please, contact Spiritual Care for further assistance.   Chaplain Shanon Ace M.Div., [facility]

## 2017-12-16 NOTE — Progress Notes (Signed)
PROGRESS NOTE    Robyn Guerra  ONG:[id number] DOB: Feb 06, 1956 DOA: 12/28/2017 PCP: Lin Landsman, MD     Brief Narrative:  Robyn Guerra is a 62 y.o. female with medical history significant of HTN, iron deficiency anemia, andstage IVcarcinosarcoma of the uterus s/p ex lap, TAH, BSO, omentectomy, radical tumor debulking on 11/24/17 who represented on 4/5 with post op AKI, constipation and pain. She was seen by Dr. Alvy Bimler on 4/5 who noted acute kidney injury as well as the abdominal discomfort and concern for constipation as well.  She was readmitted by Dr. Denman Guerra given her recent surgery. She was admitted for IV fluids and symptom management.  She had a paracentesis on 4/9 with cytology c/w metastatic adenocarcinoma.  CT abdomen pelvis from 4/8 with new R hepatic lobe lesions worrisome for metastatic disease.  Concern that cause of progressive symptoms is progressive cancer.  Dr. Alvy Bimler planning for palliative chemotherapy.  Hospitalization further complicated by pulmonary embolism, atrial fibrillation with RVR, elevated troponin, small bowel obstruction and aspiration pneumonia.  [facility] consulted on 4/11 as primary team for above issues.  Assessment & Plan:   Principal Problem:   Acute prerenal failure (HCC) Active Problems:   Endometrial cancer ([place])   Morbid obesity with BMI of 45.0-49.9, adult (HCC)   Secondary malignant neoplasm of omentum (HCC)   Peritoneal carcinomatosis (HCC)   Iron deficiency anemia due to chronic blood loss   Wound dehiscence, surgical, initial encounter   Abdominal pain   Other constipation   Renal failure, acute (HCC)   Renal failure   Acute pulmonary embolism -CT chest showed proximal right upper lobe pulmonary embolism -Heparin gtt -Echo with reduced RV function -DVT US duplex pending  Atrial Fibrillation with RVR -Currently rate controlled with IV cardizem, IV amiodarone  Elevated Troponin -Due to demand with PE and A Fib RVR -Troponin 0.21  --> 0.06  SBO   -CT A/P showed dilated loops of proximal and mid small bowel -NGT -Per gyn onc team   Aspiration Pneumonia -CT chest with dilated fluid filled esophagus and L and R lower lobe consolidation concerning for aspiration pneumonitis vs pneumonia  -Blood culture negative to date  -Continue Unasyn  Stage IV carcinosarcoma of uterus s/p ex lap, TAH, BSO, omentectomy, radical tumor debulking on 3/21  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 Alvy Bimler following, PICC and palliative chemotherapy planned  -Palliative consulted for GOC and pain management  Cancer associated pain -Pain control medications prn   AKI -Baseline Cr 1.2-1.3 -IVF   Chronic microcytic anemia, thrombocytosis -Monitor CBC    DVT prophylaxis: Heparin gtt Code Status: Full Family Communication: No family at bedside, patient states she will update sister  Disposition Plan: Pending improvement in clinical status   Consultants:   Gyn oncology  Oncology  Antimicrobials:  Anti-infectives (From admission, onward)   Start     Dose/Rate Route Frequency Ordered Stop   12/15/17 1400  Ampicillin-Sulbactam (UNASYN) 3 g in sodium chloride 0.9 % 100 mL IVPB     3 g 200 mL/hr over 30 Minutes Intravenous Every 6 hours 12/15/17 1239         Subjective: Patient without acute complaints, has some sore throat due to NG tube placement and some  abdominal soreness.  Denies any flatus or bowel movements yet.  No nausea.  She states that she does have chest pain which was caused by NG tube, no chest pressure or shortness of breath.  Objective: Vitals:   12/16/17 0700 12/16/17 0720  12/16/17 0815 12/16/17 1200  BP: (!) 137/107 (!) 150/98  (!) 159/64  Pulse: 97 (!) 117  (!) 109  Resp: (!) 31 (!) 38    Temp:   98.4 F (36.9 C) (!) 97.5 F (36.4 C)  TempSrc:   Oral Oral  SpO2: 94% 92%  96%  Weight:      Height:        Intake/Output Summary (Last 24 hours) at 12/16/2017 1218 Last data filed at 12/16/2017 1057 Gross per 24 hour  Intake 3646.93 ml  Output 700 ml  Net 2946.93 ml   Filed Weights   12/13/2017 1704 12/16/17 0316  Weight: 110.2 kg (242 lb 15.2 oz) 117.9 kg (259 lb 14.8 oz)    Examination:  General exam: Appears calm and comfortable  Respiratory system: Clear to auscultation. Respiratory effort normal. Cardiovascular system: S1 & S2 heard, Irregular rhythm rate 100s. No JVD, murmurs, rubs, gallops or clicks. No pedal edema. Gastrointestinal system: Abdomen is nondistended, soft and TTP. No organomegaly or masses felt. Bowel sound decreased. +NGT  Central nervous system: Alert and oriented. No focal neurological deficits. Extremities: Symmetric  Skin: No rashes, lesions or ulcers on exposed skin  Psychiatry: Judgement and insight appear normal. Mood & affect appropriate.   Data Reviewed: I have personally reviewed following labs and imaging studies  CBC: Recent Labs  Lab 01/03/2018 1443  12/12/17 0424 12/13/17 0809 12/14/17 0411 12/15/17 0413 12/16/17 0619  WBC 13.0*   < > 10.3 10.9* 11.2* 12.6* 12.3*  NEUTROABS 10.3*  --   --   --   --   --   --   HGB 10.5*   < > 8.9* 8.9* 9.6* 9.4* 8.3*  HCT 33.0*   < > 28.4* 28.4* 31.2* 30.6* 27.2*  MCV 71.9*   < > 74.0* 73.4* 73.9* 74.3* 74.5*  PLT 718*   < > 698* 715* 668* 714* 615*   < > = values in this interval not displayed.   Basic Metabolic Panel: Recent Labs  Lab 12/12/17 0424 12/13/17 0809 12/14/17 0411 12/15/17 0413 12/16/17 0650  NA 135 136 136 140 140  K 3.7 3.7 4.2 4.1 4.2  CL 102 104 103 106 109  CO2 22 21* 20* 24 23  GLUCOSE 111* 103* 149* 159* 136*  BUN 19 19 19 19  23*    CREATININE 1.18* 1.31* 1.09* 1.28* 1.55*  CALCIUM 8.7* 9.0 8.8* 8.9 8.3*   GFR: Estimated Creatinine Clearance: 48.7 mL/min (A) (by C-G formula based on SCr of 1.55 mg/dL (H)). Liver Function Tests: Recent Labs  Lab 12/11/2017 1443 12/10/17 0347 12/11/17 0421 12/12/17 0424  AST 14 16 15 15   ALT 18 17 16 17   ALKPHOS 97 77 75 75  BILITOT 0.3 0.3 0.4 0.4  PROT 8.3 7.5 7.3 7.1  ALBUMIN 2.8* 2.6* 2.4* 2.5*   Recent Labs  Lab 12/14/17 0411  LIPASE 16   No results for input(s): AMMONIA in the last 168 hours. Coagulation Profile: No results for input(s): INR, PROTIME in the last 168 hours. Cardiac Enzymes: Recent Labs  Lab 12/15/17 1909 12/16/17 0019 12/16/17 0619  TROPONINI 0.21* 0.11* 0.06*   BNP (last 3 results) No results for input(s): PROBNP in the last 8760 hours. HbA1C:  No results for input(s): HGBA1C in the last 72 hours. CBG: No results for input(s): GLUCAP in the last 168 hours. Lipid Profile: No results for input(s): CHOL, HDL, LDLCALC, TRIG, CHOLHDL, LDLDIRECT in the last 72 hours. Thyroid Function Tests: Recent Labs    12/15/17 2241  TSH 0.474   Anemia Panel: No results for input(s): VITAMINB12, FOLATE, FERRITIN, TIBC, IRON, RETICCTPCT in the last 72 hours. Sepsis Labs: Recent Labs  Lab 12/15/17 1546  LATICACIDVEN 1.2    Recent Results (from the past 240 hour(s))  Culture, body fluid-bottle     Status: None (Preliminary result)   Collection Time: 12/13/17 10:00 AM  Result Value Ref Range Status   Specimen Description FLUID PERITONEAL  Final   Special Requests NONE  Final   Culture   Final    NO GROWTH 3 DAYS Performed at [facility] [address], [place], [place] [address]    Report Status PENDING  Incomplete  Gram stain     Status: None   Collection Time: 12/13/17 10:00 AM  Result Value Ref Range Status   Specimen Description FLUID PERITONEAL  Final   Special Requests NONE  Final   Gram Stain   Final    FEW WBC PRESENT,  PREDOMINANTLY PMN NO ORGANISMS SEEN Performed at [facility], [place] [address] [place], [place] [address]    Report Status 12/13/2017 FINAL  Final  Culture, blood (routine x 2)     Status: None (Preliminary result)   Collection Time: 12/15/17  1:38 PM  Result Value Ref Range Status   Specimen Description   Final    BLOOD LEFT HAND Performed at [facility] [address], [place], [place] [address]    Special Requests   Final    AEROBIC BOTTLE ONLY Blood Culture adequate volume Performed at [facility] [address], [place], [place] [address]    Culture   Final    NO GROWTH < 24 HOURS Performed at [facility] [address] [place], [place] [address]    Report Status PENDING  Incomplete  Culture, blood (routine x 2)     Status: None (Preliminary result)   Collection Time: 12/15/17  1:38 PM  Result Value Ref Range Status   Specimen Description   Final    BLOOD RIGHT HAND Performed at [facility] [address], [place], [place] [address]    Special Requests   Final    AEROBIC BOTTLE ONLY Blood Culture adequate volume Performed at [facility] [address], [place], [place] [address]    Culture   Final    NO GROWTH < 24 HOURS Performed at [facility] [address] [place], [place] [address]    Report Status PENDING  Incomplete  MRSA PCR Screening     Status: None   Collection Time: 12/15/17  7:13 PM  Result Value Ref Range Status   MRSA by PCR NEGATIVE NEGATIVE Final    Comment:        The GeneXpert MRSA Assay (FDA approved for NASAL specimens only), is one component of a comprehensive MRSA colonization surveillance program. It is not intended to diagnose MRSA infection nor to guide or monitor treatment for MRSA infections. Performed at [facility], [place] [address], [place], [place] [address]        Radiology Studies: Ct Abdomen  Pelvis Wo Contrast  Result Date: 12/15/2017 CLINICAL DATA:  Abdominal distension. Metastatic endometrial cancer. EXAM: CT ABDOMEN AND PELVIS WITHOUT CONTRAST TECHNIQUE: Multidetector CT imaging of the abdomen and pelvis was performed following the standard protocol without IV contrast. COMPARISON:  CT abdomen pelvis dated December 12, 2017. FINDINGS: Lower chest: Trace bilateral pleural effusions with bilateral lower lobe consolidation as seen on CTA chest from same day. Hepatobiliary: Two low-density lesions within the right hepatic lobe measuring up to 1.5 cm are similar to prior study. Excreted contrast within the gallbladder, which is otherwise unremarkable. No biliary dilatation. Pancreas: Mild atrophy. No ductal dilatation or surrounding inflammatory changes. Spleen: Normal in size without focal abnormality. Adrenals/Urinary Tract: The adrenal glands are unremarkable. No focal renal lesion. Contrast within the bilateral collecting systems and bladder. No hydronephrosis. Stomach/Bowel: Fluid-filled distention of the lower esophagus. New mild dilatation of multiple proximal and mid small bowel loops with a transition point likely in the mid abdomen. Oral contrast is seen within the colon. Vascular/Lymphatic: Mild aortic atherosclerosis. Stable retroperitoneal and right iliac lymphadenopathy. Reproductive: Status post hysterectomy. No adnexal masses. Other: Moderate ascites.  No pneumoperitoneum. Musculoskeletal: No acute or significant osseous findings. IMPRESSION: 1. New mildly dilated loops of proximal and mid small bowel, consistent with obstruction. Transition point is likely in the mid abdomen. 2. Moderate ascites. 3. Unchanged liver lesions and abdominal and right iliac lymphadenopathy, suspicious for metastatic disease. 4. Unchanged trace bilateral pleural effusions and bilateral lower lobe consolidation. 5.  Aortic atherosclerosis (ICD10-I70.0). Electronically Signed   By: Titus Dubin M.D.   On:  12/15/2017 19:35   Dg Abd 1 View  Result Date: 12/16/2017 CLINICAL DATA:  Follow up small bowel obstruction EXAM: ABDOMEN - 1 VIEW COMPARISON:  12/15/2017 FINDINGS: Scattered mildly dilated loops of small bowel are noted. Nasogastric catheter is noted within the stomach. Contrast material is again seen within the colon. Contrast from the recent CT is also noted within the bladder. No bony abnormality is seen. IMPRESSION: Stable small bowel dilatation when compared with the previous exam. Electronically Signed   By: Inez Catalina M.D.   On: 12/16/2017 07:37   Dg Abd 1 View  Result Date: 12/15/2017 CLINICAL DATA:  Diffuse abdominal pain. EXAM: ABDOMEN - 1 VIEW COMPARISON:  12/14/2017. FINDINGS: IV contrast noted in the nondilated right renal collecting system/ureter/bladder. Oral contrast in the right colon. Mild persistent small bowel distention. IMPRESSION: 1. Mild persistent small bowel distention. Oral contrast again noted in the colon. No evidence of progressive bowel distention or free air. 2. IV contrast noted in a nondilated right renal collecting system/ureter, and bladder. Electronically Signed   By: Marcello Moores  Register   On: 12/15/2017 12:57   Ct Chest W Contrast  Addendum Date: 12/15/2017   ADDENDUM REPORT: 12/15/2017 15:53 ADDENDUM: In review, there is a filling defect within the proximal RIGHT upper lobe pulmonary artery consistent with acute pulmonary embolism. The lower lobe pulmonary arteries are not well evaluated due to technique as well as the lower lobe consolidation. Findings conveyed toCroft PA on 12/15/2017  at15:53. Electronically Signed   By: Suzy Bouchard M.D.   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 EXAM: CT CHEST WITH CONTRAST TECHNIQUE: Multidetector CT imaging of the chest was performed during intravenous contrast administration. CONTRAST:  72mL ISOVUE-300 IOPAMIDOL (ISOVUE-300)  INJECTION 61% COMPARISON:  CT 12/12/2017, abdomen FINDINGS: Cardiovascular: No significant vascular findings. Normal heart size. No pericardial effusion. Mediastinum/Nodes: No axillary or supraclavicular adenopathy. No mediastinal adenopathy. The esophagus  newly fluid-filled mildly expanded to 3 cm in diameter. Fluid extends to above the carina and into the thoracic inlet. No obstructing lesions identified Lungs/Pleura: There is consolidation in the LEFT and RIGHT lung base with air bronchograms. Query aspiration pneumonitis. Upper lungs are clear. Upper Abdomen: Moderate peritoneal free fluid. Fluid-filled stomach. Musculoskeletal: No aggressive osseous lesion. Subcutaneous cyst in the mid line anterior chest wall superficial to the sternum is likely benign. IMPRESSION: 1. Newly dilated fluid-filled esophagus. Concern for bowel obstruction. Recommend clinical correlation and consider NG tube. 2. LEFT and RIGHT lower lobe consolidation. Differential includes aspiration pneumonitis versus pneumonia versus less likely malignancy. 3. Ascites in the upper abdomen. Findings conveyed toFloor Nurse [place] 12/15/2017  at10:53. Electronically Signed: By: Suzy Bouchard M.D. On: 12/15/2017 10:53   Dg Abd 2 Views  Result Date: 12/14/2017 CLINICAL DATA:  Nausea vomiting and abdominal pain EXAM: ABDOMEN - 2 VIEW COMPARISON:  CT 12/12/2017 FINDINGS: Decubitus views show no definite free air. Enteral contrast is present within the right colon. Development of mild gaseous enlargement of central small bowel loops measuring up to 3.6 cm. Surgical clips in the right upper quadrant. Clips in the left pelvis. IMPRESSION: 1. Interim development of gaseous enlargement of central small bowel loops but with colon gas present and enteral contrast in the right colon, findings could be secondary to an ileus with developing or partial obstruction felt less likely. Electronically Signed   By: Donavan Foil M.D.   On: 12/14/2017 14:37    Ir Picc Placement Right >5 Yrs Inc Img Guide  Result Date: 12/15/2017 INDICATION: Patient with history recurrent uterine cancer ; central venous access requested for chemotherapy. EXAM: RIGHT UPPER EXTREMITY PICC LINE PLACEMENT WITH ULTRASOUND AND FLUOROSCOPIC GUIDANCE MEDICATIONS: 1% lidocaine to skin and subcutaneous tissue ANESTHESIA/SEDATION: None FLUOROSCOPY TIME:  Fluoroscopy Time: 1 minutes 2 seconds (17 mGy). COMPLICATIONS: None immediate. PROCEDURE: The patient was advised of the possible risks and complications and agreed to undergo the procedure. The patient was then brought to the angiographic suite for the procedure. The right arm was prepped with chlorhexidine, draped in the usual sterile fashion using maximum barrier technique (cap and mask, sterile gown, sterile gloves, large sterile sheet, hand hygiene and cutaneous antisepsis) and infiltrated locally with 1% Lidocaine. Ultrasound demonstrated patency of the right brachial vein, and this was documented with an image. Under real-time ultrasound guidance, this vein was accessed with a 21 gauge micropuncture needle and image documentation was performed. A 0.018 wire was introduced in to the vein. Over this, a 5 [place] double lumen power injectable PICC was advanced to the lower SVC/right atrial junction. Fluoroscopy during the procedure and fluoro spot radiograph confirms appropriate catheter position. The catheter was flushed and covered with a sterile dressing. Catheter length: 44 cm IMPRESSION: Successful right arm power PICC line placement with ultrasound and fluoroscopic guidance. The catheter is ready for use. Read by: Rowe Robert, PA-C Electronically Signed   By: Jacqulynn Cadet M.D.   On: 12/15/2017 12:25      Scheduled Meds: . Chlorhexidine Gluconate Cloth  6 each Topical Daily  . sodium chloride flush  10-40 mL Intracatheter Q12H   Continuous Infusions: . amiodarone 30 mg/hr (12/16/17 1008)  . ampicillin-sulbactam (UNASYN)  IV Stopped (12/16/17 0913)  . dextrose 5 % and 0.9% NaCl 100 mL/hr at 12/16/17 0400  . diltiazem (CARDIZEM) infusion 10 mg/hr (12/16/17 1000)  . famotidine (PEPCID) IV    . heparin       LOS: 4 days  Time spent: 35 minutes   Dessa Phi, DO [facility] [email or web address] Password [facility] 12/16/2017, 12:18 PM

## 2017-12-16 NOTE — Progress Notes (Signed)
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 The patient is symptomatic with severe abdominal pain, nausea, changes in bowel habits and anorexia I have a long discussion with the patient and since her sister Unfortunately, her cancer relapse very quickly since surgery and it is unlikely that she will get better without palliative chemotherapy She understood that she has stage IV disease and treatment is strictly palliative in nature She is now developing multiorgan failure Continue supportive care It is not clear to me that she will be strong enough for chemotherapy next week We will follow  Atrial fibrillation with rapid ventricular response Could be triggered by pneumonia/pulmonary emboli She is being managed medically  Pulmonary emboli Secondary to malignancy Continue anticoagulation therapy  Aspiration pneumonia secondary to peritoneal carcinomatosis She is on antibiotics treatment  Malignant ascites, nausea, changes in bowel habits, likely due to peritoneal carcinomatosis She have signs and symptoms of peritoneal carcinomatosis She has lost a lot of weight due to persistent nausea Her symptoms of nausea has improved with NG placemen  Cancer associated pain She has poorly controlled pain Intravenous pain medicine seems to work the best I recommend palliative care consult  for pain management  Recent acute renal failure secondary to poor oral intake Malignant cachexia Moderate protein calorie malnutrition She has responded to IV fluid resuscitation I recommend continue intermittent IV fluid support  Iron deficiency anemia Component of anemia chronic illness Reactive thrombocytosis Iron studies from last week confirmed component of iron deficiency anemia We will consider intravenous iron once her cardiovascular instability has resolved  Goals of care discussion We have  extensive discussion about goals of care She is overwhelmed with the information provided today and the bad news The patient wants to be full code She understood that treatment is strictly palliative in nature  I recommend palliative care consult for goals of care discussion as well   Discharge planning It is unlikely that she will be ready to go home anytime soon We will follow next week  Heath Lark, MD 12/16/2017  8:35 AM   Subjective:  The patient was transferred to the stepdown unit due to atrial fibrillation with rapid ventricular response and cardiovascular instability.  Blood work revealed elevated troponin.  The patient is started on antiarrhythmics and anticoagulation therapy for symptomatic pulmonary emboli.  She also have NG tube placement due to signs and symptoms of bowel obstruction Her abdominal pain has improved since NG tube placement She denies nausea The patient denies any recent signs or symptoms of bleeding such as spontaneous epistaxis, hematuria or hematochezia.   Objective:  Vitals:   12/16/17 0720 12/16/17 0815  BP: (!) 150/98   Pulse: (!) 117   Resp: (!) 38   Temp:  98.4 F (36.9 C)  SpO2: 92%      Intake/Output Summary (Last 24 hours) at 12/16/2017 0835 Last data filed at 12/16/2017 0400 Gross per 24 hour  Intake 2853.98 ml  Output 0 ml  Net 2853.98 ml    GENERAL:alert, no distress and comfortable.  She appears debilitated.  NG tube in situ SKIN: skin color, texture, turgor are normal, no rashes or significant lesions EYES: normal, Conjunctiva are pale and non-injected, sclera clear OROPHARYNX:no exudate, no erythema and lips, buccal mucosa, and tongue normal  NECK: supple, thyroid normal size, non-tender, without nodularity LYMPH:  no palpable lymphadenopathy in the cervical, axillary or inguinal LUNGS: Reduced breath sounds  on bilateral lung bases with increased breathing effort HEART: Irregular rate and rhythm, no murmurs and no lower  extremity edema ABDOMEN:abdomen soft, non-tender and normal bowel sounds Musculoskeletal:no cyanosis of digits and no clubbing  NEURO: alert & oriented x 3 with fluent speech, no focal motor/sensory deficits   Labs:  Lab Results  Component Value Date   WBC 12.3 (H) 12/16/2017   HGB 8.3 (L) 12/16/2017   HCT 27.2 (L) 12/16/2017   MCV 74.5 (L) 12/16/2017   PLT 615 (H) 12/16/2017   NEUTROABS 10.3 (H) 01/02/2018    Lab Results  Component Value Date   NA 140 12/16/2017   K 4.2 12/16/2017   CL 109 12/16/2017   CO2 23 12/16/2017    Studies:  Ct Abdomen Pelvis Wo Contrast  Result Date: 12/15/2017 CLINICAL DATA:  Abdominal distension. Metastatic endometrial cancer. EXAM: CT ABDOMEN AND PELVIS WITHOUT CONTRAST TECHNIQUE: Multidetector CT imaging of the abdomen and pelvis was performed following the standard protocol without IV contrast. COMPARISON:  CT abdomen pelvis dated December 12, 2017. FINDINGS: Lower chest: Trace bilateral pleural effusions with bilateral lower lobe consolidation as seen on CTA chest from same day. Hepatobiliary: Two low-density lesions within the right hepatic lobe measuring up to 1.5 cm are similar to prior study. Excreted contrast within the gallbladder, which is otherwise unremarkable. No biliary dilatation. Pancreas: Mild atrophy. No ductal dilatation or surrounding inflammatory changes. Spleen: Normal in size without focal abnormality. Adrenals/Urinary Tract: The adrenal glands are unremarkable. No focal renal lesion. Contrast within the bilateral collecting systems and bladder. No hydronephrosis. Stomach/Bowel: Fluid-filled distention of the lower esophagus. New mild dilatation of multiple proximal and mid small bowel loops with a transition point likely in the mid abdomen. Oral contrast is seen within the colon. Vascular/Lymphatic: Mild aortic atherosclerosis. Stable retroperitoneal and right iliac lymphadenopathy. Reproductive: Status post hysterectomy. No adnexal masses.  Other: Moderate ascites.  No pneumoperitoneum. Musculoskeletal: No acute or significant osseous findings. IMPRESSION: 1. New mildly dilated loops of proximal and mid small bowel, consistent with obstruction. Transition point is likely in the mid abdomen. 2. Moderate ascites. 3. Unchanged liver lesions and abdominal and right iliac lymphadenopathy, suspicious for metastatic disease. 4. Unchanged trace bilateral pleural effusions and bilateral lower lobe consolidation. 5.  Aortic atherosclerosis (ICD10-I70.0). Electronically Signed   By: Titus Dubin M.D.   On: 12/15/2017 19:35   Dg Abd 1 View  Result Date: 12/16/2017 CLINICAL DATA:  Follow up small bowel obstruction EXAM: ABDOMEN - 1 VIEW COMPARISON:  12/15/2017 FINDINGS: Scattered mildly dilated loops of small bowel are noted. Nasogastric catheter is noted within the stomach. Contrast material is again seen within the colon. Contrast from the recent CT is also noted within the bladder. No bony abnormality is seen. IMPRESSION: Stable small bowel dilatation when compared with the previous exam. Electronically Signed   By: Inez Catalina M.D.   On: 12/16/2017 07:37   Dg Abd 1 View  Result Date: 12/15/2017 CLINICAL DATA:  Diffuse abdominal pain. EXAM: ABDOMEN - 1 VIEW COMPARISON:  12/14/2017. FINDINGS: IV contrast noted in the nondilated right renal collecting system/ureter/bladder. Oral contrast in the right colon. Mild persistent small bowel distention. IMPRESSION: 1. Mild persistent small bowel distention. Oral contrast again noted in the colon. No evidence of progressive bowel distention or free air. 2. IV contrast noted in a nondilated right renal collecting system/ureter, and bladder. Electronically Signed   By: Marcello Moores  Register   On: 12/15/2017 12:57   Ct Chest W Contrast  Addendum Date:  12/15/2017   ADDENDUM REPORT: 12/15/2017 15:53 ADDENDUM: In review, there is a filling defect within the proximal RIGHT upper lobe pulmonary artery consistent with  acute pulmonary embolism. The lower lobe pulmonary arteries are not well evaluated due to technique as well as the lower lobe consolidation. Findings conveyed toCroft PA on 12/15/2017  at15:53. Electronically Signed   By: Suzy Bouchard M.D.   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 EXAM: CT CHEST WITH CONTRAST TECHNIQUE: Multidetector CT imaging of the chest was performed during intravenous contrast administration. CONTRAST:  14mL ISOVUE-300 IOPAMIDOL (ISOVUE-300) INJECTION 61% COMPARISON:  CT 12/12/2017, abdomen FINDINGS: Cardiovascular: No significant vascular findings. Normal heart size. No pericardial effusion. Mediastinum/Nodes: No axillary or supraclavicular adenopathy. No mediastinal adenopathy. The esophagus newly fluid-filled mildly expanded to 3 cm in diameter. Fluid extends to above the carina and into the thoracic inlet. No obstructing lesions identified Lungs/Pleura: There is consolidation in the LEFT and RIGHT lung base with air bronchograms. Query aspiration pneumonitis. Upper lungs are clear. Upper Abdomen: Moderate peritoneal free fluid. Fluid-filled stomach. Musculoskeletal: No aggressive osseous lesion. Subcutaneous cyst in the mid line anterior chest wall superficial to the sternum is likely benign. IMPRESSION: 1. Newly dilated fluid-filled esophagus. Concern for bowel obstruction. Recommend clinical correlation and consider NG tube. 2. LEFT and RIGHT lower lobe consolidation. Differential includes aspiration pneumonitis versus pneumonia versus less likely malignancy. 3. Ascites in the upper abdomen. Findings conveyed toFloor Nurse [place] 12/15/2017  at10:53. Electronically Signed: By: Suzy Bouchard M.D. On: 12/15/2017 10:53   Dg Abd 2 Views  Result Date: 12/14/2017 CLINICAL DATA:  Nausea vomiting and abdominal pain EXAM: ABDOMEN - 2 VIEW COMPARISON:  CT 12/12/2017 FINDINGS: Decubitus views  show no definite free air. Enteral contrast is present within the right colon. Development of mild gaseous enlargement of central small bowel loops measuring up to 3.6 cm. Surgical clips in the right upper quadrant. Clips in the left pelvis. IMPRESSION: 1. Interim development of gaseous enlargement of central small bowel loops but with colon gas present and enteral contrast in the right colon, findings could be secondary to an ileus with developing or partial obstruction felt less likely. Electronically Signed   By: Donavan Foil M.D.   On: 12/14/2017 14:37   Ir Picc Placement Right >5 Yrs Inc Img Guide  Result Date: 12/15/2017 INDICATION: Patient with history recurrent uterine cancer ; central venous access requested for chemotherapy. EXAM: RIGHT UPPER EXTREMITY PICC LINE PLACEMENT WITH ULTRASOUND AND FLUOROSCOPIC GUIDANCE MEDICATIONS: 1% lidocaine to skin and subcutaneous tissue ANESTHESIA/SEDATION: None FLUOROSCOPY TIME:  Fluoroscopy Time: 1 minutes 2 seconds (17 mGy). COMPLICATIONS: None immediate. PROCEDURE: The patient was advised of the possible risks and complications and agreed to undergo the procedure. The patient was then brought to the angiographic suite for the procedure. The right arm was prepped with chlorhexidine, draped in the usual sterile fashion using maximum barrier technique (cap and mask, sterile gown, sterile gloves, large sterile sheet, hand hygiene and cutaneous antisepsis) and infiltrated locally with 1% Lidocaine. Ultrasound demonstrated patency of the right brachial vein, and this was documented with an image. Under real-time ultrasound guidance, this vein was accessed with a 21 gauge micropuncture needle and image documentation was performed. A 0.018 wire was introduced in to the vein. Over this, a 5 [place] double lumen power injectable PICC was advanced to the lower SVC/right atrial junction. Fluoroscopy during the procedure and fluoro spot radiograph confirms appropriate  catheter  position. The catheter was flushed and covered with a sterile dressing. Catheter length: 44 cm IMPRESSION: Successful right arm power PICC line placement with ultrasound and fluoroscopic guidance. The catheter is ready for use. Read by: Rowe Robert, PA-C Electronically Signed   By: Jacqulynn Cadet M.D.   On: 12/15/2017 12:25

## 2017-12-16 NOTE — Progress Notes (Signed)
Inpatient Progress Note: Gyn-Onc  CC:  1. Abdominal pain and AKI on admit 2. Stage IV uterine cancer; suspected rapid progression with reaccumulation of ascites 3. Pulm embolus 12/15/17 4. Afib with RVR 12/15/17 5. Ileus with question of SBO 12/14/17-present  Assessment/Plan:  Robyn Guerra  is a 62 y.o.  year old with stage IV carcinosarcoma of the uterus s/p ex lap, TAH, BSO, omentectomy, radical tumor debulking on 11/24/17 now readmitted with postop AKI, and pain.   AKI: baseline creatinine elevated at 1.4 . Holding nephrotoxic agents as able. Monitor.  Anemia: Hb stable. No signs of active bleeding. Defer to Dr. Alvy Bimler.  Ascites: secondary to progressive stage IV uterine carcinosarcoma. S/p paracentesis with limited pain relief. Consider repeat paracentesis if remains symptomatic  Abdominal pain:  CT abd/pelvis on 12/12/17 to evaluate for occult source of intra-abdominal pathology causing pain did not show apparent source (other than ascites).  Clinically no sign of infection (eg abscess or perforation)  and normal labs and vital signs therefore the suspicion for postop pathologic intraabdominal process is low. Ascites tap with no growth and negative gram stain, some pain relief with initial paracentesis. Lipase normal - no pancreatitis. Lactic acid - normal prior to PE/Afib episode Plain film 4/10 showed no significant obstruction, however has had ileus starting 4/10 with no improvement.   Ileus versus partial obstruction. Initial contrast from CT 4/8 in colon. No worsening on plain films. NGT is in place. Recommend conservative management for now with NGT/IVF and complete bowel rest. GI prophylaxis initiated  Consider TPN given prolonged malnutrition and possible chemo next week. Defer to primary team and Dr. Alvy Bimler.  Pulm ?PNA versus aspiration on chest CT. Primary team has her on Unasyn. Cultures pending.   HPI: Robyn Guerra is a 62 year old nulliparous woman who is  seen in consultation at the request of Dr Ihor Dow for high grade endometrial cancer, fibroid uterus.  The patient reports vaginal bleeding for since December, 2017. It is daily but light. She was evaluated with a TVUS on 10/03/16 which showed a uterus with measurements: 14.4 x 5.8 x 9.7 cm. There appears to be a 3.8 x 3.2 x 3.0 cm right posterior submucosal fibroid abutting the endometrial canal. A slightly larger 4.8 x 4.1 x 3.0 cm left posterior subserosal fibroid is also seen. The uterus is retroverted in nature. The endometrium was thickened to 1.8 cm. Vague debris and trace fluid are seen within the lower uterine segment and cervix.    She continued to have vaginal bleeding and a repeat US was performed on 08/19/18 which showed uterine measurements: 14.5 x 10.4 x 10.9 cm. Multiple uterine fibroids are seen. The largest fibroid measuring 4.7 cm, as well as a second fibroid measuring 2.8 cm, are located centrally consistent with submucosal fibroids. The posterior subserosal fibroid is also seen measuring 4.4 cm . These show no significant change compared to prior study. Endometrium thickness could not be visualized due to acoustic shadowing from fibroids described above.  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   CT abdo/pelvis on 10/31/17 showed a few small less than 1 cm retroperitoneal lymph nodes are seen in the left paraaortic and aortocaval spaces. 10 mm right lower quadrant mesenteric lymph node is seen. No abdominal aortic aneurysm. Aortic atherosclerosis.  Enlarged uterus measuring 19.5  cm in length. Multiple small uterine fibroids are seen, largest measuring approximately 5cm. Diffuse endometrial thickening is seen measuring 24 mm,consistent with history of known endometrial  carcinosarcoma. Poorly defined heterogeneously enhancing mass is seen in the right adnexa abutting the uterus, which measures 6.1 x 5.5 cm; suspicious for extra uterine extension of malignancy. No evidence of ascites.  On 11/24/17 she underwent ex lap TAH, BSO, omentectomy and radical tumor debulking.  She did well postoperatively however then subsequently developed severe constipation and bloody stools from her bleeding hemorrhoids.  She held the Lovenox for approximately 2 days on March 30 and April 1 however then resumed it.  She try to avoid Percocet although she does have abdominal pain from her constipation.  She had one episode of emesis on March 28 but none since that time.  She is states she is able to keep down oral agents food and liquids.  She is passing flatus.  She denies fevers.  A small amount of her wound has opened at the inferior aspect.  Labs were checked on 12/20/2017 which revealed a mildly elevated white count 13,000, anemia with a hemoglobin of 10.5, thrombocytosis at 718.  Her creatinine was substantially elevated to 2.6.  Of note her preoperative and postoperative creatinine had been stable at 1.4.  Interval Hx:  CT 12/12/08 showed progression of disease with ascties and liver mets (new) but no obstruction, no free air, no abscesses, no bleeding. Ascites drained 12/13/17 (3 L) with minimal to no pain relief. Gram stain negative.  Pain meds increased on 12/14/17 with limited relief in upper abdominal pain. Lipase normal (no evidence of pancreatitis). 12/14/17 - reported emesis (however not preceeded with nausea). Very poor appetite. Plain films showed no obstruction 12/15/17 - No additional emesis. Not passing flatus. Very little oral intake. Pain is moderately controlled.  12/16/17 - PE diagnosed 4/11 along with Afib with RVR. Transferred to stepdown. Now on drip for rate control and heparin for PE. Imaging suggesting concern for possible SBO. NGT in place. Difficult to discern  documentation of output from overnight.   Current Meds:   Current Facility-Administered Medications:  .  [EXPIRED] amiodarone (NEXTERONE PREMIX) 360-4.14 MG/200ML-% (1.8 mg/mL) IV infusion, 60 mg/hr, Intravenous, Continuous, Last Rate: 33.3 mL/hr at 12/16/17 0132, 60 mg/hr at 12/16/17 0132 **FOLLOWED BY** amiodarone (NEXTERONE PREMIX) 360-4.14 MG/200ML-% (1.8 mg/mL) IV infusion, 30 mg/hr, Intravenous, Continuous, Elodia Florence., MD, Last Rate: 16.7 mL/hr at 12/16/17 1008, 30 mg/hr at 12/16/17 1008 .  Ampicillin-Sulbactam (UNASYN) 3 g in sodium chloride 0.9 % 100 mL IVPB, 3 g, Intravenous, Q6H, Elodia Florence., MD, Stopped at 12/16/17 1452 .  Chlorhexidine Gluconate Cloth 2 % PADS 6 each, 6 each, Topical, Daily, Dessa Phi, DO, 6 each at 12/16/17 1016 .  dextrose 5 %-0.9 % sodium chloride infusion, , Intravenous, Continuous, Elodia Florence., MD, Last Rate: 100 mL/hr at 12/16/17 1409 .  [DISCONTINUED] diltiazem (CARDIZEM) 1 mg/mL load via infusion 10 mg, 10 mg, Intravenous, Once **AND** diltiazem (CARDIZEM) 100 mg in dextrose 5% 180mL (1 mg/mL) infusion, 5-15 mg/hr, Intravenous, Continuous, Green, Terri L, RPH, Last Rate: 15 mL/hr at 12/16/17 1406, 15 mg/hr at 12/16/17 1406 .  famotidine (PEPCID) IVPB 20 mg premix, 20 mg, Intravenous, Q12H, Dessa Phi, DO, Stopped at 12/16/17 1355 .  heparin ADULT infusion 100 units/mL (25000 units/243mL sodium chloride 0.45%), 1,550 Units/hr, Intravenous, Continuous, Glogovac, Nikola, RPH, Last Rate: 15.5 mL/hr at 12/16/17 1334, 1,550 Units/hr at 12/16/17 1334 .  HYDROmorphone (DILAUDID)  injection 1 mg, 1 mg, Intravenous, Q2H PRN, Elodia Florence., MD, 1 mg at 12/16/17 1622 .  lidocaine (XYLOCAINE) 1 % (with pres) injection, , , PRN, Allred, Darrell K, PA-C, 10 mL at 12/15/17 1141 .  LORazepam (ATIVAN) injection 0.5 mg, 0.5 mg, Intravenous, Q6H PRN, Elodia Florence., MD, 0.5 mg at 12/16/17 0806 .  [DISCONTINUED] ondansetron  (ZOFRAN) tablet 4 mg, 4 mg, Oral, Q6H PRN, 4 mg at 12/10/17 0116 **OR** ondansetron (ZOFRAN) injection 4 mg, 4 mg, Intravenous, Q6H PRN, Elodia Florence., MD, 4 mg at 12/14/17 1712 .  phenol (CHLORASEPTIC) mouth spray 1 spray, 1 spray, Mouth/Throat, PRN, Dessa Phi, DO, 1 spray at 12/16/17 1411 .  sodium chloride flush (NS) 0.9 % injection 10-40 mL, 10-40 mL, Intracatheter, Q12H, Dessa Phi, DO, 10 mL at 12/16/17 1010 .  sodium chloride flush (NS) 0.9 % injection 10-40 mL, 10-40 mL, Intracatheter, PRN, Dessa Phi, DO  No facility-administered encounter medications on file as of 11/01/2017.      Labs:  CBC    Component Value Date/Time   WBC 12.3 (H) 12/16/2017 0619   RBC 3.65 (L) 12/16/2017 0619   HGB 8.3 (L) 12/16/2017 0619   HCT 27.2 (L) 12/16/2017 0619   PLT 615 (H) 12/16/2017 0619   MCV 74.5 (L) 12/16/2017 0619   MCH 22.7 (L) 12/16/2017 0619   MCHC 30.5 12/16/2017 0619   RDW 19.4 (H) 12/16/2017 0619   LYMPHSABS 1.2 12/15/2017 1443   MONOABS 1.0 (H) 12/28/2017 1443   EOSABS 0.4 12/15/2017 1443   BASOSABS 0.1 12/30/2017 1443   BMET    Component Value Date/Time   NA 140 12/16/2017 0650   NA 140 10/27/2017 1423   K 4.2 12/16/2017 0650   CL 109 12/16/2017 0650   CO2 23 12/16/2017 0650   GLUCOSE 136 (H) 12/16/2017 0650   BUN 23 (H) 12/16/2017 0650   BUN 14 10/27/2017 1423   CREATININE 1.55 (H) 12/16/2017 0650   CALCIUM 8.3 (L) 12/16/2017 0650   GFRNONAA 35 (L) 12/16/2017 0650   GFRAA 41 (L) 12/16/2017 0650     Vitals:  Blood pressure 131/66, pulse (!) 115, temperature (!) 97.5 F (36.4 C), temperature source Oral, resp. rate (!) 38, height 5\' 4"  (1.626 m), weight 259 lb 14.8 oz (117.9 kg), last menstrual period 04/20/2016, SpO2 94 %.  Physical Exam: WD in NAD, in mild distress; appears more comfortable with the exception of being connected to multiple lines Lungs CTA upper field, decreased bilateral bases CV - irregular rhythm/rate Abdo - occasional  decreased BS. Soft, NT, mild-mod distension. No rebound or guarding. Ext - minimal pitting edema. NT.   Isabel Caprice, MD  12/16/2017, 5:13 PM

## 2017-12-16 NOTE — Progress Notes (Signed)
ANTICOAGULATION CONSULT NOTE - [place] for heparin Indication: pulmonary embolus  Allergies  Allergen Reactions  . Codeine Nausea And Vomiting    Room spins    Patient Measurements: Height: 5\' 4"  (162.6 cm) Weight: 259 lb 14.8 oz (117.9 kg) IBW/kg (Calculated) : 54.7 Heparin Dosing Weight: 81kg  Vital Signs: Temp: 98 F (36.7 C) (04/12 2006) Temp Source: Oral (04/12 2006) BP: 131/66 (04/12 1600) Pulse Rate: 115 (04/12 1600)  Labs: Recent Labs    12/14/17 0411 12/15/17 0413 12/15/17 1909 12/15/17 2241 12/16/17 0019 12/16/17 0619 12/16/17 0650 12/16/17 1125 12/16/17 1939  HGB 9.6* 9.4*  --   --   --  8.3*  --   --   --   HCT 31.2* 30.6*  --   --   --  27.2*  --   --   --   PLT 668* 714*  --   --   --  615*  --   --   --   HEPARINUNFRC  --   --   --  <0.10*  --   --   --  1.03* 0.28*  CREATININE 1.09* 1.28*  --   --   --   --  1.55*  --   --   TROPONINI  --   --  0.21*  --  0.11* 0.06*  --   --   --     Estimated Creatinine Clearance: 48.7 mL/min (A) (by C-G formula based on SCr of 1.55 mg/dL (H)).  Assessment: 44 YOF with stage IV uterine cancer.  She underwent ex lap with TAH, BSO, omentectomy and debulking on 3/21.  CT chest re-reviewed and determined to have RUL PE.   Pharmacy asked to dose heparin gtt.    Today, 12/16/2017  CBC: Anemia noted, Hgb with drop from 9.4 to 8.3 , pltc elevated, likely reactive   HL is 1.03, supratherapeutic  Renal: SCr increased to 1.55, per notes baseline SCr is 1.4  No line or bleeding issues per RN  2nd shift: heparin level 0.28 after heparin held x 1 hour and rate reduced to 1550 units/hr- near low end of 0.3 - 0.7 goal  Goal of Therapy:  Heparin level 0.3-0.7 units/ml Monitor platelets by anticoagulation protocol: Yes   Plan:   Increase heparin to 1600 units/hr and check am HL  Daily heparin level and CBC  Await plan for long-term anticoagulation (LMWH vs DOAC)  Monitor for signs and  symptoms of bleeding  Eudelia Bunch, Pharm.D. [phone] 12/16/2017 8:50 PM

## 2017-12-16 NOTE — Progress Notes (Signed)
ANTICOAGULATION CONSULT NOTE - Follow Up Consult  Pharmacy Consult for Heparin Indication: pulmonary embolus  Allergies  Allergen Reactions  . Codeine Nausea And Vomiting    Room spins    Patient Measurements: Height: 5\' 4"  (162.6 cm) Weight: 242 lb 15.2 oz (110.2 kg) IBW/kg (Calculated) : 54.7 Heparin Dosing Weight:   Vital Signs: Temp: 99.2 F (37.3 C) (04/12 0001) Temp Source: Oral (04/12 0001) BP: 97/62 (04/12 0100) Pulse Rate: 99 (04/12 0100)  Labs: Recent Labs    12/13/17 0809 12/14/17 0411 12/15/17 0413 12/15/17 1909 12/15/17 2241 12/16/17 0019  HGB 8.9* 9.6* 9.4*  --   --   --   HCT 28.4* 31.2* 30.6*  --   --   --   PLT 715* 668* 714*  --   --   --   HEPARINUNFRC  --   --   --   --  <0.10*  --   CREATININE 1.31* 1.09* 1.28*  --   --   --   TROPONINI  --   --   --  0.21*  --  0.11*    Estimated Creatinine Clearance: 56.7 mL/min (A) (by C-G formula based on SCr of 1.28 mg/dL (H)).   Medications:  Infusions:  . amiodarone 60 mg/hr (12/16/17 0132)   Followed by  . amiodarone    . ampicillin-sulbactam (UNASYN) IV Stopped (12/15/17 1540)  . dextrose 5 % and 0.9% NaCl 100 mL/hr at 12/16/17 0000  . diltiazem (CARDIZEM) infusion 10 mg/hr (12/16/17 0000)  . heparin 1,400 Units/hr (12/16/17 0000)    Assessment: Patient with low heparin level.  No heparin issues per RN.  Goal of Therapy:  Heparin level 0.3-0.7 units/ml Monitor platelets by anticoagulation protocol: Yes   Plan:  Increase heparin to 1650 units/hr Recheck level at [place] 12/16/2017,2:54 AM

## 2017-12-16 NOTE — Progress Notes (Signed)
  Echocardiogram 2D Echocardiogram has been performed.  Robyn Guerra 12/16/2017, 9:50 AM

## 2017-12-16 NOTE — Progress Notes (Signed)
GYN Oncology  Subjective: Patient reports "doing better" at this time.  She states she woke up feeling out of sorts and anxious but that has improved.  States she felt somewhat better after having the paracentesis but she knew that did not solve her problem with the abdominal pain.  She states she still has pain at this time in the upper abdomen and lower abdomen but the pain is different from before the paracentesis and not as intense.  Reporting throat irritation from the NG tube but states her symptoms from yesterday of constant reflux and regurgitation have improved.  She has not passed flatus for 4 days and last BM was on Sunday.  States she has having chest discomfort earlier but that has resolved.  Does not necessarily feel short of breath but saying 2-4 words then taking a breath.  No concerns voiced.  Stating she is ready to feel better and get started on treatment.      Objective: Vital signs in last 24 hours: Temp:  [98.1 F (36.7 C)-100.2 F (37.9 C)] 98.4 F (36.9 C) (04/12 0815) Pulse Rate:  [90-198] 117 (04/12 0720) Resp:  [18-39] 38 (04/12 0720) BP: (97-159)/(45-140) 150/98 (04/12 0720) SpO2:  [73 %-94 %] 92 % (04/12 0720) Weight:  [259 lb 14.8 oz (117.9 kg)] 259 lb 14.8 oz (117.9 kg) (04/12 0316) Last BM Date: 12/11/17  Intake/Output from previous day: 04/11 0701 - 04/12 0700 In: 3117.3 [I.V.:3117.3] Out: 0   Physical Examination: General: alert, cooperative, no distress and saying 2-4 words then taking a breath Resp: clear to auscultation bilaterally and diminished in the bases Cardio: irregularly irregular rhythm GI: incision: midline incision well healed, small opening at lower portion of the incision-no signs of infection, no drainage noted and abdomen obese, slightly distended, mildy tympanic in the upper quadrant, hypoactive bowel sounds Extremities: extremities normal, atraumatic, no cyanosis or edema NG tube to suction with 450 cc in the  canister  Labs: WBC/Hgb/Hct/Plts:  12.3/8.3/27.2/615 (04/12 9509) BUN/Cr/glu/ALT/AST/amyl/lip:  23/1.55/--/--/--/--/-- (04/12 3267)   CT chest 12/15/17: Addendum:  In review, there is a filling defect within the proximal RIGHT upper lobe pulmonary artery consistent with acute pulmonary embolism. The lower lobe pulmonary arteries are not well evaluated due to technique as well as the lower lobe consolidation. IMPRESSION: 1. Newly dilated fluid-filled esophagus. Concern for bowel obstruction. Recommend clinical correlation and consider NG tube. 2. LEFT and RIGHT lower lobe consolidation. Differential includes aspiration pneumonitis versus pneumonia versus less likely malignancy. 3. Ascites in the upper abdomen.  CT AP 12/15/17: IMPRESSION: 1. New mildly dilated loops of proximal and mid small bowel, consistent with obstruction. Transition point is likely in the mid abdomen. 2. Moderate ascites. 3. Unchanged liver lesions and abdominal and right iliac lymphadenopathy, suspicious for metastatic disease. 4. Unchanged trace bilateral pleural effusions and bilateral lower lobe consolidation. 5.  Aortic atherosclerosis (ICD10-I70.0).  Assessment: 62 y.o. s/p ex lap TAH, BSO, omentectomy and radical tumor debulking on 11/24/17 for stage IV carcinosarcoma readmitted on 12/08/2017 for post-op AKI and abdominal pain.  See Dr. Gerarda Fraction note for full HPI.  Hospitalization complicated currently with PE noted on CT chest, A fib with RVR, elevated troponin, aspiration pneumonia, SB obstruction.   Pain:  Pain described as different but somewhat better than pain experienced previously.  Continue current regimen.  Heme: Hgb 8.3 and Hct 27.2 this am. No signs of active bleeding.  ID: Currently on Unasyn for aspiration pneumonia.  CV: On IV amiodarone and IV cardizem for A  fib.    GI:  Tolerating po: No: NPO with NG tube.     GU: Creatinine 1.55 this am. 250 cc output reported. Returned to baseline.     Prophylaxis: Currently on Heparin drip for PE in right upper lobe  Plan: Continue plan per Hospitalist Team, Dr. Alvy Bimler, and GYN ONC Dr. Gerarda Fraction to see patient later today   LOS: 4 days    Robyn Guerra 12/16/2017, 10:57 AM

## 2017-12-16 NOTE — Progress Notes (Signed)
ANTICOAGULATION CONSULT NOTE - [place] for heparin Indication: pulmonary embolus  Allergies  Allergen Reactions  . Codeine Nausea And Vomiting    Room spins    Patient Measurements: Height: 5\' 4"  (162.6 cm) Weight: 259 lb 14.8 oz (117.9 kg) IBW/kg (Calculated) : 54.7 Heparin Dosing Weight: 81kg  Vital Signs: Temp: 98.4 F (36.9 C) (04/12 0815) Temp Source: Oral (04/12 0815) BP: 159/64 (04/12 1200) Pulse Rate: 109 (04/12 1200)  Labs: Recent Labs    12/14/17 0411 12/15/17 0413 12/15/17 1909 12/15/17 2241 12/16/17 0019 12/16/17 0619 12/16/17 0650 12/16/17 1125  HGB 9.6* 9.4*  --   --   --  8.3*  --   --   HCT 31.2* 30.6*  --   --   --  27.2*  --   --   PLT 668* 714*  --   --   --  615*  --   --   HEPARINUNFRC  --   --   --  <0.10*  --   --   --  1.03*  CREATININE 1.09* 1.28*  --   --   --   --  1.55*  --   TROPONINI  --   --  0.21*  --  0.11* 0.06*  --   --     Estimated Creatinine Clearance: 48.7 mL/min (A) (by C-G formula based on SCr of 1.55 mg/dL (H)).   Medical History: Past Medical History:  Diagnosis Date  . Cancer [facility])    endometrial cancer  . Fibroids   . GERD (gastroesophageal reflux disease)   . Hypertension   . Sinusitis     Assessment: 70 YOF with stage IV uterine cancer.  She underwent ex lap with TAH, BSO, omentectomy and debulking on 3/21.  CT chest re-reviewed and determined to have RUL PE.   Pharmacy asked to dose heparin gtt.    Today, 12/16/2017  CBC: Anemia noted, Hgb with drop from 9.4 to 8.3 , pltc elevated, likely reactive   HL is 1.03, supratherapeutic  Renal: SCr increased to 1.55, per notes baseline SCr is 1.4  No line or bleeding issues per RN  Goal of Therapy:  Heparin level 0.3-0.7 units/ml Monitor platelets by anticoagulation protocol: Yes   Plan:   Hold infusion for one hour due to elevated HL  Restart heparin infusion after on hour at 1550 units/hr  Draw heparin level 6 hours  after restart  Daily heparin level and CBC  Await plan for long-term anticoagulation (LMWH vs DOAC)  Monitor for signs and symptoms of bleeding   Royetta Asal, PharmD, BCPS Pager [phone] 12/16/2017 12:15 PM

## 2017-12-17 ENCOUNTER — Inpatient Hospital Stay (HOSPITAL_COMMUNITY): Payer: BLUE CROSS/BLUE SHIELD

## 2017-12-17 DIAGNOSIS — I2699 Other pulmonary embolism without acute cor pulmonale: Secondary | ICD-10-CM

## 2017-12-17 LAB — HEPARIN LEVEL (UNFRACTIONATED)
HEPARIN UNFRACTIONATED: 0.47 [IU]/mL (ref 0.30–0.70)
HEPARIN UNFRACTIONATED: 0.4 [IU]/mL (ref 0.30–0.70)

## 2017-12-17 LAB — BASIC METABOLIC PANEL
ANION GAP: 9 (ref 5–15)
BUN: 18 mg/dL (ref 6–20)
CALCIUM: 8.2 mg/dL — AB (ref 8.9–10.3)
CO2: 21 mmol/L — ABNORMAL LOW (ref 22–32)
Chloride: 111 mmol/L (ref 101–111)
Creatinine, Ser: 1.26 mg/dL — ABNORMAL HIGH (ref 0.44–1.00)
GFR, EST AFRICAN AMERICAN: 53 mL/min — AB (ref >60)
GFR, EST NON AFRICAN AMERICAN: 45 mL/min — AB (ref >60)
Glucose, Bld: 126 mg/dL — ABNORMAL HIGH (ref 65–99)
Potassium: 3.7 mmol/L (ref 3.5–5.1)
Sodium: 141 mmol/L (ref 135–145)

## 2017-12-17 LAB — CBC
HCT: 28.2 % — ABNORMAL LOW (ref 36.0–46.0)
HEMOGLOBIN: 8.5 g/dL — AB (ref 12.0–15.0)
MCH: 22.6 pg — ABNORMAL LOW (ref 26.0–34.0)
MCHC: 30.1 g/dL (ref 30.0–36.0)
MCV: 75 fL — ABNORMAL LOW (ref 78.0–100.0)
Platelets: 562 10*3/uL — ABNORMAL HIGH (ref 150–400)
RBC: 3.76 MIL/uL — AB (ref 3.87–5.11)
RDW: 19.4 % — ABNORMAL HIGH (ref 11.5–15.5)
WBC: 10.4 10*3/uL (ref 4.0–10.5)

## 2017-12-17 MED ORDER — LIP MEDEX EX OINT
TOPICAL_OINTMENT | CUTANEOUS | Status: AC
  Administered 2017-12-17: 16:00:00
  Filled 2017-12-17: qty 7

## 2017-12-17 NOTE — Progress Notes (Signed)
VASCULAR LAB PRELIMINARY  PRELIMINARY  PRELIMINARY  PRELIMINARY  Bilateral lower extremity venous duplex completed.    Preliminary report:  There is no DVT or SVT noted in the bilateral lower extremities.   Antonio Woodhams, RVT 12/17/2017, 10:33 AM

## 2017-12-17 NOTE — Progress Notes (Signed)
ANTICOAGULATION CONSULT NOTE - [place] for heparin Indication: pulmonary embolus  Allergies  Allergen Reactions  . Codeine Nausea And Vomiting    Room spins    Patient Measurements: Height: 5\' 4"  (162.6 cm) Weight: 259 lb 14.8 oz (117.9 kg) IBW/kg (Calculated) : 54.7 Heparin Dosing Weight: 81kg  Vital Signs: Temp: 97.7 F (36.5 C) (04/13 0750) Temp Source: Oral (04/13 0750) BP: 159/83 (04/13 0800) Pulse Rate: 84 (04/13 0800)  Labs: Recent Labs    12/15/17 0413 12/15/17 1909  12/16/17 0019 12/16/17 0619 12/16/17 0650 12/16/17 1125 12/16/17 1939 12/17/17 0647  HGB 9.4*  --   --   --  8.3*  --   --   --  8.5*  HCT 30.6*  --   --   --  27.2*  --   --   --  28.2*  PLT 714*  --   --   --  615*  --   --   --  562*  HEPARINUNFRC  --   --    < >  --   --   --  1.03* 0.28* 0.47  CREATININE 1.28*  --   --   --   --  1.55*  --   --  1.26*  TROPONINI  --  0.21*  --  0.11* 0.06*  --   --   --   --    < > = values in this interval not displayed.    Estimated Creatinine Clearance: 60 mL/min (A) (by C-G formula based on SCr of 1.26 mg/dL (H)).   Medical History: Past Medical History:  Diagnosis Date  . Cancer [facility])    endometrial cancer  . Fibroids   . GERD (gastroesophageal reflux disease)   . Hypertension   . Sinusitis     Assessment: 11 YOF with stage IV uterine cancer.  She underwent ex lap with TAH, BSO, omentectomy and debulking on 3/21.  CT chest re-reviewed and determined to have RUL PE.   Pharmacy asked to dose heparin gtt.    Today, 12/17/2017  Heparin level therapeutic on current IV heparin rate of 1600 units/hr  CBC: Hgb low but stable. Plts 562 improving  Renal: SCr 1.26 improving, per notes baseline SCr is 1.4  No line or bleeding issues per RN  Goal of Therapy:  Heparin level 0.3-0.7 units/ml Monitor platelets by anticoagulation protocol: Yes   Plan:  1) Continue IV heparin at current rate of 1600 units/hr 2)  Recheck heparin level at 1500 to confirm continued goal level at current rate 3) Await plan for long-term anticoagulation (LMWH vs DOAC) 4) Monitor for signs and symptoms of bleeding 5) Daily heparin level and CBC   Adrian Saran, PharmD, BCPS Pager [phone] 12/17/2017 9:35 AM

## 2017-12-17 NOTE — Progress Notes (Signed)
Daily Progress Note   Patient Name: Robyn Guerra       Date: 12/17/2017 DOB: 12-08-1955  Age: 62 y.o. MRN#: [id number] Attending Physician: Dessa Phi, DO Primary Care Physician: Lin Landsman, MD Admit Date: 12/31/2017  Reason for Consultation/Follow-up: Pain control  Subjective: I saw and examined Robyn Guerra this AM.  She reports that she continues to have pain, but it has been fairly well controlled with prn dilaudid.    She reports being happy with current pain management regimen.  Current pain 5/10, which she finds tolerable and states is "much, much better" than admission.  Length of Stay: 5  Current Medications: Scheduled Meds:  . Chlorhexidine Gluconate Cloth  6 each Topical Daily  . sodium chloride flush  10-40 mL Intracatheter Q12H    Continuous Infusions: . amiodarone 30 mg/hr (12/17/17 0946)  . ampicillin-sulbactam (UNASYN) IV Stopped (12/17/17 0910)  . dextrose 5 % and 0.9% NaCl 100 mL/hr at 12/17/17 1050  . diltiazem (CARDIZEM) infusion 10 mg/hr (12/17/17 1100)  . famotidine (PEPCID) IV Stopped (12/17/17 1144)  . heparin 1,600 Units/hr (12/17/17 1100)    PRN Meds: HYDROmorphone (DILAUDID) injection, lidocaine, LORazepam, [DISCONTINUED] ondansetron **OR** ondansetron (ZOFRAN) IV, phenol, sodium chloride flush  Physical Exam       General: Alert, awake, in no acute distress. NGT in place HEENT: No bruits, no goiter, no JVD Heart: irregular. No murmur appreciated. Lungs: Good air movement, clear Abdomen: Soft, nontender, nondistended, positive bowel sounds.  Ext: No significant edema Skin: Warm and dry Neuro: Grossly intact, nonfocal.      Vital Signs: BP (!) 144/84   Pulse 83   Temp 97.6 F (36.4 C)  (Oral)   Resp (!) 29   Ht 5\' 4"  (1.626 m)   Wt 117.9 kg (259 lb 14.8 oz)   LMP 04/20/2016   SpO2 98%   BMI 44.62 kg/m  SpO2: SpO2: 98 % O2 Device: O2 Device: Nasal Cannula O2 Flow Rate: O2 Flow Rate (L/min): 6 L/min  Intake/output summary:   Intake/Output Summary (Last 24 hours) at 12/17/2017 1229 Last data filed at  12/17/2017 1144 Gross per 24 hour  Intake 4618.53 ml  Output 850 ml  Net 3768.53 ml   LBM: Last BM Date: 12/11/17 Baseline Weight: Weight: 110.2 kg (242 lb 15.2 oz) Most recent weight: Weight: 117.9 kg (259 lb 14.8 oz)       Palliative Assessment/Data:    Flowsheet Rows     Most Recent Value  Intake Tab  Referral Department  Hospitalist  Unit at Time of Referral  ICU  Palliative Care Primary Diagnosis  Cancer  Date Notified  12/15/17  Palliative Care Type  New Palliative care  Reason for referral  Clarify Goals of Care, Pain  Date of Admission  12/08/2017  Date first seen by Palliative Care  12/16/17  # of days Palliative referral response time  1 Day(s)  # of days IP prior to Palliative referral  6  Clinical Assessment  Palliative Performance Scale Score  50%  Pain Max last 24 hours  8  Pain Min Last 24 hours  3  Psychosocial & Spiritual Assessment  Palliative Care Outcomes  Patient/Family meeting held?  Yes  Palliative Care Outcomes  Clarified goals of care      Patient Active Problem List   Diagnosis Date Noted  . Renal failure 12/12/2017  . Iron deficiency anemia due to chronic blood loss 01/02/2018  . Wound dehiscence, surgical, initial encounter 12/15/2017  . Acute prerenal failure ([place]) 12/23/2017  . Abdominal pain 12/22/2017  . Other constipation 12/17/2017  . Family history of cancer 01/03/2018  . Renal failure, acute ([place]) 12/17/2017  . 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 Peritoneal carcinomatosis ([place])   . Endometrial cancer ([place]) 11/01/2017  . Morbid obesity with BMI of 45.0-49.9, adult ([place]) 11/01/2017     Palliative Care Assessment & Plan   Patient Profile: 62 y.o. female  with past medical history of recurrent uterine cancer status post ex lap, TAH, BSO, and tumor debulking on 3/21 with malignant ascites and liver metastases admitted on 12/19/2017 with uncontrolled pain and acute renal failure.  Her hospital course has been further complicated by pulmonary embolus, A. fib with RVR, elevated troponin, aspiration pneumonia, and small bowel obstruction.  Palliative consulted for pain management and goals of care.  Recommendations/Plan: -Pain: reports currently well controlled.  Recommend continue with intermittent IV pain medications at this time.  Hopefully her small bowel obstruction will resolve and we can work on transitioning back to effective oral regimen at that time. - Noted advance directive paperwork at bedside.  She would like to name her sister as her surrogate Media pl[employer].  Appreciate spiritual care assistance in facilitating this and also emotionally supporting [name].  Goals of Care and Additional Recommendations:  Limitations on Scope of Treatment: Full Scope Treatment  Code Status:    Code Status Orders  (From admission, onward)        Start     Ordered   12/07/2017 1638  Full code  Continuous     12/26/2017 1647    Code Status History    Date Active Date Inactive Code Status Order ID Comments User Context   11/24/2017 1904 11/27/2017 2046 Full Code [id number]  Isabel Caprice, MD Inpatient       Prognosis:   Unable to determine  Discharge Planning:  To Be Determined  Care plan was discussed with patient, rn  Thank you for allowing the Palliative Medicine Team to assist in the care of this patient.   Total Time 20 minutes Prolonged  Time Billed  no       Greater than 50%  of this time was spent counseling and coordinating care related to the above assessment and plan.  Micheline Rough, MD  Please contact Palliative Medicine Team phone at [phone]  for questions and concerns.

## 2017-12-17 NOTE — Progress Notes (Signed)
Inpatient Progress Note: Gyn-Onc  CC:  1. Abdominal pain and AKI on admit 2. Stage IV uterine cancer; suspected rapid progression with reaccumulation of ascites 3. Pulm embolus 12/15/17 4. Afib with RVR 12/15/17 5. Ileus with question of SBO 12/14/17-present 6. Aspiration PNA versus other  Assessment/Plan:  [name]  is a 62 y.o.  year old with stage IV carcinosarcoma of the uterus s/p ex lap, TAH, BSO, omentectomy, radical tumor debulking on 11/24/17 now readmitted with postop AKI, and pain.   AKI: baseline creatinine elevated at 1.4 . Holding nephrotoxic agents as able. Monitor.  Anemia: Hb stable. No signs of active bleeding. Defer to Dr. Alvy Bimler.  Ascites: secondary to progressive stage IV uterine carcinosarcoma. S/p paracentesis with limited pain relief. Consider repeat paracentesis if remains symptomatic.  Abdominal pain:  CT abd/pelvis on 12/12/17 to evaluate for occult source of intra-abdominal pathology causing pain did not show apparent source (other than ascites).  Clinically no sign of infection (eg abscess or perforation)  and normal labs and vital signs therefore the suspicion for postop pathologic intraabdominal process is low. Ascites tap with no growth and negative gram stain, some pain relief with initial paracentesis. Lipase normal - no pancreatitis. Lactic acid - normal prior to PE/Afib episode Plain film 4/10 showed no significant obstruction, however has had ileus starting 4/10    Ileus versus partial obstruction. Initial contrast from CT 4/8 in colon. No worsening on plain films. NGT is in place. Recommend conservative management for now with NGT/IVF and complete bowel rest. GI prophylaxis in place Consider TPN given prolonged malnutrition and possible chemo next week.-- Defer to primary team and Dr. Alvy Bimler.  Pulm ?PNA versus aspiration on chest CT. Primary team has her on Unasyn. Cultures pending.   HPI: [name] is a 62 year old nulliparous  woman who is seen in consultation at the request of Dr Robyn Guerra for high grade endometrial cancer, fibroid uterus.  The patient reports vaginal bleeding for since December, 2017. It is daily but light. She was evaluated with a TVUS on 10/03/16 which showed a uterus with measurements: 14.4 x 5.8 x 9.7 cm. There appears to be a 3.8 x 3.2 x 3.0 cm right posterior submucosal fibroid abutting the endometrial canal. A slightly larger 4.8 x 4.1 x 3.0 cm left posterior subserosal fibroid is also seen. The uterus is retroverted in nature. The endometrium was thickened to 1.8 cm. Vague debris and trace fluid are seen within the lower uterine segment and cervix.    She continued to have vaginal bleeding and a repeat US was performed on 08/19/18 which showed uterine measurements: 14.5 x 10.4 x 10.9 cm. Multiple uterine fibroids are seen. The largest fibroid measuring 4.7 cm, as well as a second fibroid measuring 2.8 cm, are located centrally consistent with submucosal fibroids. The posterior subserosal fibroid is also seen measuring 4.4 cm . These show no significant change compared to prior study. Endometrium thickness could not be visualized due to acoustic shadowing from fibroids described above.  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   CT abdo/pelvis on 10/31/17 showed a few small less than 1 cm retroperitoneal lymph nodes are seen in the left paraaortic and aortocaval spaces. 10 mm right lower quadrant mesenteric lymph node is seen. No abdominal aortic aneurysm. Aortic atherosclerosis.  Enlarged  uterus measuring 19.5 cm in length. Multiple small uterine fibroids are seen, largest measuring approximately 5cm. Diffuse endometrial thickening is seen measuring 24 mm,consistent with history of known  endometrial carcinosarcoma. Poorly defined heterogeneously enhancing mass is seen in the right adnexa abutting the uterus, which measures 6.1 x 5.5 cm; suspicious for extra uterine extension of malignancy. No evidence of ascites.  On 11/24/17 she underwent ex lap TAH, BSO, omentectomy and radical tumor debulking.  She did well postoperatively however then subsequently developed severe constipation and bloody stools from her bleeding hemorrhoids.  She held the Lovenox for approximately 2 days on March 30 and April 1 however then resumed it.  She try to avoid Percocet although she does have abdominal pain from her constipation.  She had one episode of emesis on March 28 but none since that time.  She is states she is able to keep down oral agents food and liquids.  She is passing flatus.  She denies fevers.  A small amount of her wound has opened at the inferior aspect.  Labs were checked on 12/17/2017 which revealed a mildly elevated white count 13,000, anemia with a hemoglobin of 10.5, thrombocytosis at 718.  Her creatinine was substantially elevated to 2.6.  Of note her preoperative and postoperative creatinine had been stable at 1.4.  Interval Hx:  CT 12/12/08 showed progression of disease with ascties and liver mets (new) but no obstruction, no free air, no abscesses, no bleeding. Ascites drained 12/13/17 (3 L) with minimal to no pain relief. Gram stain negative.  Pain meds increased on 12/14/17 with limited relief in upper abdominal pain. Lipase normal (no evidence of pancreatitis). 12/14/17 - reported emesis (however not preceeded with nausea). Very poor appetite. Plain films showed no obstruction 12/15/17 - No additional emesis. Not passing flatus. Very little oral intake. Pain is moderately controlled.  12/16/17 - PE diagnosed 4/11 along with Afib with RVR. Transferred to stepdown. Now on drip for rate control and heparin for PE. Imaging suggesting concern for possible SBO. NGT in place. Difficult to  discern documentation of output from overnight. 12/17/17 - Stable plain film. Thinks she may have passed flatus and feels like bowels need to move. No other complaints.   Current Meds:   Current Facility-Administered Medications:  .  [EXPIRED] amiodarone (NEXTERONE PREMIX) 360-4.14 MG/200ML-% (1.8 mg/mL) IV infusion, 60 mg/hr, Intravenous, Continuous, Stopped at 12/16/17 0400 **FOLLOWED BY** amiodarone (NEXTERONE PREMIX) 360-4.14 MG/200ML-% (1.8 mg/mL) IV infusion, 30 mg/hr, Intravenous, Continuous, Elodia Florence., MD, Last Rate: 16.7 mL/hr at 12/17/17 0600, 30 mg/hr at 12/17/17 0600 .  Ampicillin-Sulbactam (UNASYN) 3 g in sodium chloride 0.9 % 100 mL IVPB, 3 g, Intravenous, Q6H, Elodia Florence., MD, Last Rate: 200 mL/hr at 12/17/17 0840, 3 g at 12/17/17 0840 .  Chlorhexidine Gluconate Cloth 2 % PADS 6 each, 6 each, Topical, Daily, Dessa Phi, DO, 6 each at 12/16/17 1016 .  dextrose 5 %-0.9 % sodium chloride infusion, , Intravenous, Continuous, Elodia Florence., MD, Last Rate: 100 mL/hr at 12/17/17 0600 .  [DISCONTINUED] diltiazem (CARDIZEM) 1 mg/mL load via infusion 10 mg, 10 mg, Intravenous, Once **AND** diltiazem (CARDIZEM) 100 mg in dextrose 5% 189mL (1 mg/mL) infusion, 5-15 mg/hr, Intravenous, Continuous, Green, Terri L, RPH, Last Rate: 10 mL/hr at 12/17/17 0800, 10 mg/hr at 12/17/17 0800 .  famotidine (PEPCID) IVPB 20 mg premix, 20 mg, Intravenous, Q12H, Dessa Phi, DO, Stopped at 12/16/17 2244 .  heparin ADULT infusion 100 units/mL (25000 units/231mL sodium chloride  0.45%), 1,600 Units/hr, Intravenous, Continuous, BellRonaldo Miyamoto, RPH, Last Rate: 16 mL/hr at 12/17/17 0800, 1,600 Units/hr at 12/17/17 0800 .  HYDROmorphone (DILAUDID) injection 1 mg, 1 mg, Intravenous, Q2H PRN, Elodia Florence., MD, 1 mg at 12/17/17 0827 .  lidocaine (XYLOCAINE) 1 % (with pres) injection, , , PRN, Allred, Darrell K, PA-C, 10 mL at 12/15/17 1141 .  LORazepam (ATIVAN) injection  0.5 mg, 0.5 mg, Intravenous, Q6H PRN, Elodia Florence., MD, 0.5 mg at 12/16/17 0806 .  [DISCONTINUED] ondansetron (ZOFRAN) tablet 4 mg, 4 mg, Oral, Q6H PRN, 4 mg at 12/10/17 0116 **OR** ondansetron (ZOFRAN) injection 4 mg, 4 mg, Intravenous, Q6H PRN, Elodia Florence., MD, 4 mg at 12/14/17 1712 .  phenol (CHLORASEPTIC) mouth spray 1 spray, 1 spray, Mouth/Throat, PRN, Dessa Phi, DO, 1 spray at 12/16/17 1411 .  sodium chloride flush (NS) 0.9 % injection 10-40 mL, 10-40 mL, Intracatheter, Q12H, Dessa Phi, DO, 10 mL at 12/16/17 1010 .  sodium chloride flush (NS) 0.9 % injection 10-40 mL, 10-40 mL, Intracatheter, PRN, Dessa Phi, DO  No facility-administered encounter medications on file as of 11/01/2017.      Labs:  CBC    Component Value Date/Time   WBC 10.4 12/17/2017 0647   RBC 3.76 (L) 12/17/2017 0647   HGB 8.5 (L) 12/17/2017 0647   HCT 28.2 (L) 12/17/2017 0647   PLT 562 (H) 12/17/2017 0647   MCV 75.0 (L) 12/17/2017 0647   MCH 22.6 (L) 12/17/2017 0647   MCHC 30.1 12/17/2017 0647   RDW 19.4 (H) 12/17/2017 0647   LYMPHSABS 1.2 12/17/2017 1443   MONOABS 1.0 (H) 12/17/2017 1443   EOSABS 0.4 12/21/2017 1443   BASOSABS 0.1 12/27/2017 1443   BMET    Component Value Date/Time   NA 141 12/17/2017 0647   NA 140 10/27/2017 1423   K 3.7 12/17/2017 0647   CL 111 12/17/2017 0647   CO2 21 (L) 12/17/2017 0647   GLUCOSE 126 (H) 12/17/2017 0647   BUN 18 12/17/2017 0647   BUN 14 10/27/2017 1423   CREATININE 1.26 (H) 12/17/2017 0647   CALCIUM 8.2 (L) 12/17/2017 0647   GFRNONAA 45 (L) 12/17/2017 0647   GFRAA 53 (L) 12/17/2017 0647     Vitals:  Blood pressure (!) 159/83, pulse 84, temperature 97.7 F (36.5 C), temperature source Oral, resp. rate 19, height 5\' 4"  (1.626 m), weight 259 lb 14.8 oz (117.9 kg), last menstrual period 04/20/2016, SpO2 96 %.  I/O last 3 completed shifts: In: 7685.8 [I.V.:6825.8; NG/GT:60; IV Piggyback:800] Out: 1400 [Urine:800; Emesis/NG  output:600] Total I/O In: 42.7 [I.V.:42.7] Out: 100 [Emesis/NG output:100]   Physical Exam: WD in NAD, in mild distress; appears more comfortable with the exception of being connected to multiple lines Lungs CTA upper field, improved aeration in bilateral bases 4/13 CV - improved. Regular rate and rhythm. Abdo - occasional BS. Soft, NT, mild distension. No rebound or guarding. Ext - minimal pitting edema. NT.   Isabel Caprice, MD  12/17/2017, 8:46 AM

## 2017-12-17 NOTE — Progress Notes (Signed)
PROGRESS NOTE    Robyn Guerra  XAJ:[id number] DOB: 1956/08/23 DOA: 12/26/2017 PCP: Lin Landsman, MD     Brief Narrative:  Robyn Guerra is a 62 y.o. female with medical history significant of HTN, iron deficiency anemia, andstage IVcarcinosarcoma of the uterus s/p ex lap, TAH, BSO, omentectomy, radical tumor debulking on 11/24/17 who represented on 4/5 with post op AKI, constipation and pain. She was seen by Dr. Alvy Bimler on 4/5 who noted acute kidney injury as well as the abdominal discomfort and concern for constipation as well.  She was readmitted by Dr. Denman George given her recent surgery. She was admitted for IV fluids and symptom management.  She had a paracentesis on 4/9 with cytology c/w metastatic adenocarcinoma.  CT abdomen pelvis from 4/8 with new R hepatic lobe lesions worrisome for metastatic disease.  Concern that cause of progressive symptoms is progressive cancer.  Dr. Alvy Bimler planning for palliative chemotherapy.  Hospitalization further complicated by pulmonary embolism, atrial fibrillation with RVR, elevated troponin, small bowel obstruction and aspiration pneumonia.  [facility] consulted on 4/11 as primary team for above issues.  Assessment & Plan:   Principal Problem:   Acute prerenal failure (HCC) Active Problems:   Endometrial cancer ([facility])   Morbid obesity with BMI of 45.0-49.9, adult (HCC)   Secondary malignant neoplasm of omentum (HCC)   Peritoneal carcinomatosis (HCC)   Iron deficiency anemia due to chronic blood loss   Wound dehiscence, surgical, initial encounter   Abdominal pain   Other constipation   Renal failure, acute ([facility])   Renal failure   Acute pulmonary embolism -CT chest showed proximal right upper lobe pulmonary embolism -Heparin gtt -Echo with reduced RV function -DVT US duplex pending  Atrial Fibrillation with RVR -Converted to normal sinus rhythm. Wean IV cardizem. If remains stable and once able to tolerate PO, will transition IV amiodarone to  PO amiodarone   Elevated Troponin -Due to demand with PE and A Fib RVR -Troponin 0.21 --> 0.06  SBO   -CT A/P showed dilated loops of proximal and mid small bowel -NGT 638mL output yesterday, 128mL output so far this morning  -Per gyn onc team, conservative management for now  -Start TPN to boost nutritional status prior to initiating chemo next week   Aspiration Pneumonia -CT chest with dilated fluid filled esophagus and L and R lower lobe consolidation concerning for aspiration pneumonitis vs pneumonia  -Blood culture negative to date  -Continue Unasyn  Stage IV carcinosarcoma of uterus s/p ex lap, TAH, BSO, omentectomy, radical tumor debulking on 3/21  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 Alvy Bimler following, PICC and palliative chemotherapy planned  -Palliative consulted for GOC and pain management  Cancer associated pain -Pain control medications prn   AKI -Baseline Cr 1.2-1.3 -IVF, improved   Chronic microcytic anemia, thrombocytosis -Monitor CBC    DVT prophylaxis: Heparin gtt Code Status: Full Family Communication: No family at bedside Disposition Plan: Pending improvement in clinical status   Consultants:   Gyn oncology  Oncology  Antimicrobials:  Anti-infectives (From admission, onward)   Start     Dose/Rate Route Frequency Ordered Stop   12/15/17 1400  Ampicillin-Sulbactam (UNASYN) 3 g in sodium chloride 0.9 % 100 mL IVPB  3 g 200 mL/hr over 30 Minutes Intravenous Every 6 hours 12/15/17 1239         Subjective: States that she is feeling better today than compared to yesterday. Abdomen feels better, although still  sore. Passed gas this morning but no BM yet. Wants to drink water or ice chips.   Objective: Vitals:   12/17/17 0400 12/17/17 0403 12/17/17 0750 12/17/17 0800  BP: (!) 143/67   (!) 159/83  Pulse: 78   84  Resp: (!) 21   19  Temp:  98.2 F (36.8 C) 97.7 F (36.5 C)   TempSrc:  Oral Oral   SpO2: 96%   96%  Weight:      Height:        Intake/Output Summary (Last 24 hours) at 12/17/2017 0840 Last data filed at 12/17/2017 0829 Gross per 24 hour  Intake 4382.58 ml  Output 1000 ml  Net 3382.58 ml   Filed Weights   12/06/2017 1704 12/16/17 0316  Weight: 110.2 kg (242 lb 15.2 oz) 117.9 kg (259 lb 14.8 oz)    Examination: General exam: Appears calm and comfortable  Respiratory system: Clear to auscultation. Respiratory effort normal. Cardiovascular system: S1 & S2 heard, RRR. No JVD, murmurs, rubs, gallops or clicks. No pedal edema. Gastrointestinal system: Abdomen is nondistended, soft and TTP. No organomegaly or masses felt. Diminished bowel sounds. +NGT  Central nervous system: Alert and oriented. No focal neurological deficits. Extremities: Symmetric 5 x 5 power. Skin: No rashes, lesions or ulcers Psychiatry: Judgement and insight appear normal. Mood & affect appropriate.    Data Reviewed: I have personally reviewed following labs and imaging studies  CBC: Recent Labs  Lab 12/13/17 0809 12/14/17 0411 12/15/17 0413 12/16/17 0619 12/17/17 0647  WBC 10.9* 11.2* 12.6* 12.3* 10.4  HGB 8.9* 9.6* 9.4* 8.3* 8.5*  HCT 28.4* 31.2* 30.6* 27.2* 28.2*  MCV 73.4* 73.9* 74.3* 74.5* 75.0*  PLT 715* 668* 714* 615* 993*   Basic Metabolic Panel: Recent Labs  Lab 12/13/17 0809 12/14/17 0411 12/15/17 0413 12/16/17 0650 12/17/17 0647  NA 136 136 140 140 141  K 3.7 4.2 4.1 4.2 3.7  CL 104 103 106 109 111  CO2 21* 20* 24 23 21*  GLUCOSE 103* 149* 159* 136* 126*  BUN 19 19 19  23* 18  CREATININE 1.31* 1.09* 1.28* 1.55* 1.26*  CALCIUM 9.0 8.8* 8.9 8.3* 8.2*   GFR: Estimated  Creatinine Clearance: 60 mL/min (A) (by C-G formula based on SCr of 1.26 mg/dL (H)). Liver Function Tests: Recent Labs  Lab 12/11/17 0421 12/12/17 0424  AST 15 15  ALT 16 17  ALKPHOS 75 75  BILITOT 0.4 0.4  PROT 7.3 7.1  ALBUMIN 2.4* 2.5*   Recent Labs  Lab 12/14/17 0411  LIPASE 16   No results for input(s): AMMONIA in the last 168 hours. Coagulation Profile: No results for input(s): INR, PROTIME in the last 168 hours. Cardiac Enzymes: Recent Labs  Lab 12/15/17 1909 12/16/17 0019 12/16/17 0619  TROPONINI 0.21* 0.11* 0.06*   BNP (last 3 results) No results for input(s): PROBNP in the last 8760 hours. HbA1C: No results for input(s): HGBA1C in the last 72 hours. CBG: No results for input(s): GLUCAP in the last 168 hours. Lipid Profile: No results for input(s): CHOL, HDL, LDLCALC, TRIG, CHOLHDL, LDLDIRECT in the last 72 hours. Thyroid Function Tests: Recent Labs    12/15/17 2241  TSH 0.474   Anemia Panel: No results for input(s): VITAMINB12, FOLATE, FERRITIN, TIBC, IRON, RETICCTPCT in  the last 72 hours. Sepsis Labs: Recent Labs  Lab 12/15/17 1546  LATICACIDVEN 1.2    Recent Results (from the past 240 hour(s))  Culture, body fluid-bottle     Status: None (Preliminary result)   Collection Time: 12/13/17 10:00 AM  Result Value Ref Range Status   Specimen Description FLUID PERITONEAL  Final   Special Requests NONE  Final   Culture   Final    NO GROWTH 3 DAYS Performed at [facility] [address], [place], [place] [address]    Report Status PENDING  Incomplete  Gram stain     Status: None   Collection Time: 12/13/17 10:00 AM  Result Value Ref Range Status   Specimen Description FLUID PERITONEAL  Final   Special Requests NONE  Final   Gram Stain   Final    FEW WBC PRESENT, PREDOMINANTLY PMN NO ORGANISMS SEEN Performed at [facility], [place] [address] [place], [place] [address]    Report Status 12/13/2017 FINAL  Final  Culture, blood  (routine x 2)     Status: None (Preliminary result)   Collection Time: 12/15/17  1:38 PM  Result Value Ref Range Status   Specimen Description   Final    BLOOD LEFT HAND Performed at [facility] [address], [place], [place] [address]    Special Requests   Final    BOTTLES DRAWN AEROBIC ONLY Blood Culture adequate volume   Culture   Final    NO GROWTH < 24 HOURS Performed at [facility], [place] [address] [place], [place] [address]    Report Status PENDING  Incomplete  Culture, blood (routine x 2)     Status: None (Preliminary result)   Collection Time: 12/15/17  1:38 PM  Result Value Ref Range Status   Specimen Description   Final    BLOOD RIGHT HAND Performed at [facility] [address], [place], [place] [address]    Special Requests   Final    BOTTLES DRAWN AEROBIC ONLY Blood Culture adequate volume   Culture   Final    NO GROWTH < 24 HOURS Performed at [facility], [place] [address] [place], [place] [address]    Report Status PENDING  Incomplete  MRSA PCR Screening     Status: None   Collection Time: 12/15/17  7:13 PM  Result Value Ref Range Status   MRSA by PCR NEGATIVE NEGATIVE Final    Comment:        The GeneXpert MRSA Assay (FDA approved for NASAL specimens only), is one component of a comprehensive MRSA colonization surveillance program. It is not intended to diagnose MRSA infection nor to guide or monitor treatment for MRSA infections. Performed at [facility], [place] [address] [place], [place] [address]        Radiology Studies: Ct Abdomen Pelvis Wo Contrast  Result Date: 12/15/2017 CLINICAL DATA:  Abdominal distension. Metastatic endometrial cancer. EXAM: CT ABDOMEN AND PELVIS WITHOUT CONTRAST TECHNIQUE: Multidetector CT imaging of the abdomen and pelvis was performed following the standard protocol without IV contrast. COMPARISON:  CT abdomen pelvis dated December 12, 2017.  FINDINGS: Lower chest: Trace bilateral pleural effusions with bilateral lower lobe consolidation as seen on CTA chest from same day. Hepatobiliary: Two low-density lesions within the right hepatic lobe measuring up to 1.5 cm are similar to prior study. Excreted contrast within the gallbladder, which is otherwise unremarkable. No biliary dilatation. Pancreas: Mild  atrophy. No ductal dilatation or surrounding inflammatory changes. Spleen: Normal in size without focal abnormality. Adrenals/Urinary Tract: The adrenal glands are unremarkable. No focal renal lesion. Contrast within the bilateral collecting systems and bladder. No hydronephrosis. Stomach/Bowel: Fluid-filled distention of the lower esophagus. New mild dilatation of multiple proximal and mid small bowel loops with a transition point likely in the mid abdomen. Oral contrast is seen within the colon. Vascular/Lymphatic: Mild aortic atherosclerosis. Stable retroperitoneal and right iliac lymphadenopathy. Reproductive: Status post hysterectomy. No adnexal masses. Other: Moderate ascites.  No pneumoperitoneum. Musculoskeletal: No acute or significant osseous findings. IMPRESSION: 1. New mildly dilated loops of proximal and mid small bowel, consistent with obstruction. Transition point is likely in the mid abdomen. 2. Moderate ascites. 3. Unchanged liver lesions and abdominal and right iliac lymphadenopathy, suspicious for metastatic disease. 4. Unchanged trace bilateral pleural effusions and bilateral lower lobe consolidation. 5.  Aortic atherosclerosis (ICD10-I70.0). Electronically Signed   By: Titus Dubin M.D.   On: 12/15/2017 19:35   Dg Abd 1 View  Result Date: 12/16/2017 CLINICAL DATA:  Follow up small bowel obstruction EXAM: ABDOMEN - 1 VIEW COMPARISON:  12/15/2017 FINDINGS: Scattered mildly dilated loops of small bowel are noted. Nasogastric catheter is noted within the stomach. Contrast material is again seen within the colon. Contrast from the  recent CT is also noted within the bladder. No bony abnormality is seen. IMPRESSION: Stable small bowel dilatation when compared with the previous exam. Electronically Signed   By: Inez Catalina M.D.   On: 12/16/2017 07:37   Dg Abd 1 View  Result Date: 12/15/2017 CLINICAL DATA:  Diffuse abdominal pain. EXAM: ABDOMEN - 1 VIEW COMPARISON:  12/14/2017. FINDINGS: IV contrast noted in the nondilated right renal collecting system/ureter/bladder. Oral contrast in the right colon. Mild persistent small bowel distention. IMPRESSION: 1. Mild persistent small bowel distention. Oral contrast again noted in the colon. No evidence of progressive bowel distention or free air. 2. IV contrast noted in a nondilated right renal collecting system/ureter, and bladder. Electronically Signed   By: Marcello Moores  Register   On: 12/15/2017 12:57   Ct Chest W Contrast  Addendum Date: 12/15/2017   ADDENDUM REPORT: 12/15/2017 15:53 ADDENDUM: In review, there is a filling defect within the proximal RIGHT upper lobe pulmonary artery consistent with acute pulmonary embolism. The lower lobe pulmonary arteries are not well evaluated due to technique as well as the lower lobe consolidation. Findings conveyed toCroft PA on 12/15/2017  at15:53. Electronically Signed   By: Suzy Bouchard M.D.   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 EXAM: CT CHEST WITH CONTRAST TECHNIQUE: Multidetector CT imaging of the chest was performed during intravenous contrast administration. CONTRAST:  42mL ISOVUE-300 IOPAMIDOL (ISOVUE-300) INJECTION 61% COMPARISON:  CT 12/12/2017, abdomen FINDINGS: Cardiovascular: No significant vascular findings. Normal heart size. No pericardial effusion. Mediastinum/Nodes: No axillary or supraclavicular adenopathy. No mediastinal adenopathy. The esophagus newly fluid-filled mildly expanded to 3 cm in diameter. Fluid extends to above the carina  and into the thoracic inlet. No obstructing lesions identified Lungs/Pleura: There is consolidation in the LEFT and RIGHT lung base with air bronchograms. Query aspiration pneumonitis. Upper lungs are clear. Upper Abdomen: Moderate peritoneal free fluid. Fluid-filled stomach. Musculoskeletal: No aggressive osseous lesion. Subcutaneous cyst in the mid line anterior chest wall superficial to the sternum is likely benign. IMPRESSION: 1. Newly dilated fluid-filled esophagus. Concern for bowel obstruction. Recommend clinical correlation and consider NG tube. 2.  LEFT and RIGHT lower lobe consolidation. Differential includes aspiration pneumonitis versus pneumonia versus less likely malignancy. 3. Ascites in the upper abdomen. Findings conveyed toFloor Nurse [place] 12/15/2017  at10:53. Electronically Signed: By: Suzy Bouchard M.D. On: 12/15/2017 10:53   Dg Abd Portable 2v  Result Date: 12/17/2017 CLINICAL DATA:  62 year old female with metastatic endometrial cancer and small-bowel obstruction on recent CT Abdomen and Pelvis. EXAM: PORTABLE ABDOMEN - 2 VIEW COMPARISON:  12/16/2017 and earlier. FINDINGS: Supine and left-side-down lateral decubitus portable views of the abdomen at 0430 hours. Enteric tube remains in place and is looped in the proximal stomach with side hole up the level of the proximal gastric body. There remains a small volume of retained oral contrast in the ascending and descending colon. No pneumoperitoneum. There are small bowel air-fluid levels in the mid abdomen estimated at 32-38 millimeters diameter which is a stable caliber to those demonstrated by CT on 12/15/2017. Left pelvic surgical clips redemonstrated. Epigastric surgical clips are stable. No acute osseous abnormality identified. Minimally visible lung bases. IMPRESSION: 1. Stable enteric tube, side hole the level of the proximal gastric body. 2. No improvement in the bowel gas pattern since 12/15/2017, compatible with small bowel  obstruction. 3. No free air in the abdomen. Electronically Signed   By: Genevie Ann M.D.   On: 12/17/2017 07:06   Ir Picc Placement Right >5 Yrs Inc Img Guide  Result Date: 12/15/2017 INDICATION: Patient with history recurrent uterine cancer ; central venous access requested for chemotherapy. EXAM: RIGHT UPPER EXTREMITY PICC LINE PLACEMENT WITH ULTRASOUND AND FLUOROSCOPIC GUIDANCE MEDICATIONS: 1% lidocaine to skin and subcutaneous tissue ANESTHESIA/SEDATION: None FLUOROSCOPY TIME:  Fluoroscopy Time: 1 minutes 2 seconds (17 mGy). COMPLICATIONS: None immediate. PROCEDURE: The patient was advised of the possible risks and complications and agreed to undergo the procedure. The patient was then brought to the angiographic suite for the procedure. The right arm was prepped with chlorhexidine, draped in the usual sterile fashion using maximum barrier technique (cap and mask, sterile gown, sterile gloves, large sterile sheet, hand hygiene and cutaneous antisepsis) and infiltrated locally with 1% Lidocaine. Ultrasound demonstrated patency of the right brachial vein, and this was documented with an image. Under real-time ultrasound guidance, this vein was accessed with a 21 gauge micropuncture needle and image documentation was performed. A 0.018 wire was introduced in to the vein. Over this, a 5 [place] double lumen power injectable PICC was advanced to the lower SVC/right atrial junction. Fluoroscopy during the procedure and fluoro spot radiograph confirms appropriate catheter position. The catheter was flushed and covered with a sterile dressing. Catheter length: 44 cm IMPRESSION: Successful right arm power PICC line placement with ultrasound and fluoroscopic guidance. The catheter is ready for use. Read by: Rowe Robert, PA-C Electronically Signed   By: Jacqulynn Cadet M.D.   On: 12/15/2017 12:25      Scheduled Meds: . Chlorhexidine Gluconate Cloth  6 each Topical Daily  . sodium chloride flush  10-40 mL  Intracatheter Q12H   Continuous Infusions: . amiodarone 30 mg/hr (12/17/17 0600)  . ampicillin-sulbactam (UNASYN) IV Stopped (12/17/17 0259)  . dextrose 5 % and 0.9% NaCl 100 mL/hr at 12/17/17 0600  . diltiazem (CARDIZEM) infusion 10 mg/hr (12/17/17 0600)  . famotidine (PEPCID) IV Stopped (12/16/17 2244)  . heparin 1,600 Units/hr (12/17/17 0600)     LOS: 5 days    Time spent: 20 minutes   Dessa Phi, DO [facility] [email or web address] Password [email or web address] 12/17/2017, 8:40 AM

## 2017-12-17 NOTE — Progress Notes (Signed)
Pharmacy Note re: IV Heparin for PE  O: Heparin level 0.4 on 1600 units/hr (goal 0.3-0.7)     No bleeding or infusion related issues reported by RN  A/P:  Heparin remains therapeutic.  Continue current rate.            Daily heparin level & CBC while on heparin.  Netta Cedars, PharmD, BCPS 12/17/2017@4 :49 PM

## 2017-12-17 NOTE — Consult Note (Signed)
Consultation Note Date: 12/17/2017   Patient Name: Robyn Guerra  DOB: 1956-05-15  MRN: [id number]  Age / Sex: 62 y.o., female  PCP: Lin Landsman, MD Referring Physician: Dessa Phi, DO  Reason for Consultation: Establishing goals of care and Pain control  HPI/Patient Profile: 62 y.o. female  with past medical history of recurrent uterine cancer status post ex lap, TAH, BSO, and tumor debulking on 3/21 with malignant ascites and liver metastases admitted on 12/13/2017 with uncontrolled pain and acute renal failure.  Her hospital course has been further complicated by pulmonary embolus, A. fib with RVR, elevated troponin, aspiration pneumonia, and small bowel obstruction.  Palliative consulted for pain management and goals of care.  Clinical Assessment and Goals of Care: I met today with Robyn Guerra in her room.  She reports the most important things to her are living as well as but she can for as long as she can and her family.  She reports the doctors have been doing a good job explaining things to her and she understands that she has multiple issues going on at this time.  She currently has NG tube in place and reports this is been very helpful in diminishing her abdominal pain.  She reports her pain is sharp in nature and gets as high as an 8 out of 10.  The lowest has been over the past several days has been a 3 out of 10.  She currently reports a 5 out of 10.  She reports the pain intermittently exacerbates without cause and is partially relieved by pain medication.  The most effective thing she is received an IV Dilaudid which she had several hours ago.  Since this point time her pain is been 5 out of 10 which she reports is tolerable.  We discussed that with her being n.p.o., she would likely best be served by continuation of current regimen of Dilaudid 1 mg IV every 2 hours as needed.  She is in  agreement this plan.  We also discussed that at some point time we will need to work on getting her back on oral medications if her bowel function does return.  We talked a little about advance care planning.  She is not in a place to rediscuss long-term goals of care at this time as she is focused only on feeling better and getting through the weekend to fully begin chemotherapy treatments next week.  We did discuss that she has a husband who is out of the [place] and she does not feel that he would be the appropriate surrogate decision maker if she cannot make her own decisions.  She states that she wants to name her sister as her surrogate.  SUMMARY OF RECOMMENDATIONS   -Full code.  Full scope treatment -She would like to name her sister is her surrogate decision maker.  I placed a consult to spiritual care and appreciate their assistance in facilitating this and also emotionally supporting Robyn Guerra as she is appropriately upset by her decline over the  past several days. -For pain, recommend continue with intermittent IV pain medications at this time.  Hopefully her small bowel obstruction will resolve and we can work on transitioning back to effective oral regimen at that time.  Code Status/Advance Care Planning:  Full code   Symptom Management:   As above  Palliative Prophylaxis:   Frequent Pain Assessment  Additional Recommendations (Limitations, Scope, Preferences):  Full Scope Treatment  Psycho-social/Spiritual:   Desire for further Chaplaincy support:yes  Additional Recommendations: Caregiving  Support/Resources  Prognosis:   Unable to determine  Discharge Planning: To Be Determined      Primary Diagnoses: Present on Admission: . 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 Endometrial cancer ([place]) . Peritoneal carcinomatosis ([place]) . Iron deficiency anemia due to chronic blood loss . Wound dehiscence, surgical, initial encounter . Acute prerenal failure  ([place]) . Abdominal pain . Other constipation . Renal failure, acute ([place]) . Renal failure   I have reviewed the medical record, interviewed the patient and family, and examined the patient. The following aspects are pertinent.  Past Medical History:  Diagnosis Date  . Cancer [facility])    endometrial cancer  . Fibroids   . GERD (gastroesophageal reflux disease)   . Hypertension   . Sinusitis    Social History   Socioeconomic History  . Marital status: Married    Spouse name: Not on file  . Number of children: 0  . Years of education: Not on file  . Highest education level: Not on file  Occupational History  . Occupation: unemployed  Social Needs  . Financial resource strain: Not on file  . Food insecurity:    Worry: Not on file    Inability: Not on file  . Transportation needs:    Medical: Not on file    Non-medical: Not on file  Tobacco Use  . Smoking status: Former Smoker    Years: 4.00    Types: Cigarettes    Last attempt to quit: 10/18/1978    Years since quitting: 39.1  . Smokeless tobacco: Never Used  . Tobacco comment: smoked cigarrettes when she was 37 to age 58 then quit.   Substance and Sexual Activity  . Alcohol use: Yes    Comment: rarely  . Drug use: No  . Sexual activity: Not on file  Lifestyle  . Physical activity:    Days per week: Not on file    Minutes per session: Not on file  . Stress: Not on file  Relationships  . Social connections:    Talks on phone: Not on file    Gets together: Not on file    Attends religious service: Not on file    Active member of club or organization: Not on file    Attends meetings of clubs or organizations: Not on file    Relationship status: Not on file  Other Topics Concern  . Not on file  Social History Narrative  . Not on file   Family History  Problem Relation Age of Onset  . Diabetes Mother   . Prostate cancer Brother 47  . Colon cancer Paternal Grandmother   . Prostate cancer Brother 32    Scheduled Meds: . Chlorhexidine Gluconate Cloth  6 each Topical Daily  . sodium chloride flush  10-40 mL Intracatheter Q12H   Continuous Infusions: . amiodarone 30 mg/hr (12/17/17 0600)  . ampicillin-sulbactam (UNASYN) IV 3 g (12/17/17 0840)  . dextrose 5 % and 0.9% NaCl 100 mL/hr at 12/17/17 0600  .  diltiazem (CARDIZEM) infusion 10 mg/hr (12/17/17 0800)  . famotidine (PEPCID) IV Stopped (12/16/17 2244)  . heparin 1,600 Units/hr (12/17/17 0800)   PRN Meds:.HYDROmorphone (DILAUDID) injection, lidocaine, LORazepam, [DISCONTINUED] ondansetron **OR** ondansetron (ZOFRAN) IV, phenol, sodium chloride flush Medications Prior to Admission:  Prior to Admission medications   Medication Sig Start Date End Date Taking? Authorizing Provider  acetaminophen (TYLENOL) 500 MG tablet Take 500 mg by mouth every 6 (six) hours as needed for moderate pain.   Yes [provider]  BIOTIN PO Take 1 tablet by mouth daily.   Yes [provider]  docusate sodium (COLACE) 100 MG capsule Take 1 capsule (100 mg total) by mouth 2 (two) times daily. 12/08/17  Yes Cross, Melissa D, NP  enoxaparin (LOVENOX) 40 MG/0.4ML injection Inject 0.4 mLs (40 mg total) into the skin daily for 25 days. 11/28/17 12/23/17 Yes [name], MD  hydrochlorothiazide (HYDRODIURIL) 25 MG tablet Take 1 tablet (25 mg total) by mouth daily. 09/26/17  Yes Lavonia Drafts, MD  ibuprofen (ADVIL,MOTRIN) 200 MG tablet Take 400 mg by mouth every 6 (six) hours as needed for moderate pain.   Yes [provider]  oxyCODONE-acetaminophen (PERCOCET/ROXICET) 5-325 MG tablet Take 1-2 tablets by mouth every 4 (four) hours as needed for severe pain. 12/08/17  Yes Cross, Melissa D, NP  polyethylene glycol (MIRALAX / GLYCOLAX) packet Take 17 g by mouth 2 (two) times daily. 12/08/17  Yes Cross, Melissa D, NP  Wheat Dextrin (BENEFIBER DRINK MIX PO) Take 30 mLs by mouth once.   Yes [provider]   diphenhydramine-acetaminophen (TYLENOL PM) 25-500 MG TABS tablet Take 1 tablet by mouth at bedtime as needed (sleep).    [provider]  ibuprofen (ADVIL,MOTRIN) 600 MG tablet Take 1 tablet (600 mg total) by mouth every 6 (six) hours as needed. Patient not taking: Reported on 12/28/2017 11/01/17   [name], NP   Allergies  Allergen Reactions  . Codeine Nausea And Vomiting    Room spins   Review of Systems  Constitutional: Positive for activity change, appetite change and fatigue.  Gastrointestinal: Positive for abdominal distention, abdominal pain and constipation.  Musculoskeletal: Positive for back pain and neck pain.  Neurological: Positive for weakness.  Psychiatric/Behavioral: The patient is nervous/anxious.     Physical Exam  General: Alert, awake, in no acute distress. NGT in place HEENT: No bruits, no goiter, no JVD Heart: irregular. No murmur appreciated. Lungs: Good air movement, clear Abdomen: Soft, nontender, nondistended, positive bowel sounds.  Ext: No significant edema Skin: Warm and dry Neuro: Grossly intact, nonfocal.  Vital Signs: BP (!) 159/83   Pulse 84   Temp 97.7 F (36.5 C) (Oral)   Resp 19   Ht _0  (1.626 m)   Wt 117.9 kg (259 lb 14.8 oz)   LMP 04/20/2016   SpO2 96%   BMI 44.62 kg/m  Pain Scale: 0-10 POSS *See Group Information*: 1-Acceptable,Awake and alert Pain Score: 3    SpO2: SpO2: 96 % O2 Device:SpO2: 96 % O2 Flow Rate: .O2 Flow Rate (L/min): 6 L/min  IO: Intake/output summary:   Intake/Output Summary (Last 24 hours) at 12/17/2017 0903 Last data filed at 12/17/2017 0829 Gross per 24 hour  Intake 4524.78 ml  Output 1000 ml  Net 3524.78 ml    LBM: Last BM Date: 12/11/17 Baseline Weight: Weight: 110.2 kg (242 lb 15.2 oz) Most recent weight: Weight: 117.9 kg (259 lb 14.8 oz)     Palliative Assessment/Data:   Flowsheet Rows  Most Recent Value  Intake Tab  Referral Department  Hospitalist  Unit at Time of  Referral  ICU  Palliative Care Primary Diagnosis  Cancer  Date Notified  12/15/17  Palliative Care Type  New Palliative care  Reason for referral  Clarify Goals of Care, Pain  Date of Admission  12/06/2017  Date first seen by Palliative Care  12/16/17  # of days Palliative referral response time  1 Day(s)  # of days IP prior to Palliative referral  6  Clinical Assessment  Palliative Performance Scale Score  50%  Pain Max last 24 hours  8  Pain Min Last 24 hours  3  Psychosocial & Spiritual Assessment  Palliative Care Outcomes  Patient/Family meeting held?  Yes  Palliative Care Outcomes  Clarified goals of care      Time In: 1215 Time Out: 1330 Time Total: 75 Greater than 50%  of this time was spent counseling and coordinating care related to the above assessment and plan.  Signed by: Micheline Rough, MD   Please contact Palliative Medicine Team phone at [phone] for questions and concerns.  For individual provider: See Shea Evans

## 2017-12-18 ENCOUNTER — Inpatient Hospital Stay (HOSPITAL_COMMUNITY): Payer: BLUE CROSS/BLUE SHIELD

## 2017-12-18 DIAGNOSIS — E440 Moderate protein-calorie malnutrition: Secondary | ICD-10-CM

## 2017-12-18 LAB — CULTURE, BODY FLUID W GRAM STAIN -BOTTLE: Culture: NO GROWTH

## 2017-12-18 LAB — HEPARIN LEVEL (UNFRACTIONATED): HEPARIN UNFRACTIONATED: 0.54 [IU]/mL (ref 0.30–0.70)

## 2017-12-18 LAB — CBC
HCT: 28.1 % — ABNORMAL LOW (ref 36.0–46.0)
HEMOGLOBIN: 8.5 g/dL — AB (ref 12.0–15.0)
MCH: 22.7 pg — AB (ref 26.0–34.0)
MCHC: 30.2 g/dL (ref 30.0–36.0)
MCV: 74.9 fL — ABNORMAL LOW (ref 78.0–100.0)
Platelets: 581 10*3/uL — ABNORMAL HIGH (ref 150–400)
RBC: 3.75 MIL/uL — ABNORMAL LOW (ref 3.87–5.11)
RDW: 19.1 % — AB (ref 11.5–15.5)
WBC: 10.9 10*3/uL — ABNORMAL HIGH (ref 4.0–10.5)

## 2017-12-18 LAB — BASIC METABOLIC PANEL
ANION GAP: 10 (ref 5–15)
BUN: 13 mg/dL (ref 6–20)
CO2: 22 mmol/L (ref 22–32)
CREATININE: 1.24 mg/dL — AB (ref 0.44–1.00)
Calcium: 8.1 mg/dL — ABNORMAL LOW (ref 8.9–10.3)
Chloride: 110 mmol/L (ref 101–111)
GFR calc Af Amer: 54 mL/min — ABNORMAL LOW (ref >60)
GFR, EST NON AFRICAN AMERICAN: 46 mL/min — AB (ref >60)
GLUCOSE: 119 mg/dL — AB (ref 65–99)
Potassium: 3.6 mmol/L (ref 3.5–5.1)
Sodium: 142 mmol/L (ref 135–145)

## 2017-12-18 NOTE — Progress Notes (Signed)
Patient has no residual from NGT at this time.

## 2017-12-18 NOTE — Progress Notes (Signed)
Nutrition Follow-up  DOCUMENTATION CODES:   Morbid obesity, Non-severe (moderate) malnutrition in context of acute illness/injury  INTERVENTION:   Monitor magnesium, potassium, and phosphorus daily for at least 3 days, MD to replete as needed, as pt is at risk for refeeding syndrome given poor PO intake for ~2.5 months and significant weight loss.  TPN per Pharmacy  NUTRITION DIAGNOSIS:   Inadequate oral intake related to constipation, poor appetite as evidenced by per patient/family report.  Ongoing.  GOAL:   Patient will meet greater than or equal to 90% of their needs  Progressing.  MONITOR:   PO intake, Supplement acceptance, Labs, Weight trends, I & O's  REASON FOR ASSESSMENT:   Consult New TPN/TNA  ASSESSMENT:   62 y.o.  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 She also has acute blood loss anemia.  Patient expected to start chemotherapy this week per oncology note. NGT placed, pt now with bowel obstruction, will be clamped today. Pt had a BM yesterday. Once PICC placed, TPN to begin. Would monitor for refeeding syndrome given pt's prolonged poor PO intake.  RD can now diagnose with moderate malnutrition in the acute setting given poor PO intakes and significant weight loss.  Medications: D5 -.9% NaCl infusion at 100 ml/hr -provides 408 kcal  Labs reviewed: GFR: 54  Diet Order:  Diet NPO time specified  EDUCATION NEEDS:   No education needs have been identified at this time  Skin:  Skin Assessment: Reviewed RN Assessment  Last BM:  4/13  Height:   Ht Readings from Last 1 Encounters:  12/11/2017 5\' 4"  (1.626 m)    Weight:   Wt Readings from Last 1 Encounters:  12/16/17 259 lb 14.8 oz (117.9 kg)    Ideal Body Weight:  54.5 kg  BMI:  Body mass index is 44.62 kg/m.  Estimated Nutritional Needs:   Kcal:   1700-1900  Protein:  70-80g  Fluid:  1.7L/day  Clayton Bibles, MS, RD, LDN [place] Dietitian Pager: [phone] After Hours Pager: [phone]

## 2017-12-18 NOTE — Progress Notes (Signed)
PROGRESS NOTE    Robyn Guerra  TOI:[id number] DOB: April 13, 1956 DOA: 12/25/2017 PCP: Lin Landsman, MD     Brief Narrative:  Robyn Guerra is a 62 y.o. female with medical history significant of HTN, iron deficiency anemia, andstage IVcarcinosarcoma of the uterus s/p ex lap, TAH, BSO, omentectomy, radical tumor debulking on 11/24/17 who represented on 4/5 with post op AKI, constipation and pain. She was seen by Dr. Alvy Bimler on 4/5 who noted acute kidney injury as well as the abdominal discomfort and concern for constipation as well.  She was readmitted by Dr. Denman George given her recent surgery. She was admitted for IV fluids and symptom management.  She had a paracentesis on 4/9 with cytology c/w metastatic adenocarcinoma.  CT abdomen pelvis from 4/8 with new R hepatic lobe lesions worrisome for metastatic disease.  Concern that cause of progressive symptoms is progressive cancer.  Dr. Alvy Bimler planning for palliative chemotherapy.  Hospitalization further complicated by pulmonary embolism, atrial fibrillation with RVR, elevated troponin, small bowel obstruction and aspiration pneumonia.  [facility] consulted on 4/11 as primary team for above issues.  Assessment & Plan:   Principal Problem:   Acute prerenal failure (HCC) Active Problems:   Endometrial cancer ([facility])   Morbid obesity with BMI of 45.0-49.9, adult (HCC)   Secondary malignant neoplasm of omentum (HCC)   Peritoneal carcinomatosis (HCC)   Iron deficiency anemia due to chronic blood loss   Wound dehiscence, surgical, initial encounter   Abdominal pain   Other constipation   Renal failure, acute ([place])   Renal failure   Acute pulmonary embolism -CT chest showed proximal right upper lobe pulmonary embolism -Echo with reduced RV function -DVT US duplex negative for DVT -Heparin gtt, plan to switch to DOAC once SOB resolves and able to tolerate PO without surgical intervention planned   Atrial Fibrillation with RVR -Converted to normal  sinus rhythm. Stop IV cardizem. If remains stable and once able to tolerate PO, will transition IV amiodarone to PO amiodarone   Elevated Troponin -Due to demand with PE and A Fib RVR -Troponin 0.21 --> 0.06  SBO   -CT A/P showed dilated loops of proximal and mid small bowel -NGT 270mL output yesterday, had BM yesterday as well  -Per gyn onc team, conservative management for now  -Start TPN to boost nutritional status prior to initiating chemo next week. Dietitian consulted 4/13 -Plan to clamp NGT today   Aspiration Pneumonia -CT chest with dilated fluid filled esophagus and L and R lower lobe consolidation concerning for aspiration pneumonitis vs pneumonia  -Blood culture negative to date  -Continue Unasyn  Stage IV carcinosarcoma of uterus s/p ex lap, TAH, BSO, omentectomy, radical tumor debulking on 3/21  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 Alvy Bimler following, PICC and palliative chemotherapy planned  -Palliative consulted for [place] and pain management  Cancer associated pain -Pain control medications prn   AKI -Baseline Cr 1.2-1.3 -IVF, resolved, back to baseline   Chronic microcytic anemia, thrombocytosis -Monitor CBC    DVT prophylaxis: Heparin gtt Code Status: Full Family Communication: No family at bedside Disposition Plan: Pending improvement in clinical status   Consultants:   Gyn oncology  Oncology  Antimicrobials:  Anti-infectives (From admission, onward)  Start     Dose/Rate Route Frequency Ordered Stop   12/15/17 1400  Ampicillin-Sulbactam (UNASYN) 3 g in sodium chloride 0.9 % 100 mL IVPB     3 g 200 mL/hr over 30  Minutes Intravenous Every 6 hours 12/15/17 1239         Subjective: Abdomen continues to be sore, has been passing gas and had a good bowel movement yesterday.  Objective: Vitals:   12/18/17 0400 12/18/17 0800 12/18/17 0900 12/18/17 1000  BP: 136/72 (!) 141/74 (!) 137/100 134/89  Pulse: 84 87 93 88  Resp: 18 (!) 22 (!) 26 (!) 22  Temp:  98.4 F (36.9 C)    TempSrc:  Oral    SpO2: 98% 98% 99% 100%  Weight:      Height:        Intake/Output Summary (Last 24 hours) at 12/18/2017 1207 Last data filed at 12/18/2017 0900 Gross per 24 hour  Intake 3489.7 ml  Output 200 ml  Net 3289.7 ml   Filed Weights   12/24/2017 1704 12/16/17 0316  Weight: 110.2 kg (242 lb 15.2 oz) 117.9 kg (259 lb 14.8 oz)   Examination: General exam: Appears calm and comfortable  Respiratory system: Clear to auscultation. Respiratory effort normal. Cardiovascular system: S1 & S2 heard, RRR. No JVD, murmurs, rubs, gallops or clicks. No pedal edema. Gastrointestinal system: Abdomen is nondistended, soft and TTP. No organomegaly or masses felt. No bowel sounds heard. +NGT Central nervous system: Alert and oriented. No focal neurological deficits. Extremities: Symmetric 5 x 5 power. Skin: No rashes, lesions or ulcers Psychiatry: Judgement and insight appear normal. Mood & affect appropriate.    Data Reviewed: I have personally reviewed following labs and imaging studies  CBC: Recent Labs  Lab 12/14/17 0411 12/15/17 0413 12/16/17 0619 12/17/17 0647 12/18/17 0500  WBC 11.2* 12.6* 12.3* 10.4 10.9*  HGB 9.6* 9.4* 8.3* 8.5* 8.5*  HCT 31.2* 30.6* 27.2* 28.2* 28.1*  MCV 73.9* 74.3* 74.5* 75.0* 74.9*  PLT 668* 714* 615* 562* 951*   Basic Metabolic Panel: Recent Labs  Lab 12/14/17 0411 12/15/17 0413 12/16/17 0650 12/17/17 0647 12/18/17 0500  NA 136 140 140 141 142  K 4.2 4.1 4.2 3.7 3.6  CL 103 106 109 111 110  CO2 20* 24 23 21* 22  GLUCOSE 149* 159* 136* 126* 119*  BUN 19 19 23* 18 13    CREATININE 1.09* 1.28* 1.55* 1.26* 1.24*  CALCIUM 8.8* 8.9 8.3* 8.2* 8.1*   GFR: Estimated Creatinine Clearance: 60.9 mL/min (A) (by C-G formula based on SCr of 1.24 mg/dL (H)). Liver Function Tests: Recent Labs  Lab 12/12/17 0424  AST 15  ALT 17  ALKPHOS 75  BILITOT 0.4  PROT 7.1  ALBUMIN 2.5*   Recent Labs  Lab 12/14/17 0411  LIPASE 16   No results for input(s): AMMONIA in the last 168 hours. Coagulation Profile: No results for input(s): INR, PROTIME in the last 168 hours. Cardiac Enzymes: Recent Labs  Lab 12/15/17 1909 12/16/17 0019 12/16/17 0619  TROPONINI 0.21* 0.11* 0.06*   BNP (last 3 results) No results for input(s): PROBNP in the last 8760 hours. HbA1C: No results for input(s): HGBA1C in the last 72 hours. CBG: No results for input(s): GLUCAP in the last 168 hours. Lipid Profile: No results for input(s): CHOL, HDL, LDLCALC, TRIG, CHOLHDL, LDLDIRECT in the last 72 hours. Thyroid Function Tests: Recent Labs    12/15/17 2241  TSH 0.474   Anemia Panel: No results for input(s): VITAMINB12, FOLATE,  FERRITIN, TIBC, IRON, RETICCTPCT in the last 72 hours. Sepsis Labs: Recent Labs  Lab 12/15/17 1546  LATICACIDVEN 1.2    Recent Results (from the past 240 hour(s))  Culture, body fluid-bottle     Status: None   Collection Time: 12/13/17 10:00 AM  Result Value Ref Range Status   Specimen Description FLUID PERITONEAL  Final   Special Requests NONE  Final   Culture   Final    NO GROWTH 5 DAYS Performed at [facility] [address], [place], [place] [address]    Report Status 12/18/2017 FINAL  Final  Gram stain     Status: None   Collection Time: 12/13/17 10:00 AM  Result Value Ref Range Status   Specimen Description FLUID PERITONEAL  Final   Special Requests NONE  Final   Gram Stain   Final    FEW WBC PRESENT, PREDOMINANTLY PMN NO ORGANISMS SEEN Performed at [facility], [place] [address], [place], [place] [address]    Report Status  12/13/2017 FINAL  Final  Culture, blood (routine x 2)     Status: None (Preliminary result)   Collection Time: 12/15/17  1:38 PM  Result Value Ref Range Status   Specimen Description   Final    BLOOD LEFT HAND Performed at [facility] [address] [place], [place] [address]    Special Requests   Final    BOTTLES DRAWN AEROBIC ONLY Blood Culture adequate volume   Culture   Final    NO GROWTH 3 DAYS Performed at [facility], [place] [address], [place], [place] [address]    Report Status PENDING  Incomplete  Culture, blood (routine x 2)     Status: None (Preliminary result)   Collection Time: 12/15/17  1:38 PM  Result Value Ref Range Status   Specimen Description   Final    BLOOD RIGHT HAND Performed at [facility] [address] [place], [place] [address]    Special Requests   Final    BOTTLES DRAWN AEROBIC ONLY Blood Culture adequate volume   Culture   Final    NO GROWTH 3 DAYS Performed at [facility], [place] [address], [place], [place] [address]    Report Status PENDING  Incomplete  MRSA PCR Screening     Status: None   Collection Time: 12/15/17  7:13 PM  Result Value Ref Range Status   MRSA by PCR NEGATIVE NEGATIVE Final    Comment:        The GeneXpert MRSA Assay (FDA approved for NASAL specimens only), is one component of a comprehensive MRSA colonization surveillance program. It is not intended to diagnose MRSA infection nor to guide or monitor treatment for MRSA infections. Performed at [facility], [place] [address] [place], [place] [address]        Radiology Studies: Dg Abd Portable 1v  Result Date: 12/18/2017 CLINICAL DATA:  Small bowel obstruction. EXAM: PORTABLE ABDOMEN - 1 VIEW COMPARISON:  12/17/2017 FINDINGS: Nasogastric tube remains in place with tip in the distal gastric body. Oral contrast material persists throughout the colon. Several mildly dilated small bowel loops are  again seen with the left abdomen. Surgical clips noted in the left pelvis. IMPRESSION: No significant change in several mildly dilated small bowel loops in left abdomen. Oral contrast material again seen throughout colon. Electronically Signed   By: Earle Gell M.D.   On: 12/18/2017 07:10   Dg Abd  Portable 2v  Result Date: 12/17/2017 CLINICAL DATA:  62 year old female with metastatic endometrial cancer and small-bowel obstruction on recent CT Abdomen and Pelvis. EXAM: PORTABLE ABDOMEN - 2 VIEW COMPARISON:  12/16/2017 and earlier. FINDINGS: Supine and left-side-down lateral decubitus portable views of the abdomen at 0430 hours. Enteric tube remains in place and is looped in the proximal stomach with side hole up the level of the proximal gastric body. There remains a small volume of retained oral contrast in the ascending and descending colon. No pneumoperitoneum. There are small bowel air-fluid levels in the mid abdomen estimated at 32-38 millimeters diameter which is a stable caliber to those demonstrated by CT on 12/15/2017. Left pelvic surgical clips redemonstrated. Epigastric surgical clips are stable. No acute osseous abnormality identified. Minimally visible lung bases. IMPRESSION: 1. Stable enteric tube, side hole the level of the proximal gastric body. 2. No improvement in the bowel gas pattern since 12/15/2017, compatible with small bowel obstruction. 3. No free air in the abdomen. Electronically Signed   By: Genevie Ann M.D.   On: 12/17/2017 07:06      Scheduled Meds: . Chlorhexidine Gluconate Cloth  6 each Topical Daily  . sodium chloride flush  10-40 mL Intracatheter Q12H   Continuous Infusions: . amiodarone 30 mg/hr (12/18/17 1111)  . ampicillin-sulbactam (UNASYN) IV Stopped (12/18/17 0825)  . dextrose 5 % and 0.9% NaCl 100 mL/hr at 12/18/17 0900  . diltiazem (CARDIZEM) infusion 5 mg/hr (12/18/17 0900)  . famotidine (PEPCID) IV 20 mg (12/18/17 1111)  . heparin 1,600 Units/hr (12/18/17  0900)     LOS: 6 days    Time spent: 20 minutes   Dessa Phi, DO [facility] [email or web address] Password [facility] 12/18/2017, 12:07 PM

## 2017-12-18 NOTE — Progress Notes (Signed)
Inpatient Progress Note: Gyn-Onc  CC:  1. Abdominal pain and AKI on admit 2. Stage IV uterine cancer; suspected rapid progression with reaccumulation of ascites 3. Pulm embolus 12/15/17 4. Afib with RVR 12/15/17 5. Ileus with question of SBO 12/14/17-present 6. Aspiration PNA versus other  Assessment/Plan:  Robyn. Robyn Guerra  is a 62 y.o.  year old with stage IV carcinosarcoma of the uterus s/p ex lap, TAH, BSO, omentectomy, radical tumor debulking on 11/24/17 now readmitted with postop AKI, and pain.   AKI: baseline creatinine elevated at 1.4 . Holding nephrotoxic agents as able. Monitor.  Anemia: Hb stable. No signs of active bleeding. Defer to Dr. Alvy Bimler.  Ascites: secondary to progressive stage IV uterine carcinosarcoma. S/p paracentesis with limited pain relief. Consider repeat paracentesis if remains symptomatic.  Abdominal pain:  CT abd/pelvis on 12/12/17 to evaluate for occult source of intra-abdominal pathology causing pain did not show apparent source (other than ascites).  Clinically no sign of infection (eg abscess or perforation)  and normal labs and vital signs therefore the suspicion for postop pathologic intraabdominal process is low. Ascites tap with no growth and negative gram stain, some pain relief with initial paracentesis. Lipase normal - no pancreatitis. Lactic acid - normal prior to PE/Afib episode Plain film 4/10 showed no significant obstruction, however has had ileus starting 4/10 Pain improved since admission    Ileus versus partial obstruction. Initial contrast from CT 4/8 in colon. No worsening on plain films. NGT is in place. Recommend conservative management for now with NGT/IVF and complete bowel rest. PLAN to clamp NGT today (4/14) and if low residual will allow ice chips/med with sips. GI prophylaxis in place Consider TPN given prolonged malnutrition and possible chemo next week.-- Defer to primary team and Dr. Alvy Bimler.   Pulm ?PNA versus  aspiration on chest CT. Primary team has her on Unasyn.    HPI: Robyn Guerra is a 62 year old nulliparous woman who is seen in consultation at the request of Dr Ihor Dow for high grade endometrial cancer, fibroid uterus.  The patient reports vaginal bleeding for since December, 2017. It is daily but light. She was evaluated with a TVUS on 10/03/16 which showed a uterus with measurements: 14.4 x 5.8 x 9.7 cm. There appears to be a 3.8 x 3.2 x 3.0 cm right posterior submucosal fibroid abutting the endometrial canal. A slightly larger 4.8 x 4.1 x 3.0 cm left posterior subserosal fibroid is also seen. The uterus is retroverted in nature. The endometrium was thickened to 1.8 cm. Vague debris and trace fluid are seen within the lower uterine segment and cervix.    She continued to have vaginal bleeding and a repeat US was performed on 08/19/18 which showed uterine measurements: 14.5 x 10.4 x 10.9 cm. Multiple uterine fibroids are seen. The largest fibroid measuring 4.7 cm, as well as a second fibroid measuring 2.8 cm, are located centrally consistent with submucosal fibroids. The posterior subserosal fibroid is also seen measuring 4.4 cm . These show no significant change compared to prior study. Endometrium thickness could not be visualized due to acoustic shadowing from fibroids described above.  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   CT abdo/pelvis on 10/31/17 showed a few small less than 1 cm retroperitoneal lymph nodes are seen in the left paraaortic and  aortocaval spaces. 10 mm right lower quadrant mesenteric lymph node is seen. No abdominal aortic aneurysm. Aortic atherosclerosis.  Enlarged uterus measuring 19.5 cm in length. Multiple small uterine fibroids are seen, largest measuring  approximately 5cm. Diffuse endometrial thickening is seen measuring 24 mm,consistent with history of known endometrial carcinosarcoma. Poorly defined heterogeneously enhancing mass is seen in the right adnexa abutting the uterus, which measures 6.1 x 5.5 cm; suspicious for extra uterine extension of malignancy. No evidence of ascites.  On 11/24/17 she underwent ex lap TAH, BSO, omentectomy and radical tumor debulking.  She did well postoperatively however then subsequently developed severe constipation and bloody stools from her bleeding hemorrhoids.  She held the Lovenox for approximately 2 days on March 30 and April 1 however then resumed it.  She try to avoid Percocet although she does have abdominal pain from her constipation.  She had one episode of emesis on March 28 but none since that time.  She is states she is able to keep down oral agents food and liquids.  She is passing flatus.  She denies fevers.  A small amount of her wound has opened at the inferior aspect.  Labs were checked on 01/03/2018 which revealed a mildly elevated white count 13,000, anemia with a hemoglobin of 10.5, thrombocytosis at 718.  Her creatinine was substantially elevated to 2.6.  Of note her preoperative and postoperative creatinine had been stable at 1.4.  Interval Hx:  CT 12/12/08 showed progression of disease with ascties and liver mets (new) but no obstruction, no free air, no abscesses, no bleeding. Ascites drained 12/13/17 (3 L) with minimal to no pain relief. Gram stain negative.  Pain meds increased on 12/14/17 with limited relief in upper abdominal pain. Lipase normal (no evidence of pancreatitis). 12/14/17 - reported emesis (however not preceeded with nausea). Very poor appetite. Plain films showed no obstruction 12/15/17 - No additional emesis. Not passing flatus. Very little oral intake. Pain is moderately controlled.  12/16/17 - PE diagnosed 4/11 along with Afib with RVR. Transferred to stepdown. Now on drip for rate  control and heparin for PE. Imaging suggesting concern for possible SBO. NGT in place. Difficult to discern documentation of output from overnight. 12/17/17 - Stable plain film. Thinks she may have passed flatus and feels like bowels need to move. No other complaints. 12/18/17 - Had soft BM with flatus. Some urine was unaccounted for (spilled to the chuck) which explains output documentation.   Current Meds:   Current Facility-Administered Medications:  .  [EXPIRED] amiodarone (NEXTERONE PREMIX) 360-4.14 MG/200ML-% (1.8 mg/mL) IV infusion, 60 mg/hr, Intravenous, Continuous, Stopped at 12/16/17 0400 **FOLLOWED BY** amiodarone (NEXTERONE PREMIX) 360-4.14 MG/200ML-% (1.8 mg/mL) IV infusion, 30 mg/hr, Intravenous, Continuous, Elodia Florence., MD, Last Rate: 16.7 mL/hr at 12/18/17 0900, 30 mg/hr at 12/18/17 0900 .  Ampicillin-Sulbactam (UNASYN) 3 g in sodium chloride 0.9 % 100 mL IVPB, 3 g, Intravenous, Q6H, Elodia Florence., MD, Stopped at 12/18/17 0825 .  Chlorhexidine Gluconate Cloth 2 % PADS 6 each, 6 each, Topical, Daily, Dessa Phi, DO, 6 each at 12/17/17 1046 .  dextrose 5 %-0.9 % sodium chloride infusion, , Intravenous, Continuous, Elodia Florence., MD, Last Rate: 100 mL/hr at 12/18/17 0900 .  [DISCONTINUED] diltiazem (CARDIZEM) 1 mg/mL load via infusion 10 mg, 10 mg, Intravenous, Once **AND** diltiazem (CARDIZEM) 100 mg in dextrose 5% 123mL (1 mg/mL) infusion, 5-15 mg/hr, Intravenous, Continuous, Green, Terri L, RPH, Last Rate: 5 mL/hr at 12/18/17 0900, 5 mg/hr at  12/18/17 0900 .  famotidine (PEPCID) IVPB 20 mg premix, 20 mg, Intravenous, Q12H, Dessa Phi, DO, Stopped at 12/17/17 2204 .  heparin ADULT infusion 100 units/mL (25000 units/292mL sodium chloride 0.45%), 1,600 Units/hr, Intravenous, Continuous, Bell, Ronaldo Miyamoto, RPH, Last Rate: 16 mL/hr at 12/18/17 0900, 1,600 Units/hr at 12/18/17 0900 .  HYDROmorphone (DILAUDID) injection 1 mg, 1 mg, Intravenous, Q2H PRN,  Elodia Florence., MD, 1 mg at 12/18/17 0859 .  lidocaine (XYLOCAINE) 1 % (with pres) injection, , , PRN, Allred, Darrell K, PA-C, 10 mL at 12/15/17 1141 .  LORazepam (ATIVAN) injection 0.5 mg, 0.5 mg, Intravenous, Q6H PRN, Elodia Florence., MD, 0.5 mg at 12/16/17 0806 .  [DISCONTINUED] ondansetron (ZOFRAN) tablet 4 mg, 4 mg, Oral, Q6H PRN, 4 mg at 12/10/17 0116 **OR** ondansetron (ZOFRAN) injection 4 mg, 4 mg, Intravenous, Q6H PRN, Elodia Florence., MD, 4 mg at 12/14/17 1712 .  phenol (CHLORASEPTIC) mouth spray 1 spray, 1 spray, Mouth/Throat, PRN, Dessa Phi, DO, 1 spray at 12/16/17 1411 .  sodium chloride flush (NS) 0.9 % injection 10-40 mL, 10-40 mL, Intracatheter, Q12H, Dessa Phi, DO, 10 mL at 12/17/17 2136 .  sodium chloride flush (NS) 0.9 % injection 10-40 mL, 10-40 mL, Intracatheter, PRN, Dessa Phi, DO  No facility-administered encounter medications on file as of 11/01/2017.      Labs:  CBC    Component Value Date/Time   WBC 10.9 (H) 12/18/2017 0500   RBC 3.75 (L) 12/18/2017 0500   HGB 8.5 (L) 12/18/2017 0500   HCT 28.1 (L) 12/18/2017 0500   PLT 581 (H) 12/18/2017 0500   MCV 74.9 (L) 12/18/2017 0500   MCH 22.7 (L) 12/18/2017 0500   MCHC 30.2 12/18/2017 0500   RDW 19.1 (H) 12/18/2017 0500   LYMPHSABS 1.2 12/23/2017 1443   MONOABS 1.0 (H) 12/16/2017 1443   EOSABS 0.4 12/15/2017 1443   BASOSABS 0.1 12/11/2017 1443   BMET    Component Value Date/Time   NA 142 12/18/2017 0500   NA 140 10/27/2017 1423   K 3.6 12/18/2017 0500   CL 110 12/18/2017 0500   CO2 22 12/18/2017 0500   GLUCOSE 119 (H) 12/18/2017 0500   BUN 13 12/18/2017 0500   BUN 14 10/27/2017 1423   CREATININE 1.24 (H) 12/18/2017 0500   CALCIUM 8.1 (L) 12/18/2017 0500   GFRNONAA 46 (L) 12/18/2017 0500   GFRAA 54 (L) 12/18/2017 0500     Vitals:  Blood pressure 136/72, pulse 84, temperature 98.4 F (36.9 C), temperature source Oral, resp. rate 18, height 5\' 4"  (1.626 m), weight  259 lb 14.8 oz (117.9 kg), last menstrual period 04/20/2016, SpO2 98 %.  I/O last 3 completed shifts: In: 3950.1 [I.V.:3200.1; IV Piggyback:750] Out: 700 [Urine:450; Emesis/NG output:250] Total I/O In: 2488.5 [I.V.:2488.5] Out: -    Physical Exam: WD in NAD, in no distress; appears more comfortable with the exception of being connected to multiple lines Lungs CTA upper field, improved aeration in bilateral bases 4/13 CV - RRR Abdo - NABS. Soft, NTND. No rebound or guarding. Ext - minimal pitting edema. NT.   Isabel Caprice, MD  12/18/2017, 9:30 AM

## 2017-12-18 NOTE — Progress Notes (Signed)
ANTICOAGULATION CONSULT NOTE - [facility] for heparin Indication: pulmonary embolus  Allergies  Allergen Reactions  . Codeine Nausea And Vomiting    Room spins    Patient Measurements: Height: 5\' 4"  (162.6 cm) Weight: 259 lb 14.8 oz (117.9 kg) IBW/kg (Calculated) : 54.7 Heparin Dosing Weight: 81kg  Vital Signs: Temp: 97.8 F (36.6 C) (04/14 0356) Temp Source: Oral (04/14 0356) BP: 136/72 (04/14 0400) Pulse Rate: 84 (04/14 0400)  Labs: Recent Labs    12/15/17 1909  12/16/17 0019  12/16/17 2683 12/16/17 0650  12/17/17 0647 12/17/17 1532 12/18/17 0500  HGB  --   --   --    < > 8.3*  --   --  8.5*  --  8.5*  HCT  --   --   --   --  27.2*  --   --  28.2*  --  28.1*  PLT  --   --   --   --  615*  --   --  562*  --  581*  HEPARINUNFRC  --    < >  --   --   --   --    < > 0.47 0.40 0.54  CREATININE  --   --   --   --   --  1.55*  --  1.26*  --  1.24*  TROPONINI 0.21*  --  0.11*  --  0.06*  --   --   --   --   --    < > = values in this interval not displayed.    Estimated Creatinine Clearance: 60.9 mL/min (A) (by C-G formula based on SCr of 1.24 mg/dL (H)).   Medical History: Past Medical History:  Diagnosis Date  . Cancer [facility])    endometrial cancer  . Fibroids   . GERD (gastroesophageal reflux disease)   . Hypertension   . Sinusitis     Assessment: 38 YOF with stage IV uterine cancer.  She underwent ex lap with TAH, BSO, omentectomy and debulking on 3/21.  CT chest re-reviewed and determined to have RUL PE.   Pharmacy asked to dose heparin gtt.    Today, 12/18/2017  Heparin level remains therapeutic on current IV heparin rate of 1600 units/hr  CBC: Hgb low but stable. Plts stable  Renal: SCr 1.24 improving, per notes baseline SCr is 1.4  No line or bleeding issues per RN  Goal of Therapy:  Heparin level 0.3-0.7 units/ml Monitor platelets by anticoagulation protocol: Yes   Plan:  1) Continue IV heparin at current rate of 1600  units/hr 2) Await plan for long-term anticoagulation ([facility] vs DOAC) 3) Monitor for signs and symptoms of bleeding 4) Daily heparin level and CBC   Adrian Saran, PharmD, BCPS Pager [phone] 12/18/2017 6:16 AM

## 2017-12-19 LAB — CBC
HCT: 28 % — ABNORMAL LOW (ref 36.0–46.0)
Hemoglobin: 8.5 g/dL — ABNORMAL LOW (ref 12.0–15.0)
MCH: 22.7 pg — ABNORMAL LOW (ref 26.0–34.0)
MCHC: 30.4 g/dL (ref 30.0–36.0)
MCV: 74.9 fL — ABNORMAL LOW (ref 78.0–100.0)
Platelets: 509 10*3/uL — ABNORMAL HIGH (ref 150–400)
RBC: 3.74 MIL/uL — AB (ref 3.87–5.11)
RDW: 19.3 % — ABNORMAL HIGH (ref 11.5–15.5)
WBC: 11 10*3/uL — AB (ref 4.0–10.5)

## 2017-12-19 LAB — GLUCOSE, CAPILLARY
GLUCOSE-CAPILLARY: 128 mg/dL — AB (ref 65–99)
Glucose-Capillary: 120 mg/dL — ABNORMAL HIGH (ref 65–99)

## 2017-12-19 LAB — HEPARIN LEVEL (UNFRACTIONATED): Heparin Unfractionated: 0.41 IU/mL (ref 0.30–0.70)

## 2017-12-19 LAB — BASIC METABOLIC PANEL
ANION GAP: 8 (ref 5–15)
BUN: 11 mg/dL (ref 6–20)
CO2: 20 mmol/L — ABNORMAL LOW (ref 22–32)
Calcium: 8.1 mg/dL — ABNORMAL LOW (ref 8.9–10.3)
Chloride: 113 mmol/L — ABNORMAL HIGH (ref 101–111)
Creatinine, Ser: 1.3 mg/dL — ABNORMAL HIGH (ref 0.44–1.00)
GFR, EST AFRICAN AMERICAN: 51 mL/min — AB (ref >60)
GFR, EST NON AFRICAN AMERICAN: 44 mL/min — AB (ref >60)
Glucose, Bld: 118 mg/dL — ABNORMAL HIGH (ref 65–99)
POTASSIUM: 3.7 mmol/L (ref 3.5–5.1)
SODIUM: 141 mmol/L (ref 135–145)

## 2017-12-19 LAB — MAGNESIUM: MAGNESIUM: 2.1 mg/dL (ref 1.7–2.4)

## 2017-12-19 MED ORDER — GUAIFENESIN-DM 100-10 MG/5ML PO SYRP
5.0000 mL | ORAL_SOLUTION | ORAL | Status: DC | PRN
  Administered 2017-12-19: 5 mL via ORAL
  Filled 2017-12-19: qty 10

## 2017-12-19 MED ORDER — TRACE MINERALS CR-CU-MN-SE-ZN 10-1000-500-60 MCG/ML IV SOLN
INTRAVENOUS | Status: AC
  Administered 2017-12-19: 17:00:00 via INTRAVENOUS
  Filled 2017-12-19 (×2): qty 960

## 2017-12-19 MED ORDER — HYDRALAZINE HCL 20 MG/ML IJ SOLN
5.0000 mg | INTRAMUSCULAR | Status: DC | PRN
  Administered 2017-12-28 – 2017-12-29 (×2): 5 mg via INTRAVENOUS
  Filled 2017-12-19 (×3): qty 1

## 2017-12-19 MED ORDER — DEXTROSE-NACL 5-0.9 % IV SOLN
INTRAVENOUS | Status: DC
  Administered 2017-12-21: 08:00:00 via INTRAVENOUS

## 2017-12-19 MED ORDER — FAT EMULSION PLANT BASED 20 % IV EMUL
240.0000 mL | INTRAVENOUS | Status: AC
  Administered 2017-12-19: 240 mL via INTRAVENOUS
  Filled 2017-12-19: qty 250

## 2017-12-19 MED ORDER — CALCIUM CARBONATE ANTACID 500 MG PO CHEW
1.0000 | CHEWABLE_TABLET | Freq: Four times a day (QID) | ORAL | Status: DC | PRN
  Administered 2017-12-20: 200 mg via ORAL
  Filled 2017-12-19: qty 1

## 2017-12-19 NOTE — Progress Notes (Signed)
Robyn Guerra   DOB:14-Nov-1955   IZ#:[id number]    Assessment & Plan:  Recurrent uterine cancer with malignant ascites and liver metastasis She understood that she has stage IV disease and treatment is strictly palliative in nature She is improving after several days in the intensive care unit Continue supportive care It is not clear to me that she will be strong enough for chemotherapy anytime soon  I will follow  Atrial fibrillation with rapid ventricular response, resolved Could be triggered by pneumonia/pulmonary emboli She is being managed medically  Pulmonary emboli Secondary to malignancy Continue anticoagulation therapy  Aspiration pneumonia secondary to peritoneal carcinomatosis She is on antibiotics treatment  Malignant ascites, nausea, changes in bowel habits, likely due to peritoneal carcinomatosis She have signs and symptoms of peritoneal carcinomatosis She has lost a lot of weight due to persistent nausea Her symptoms of nausea has improved with NG placement  Cancer associated pain, improved Appreciate palliative care consult and pain management  Recent acute renal failuresecondary topoor oral intake Malignant cachexia Moderate protein calorie malnutrition She has responded to IV fluid resuscitation I recommend continue intermittent IV fluid support  Iron deficiency anemia Component of anemia chronic illness Reactive thrombocytosis Iron studies from last week confirmedcomponent of iron deficiency anemia We will consider intravenous iron once her cardiovascular instability has resolved  Goals of care discussion We have extensive discussion about goals of care She understood that treatment is strictly palliative in nature  I recommend palliative care consult for goals of care discussion as well  Discharge planning It is unlikely that she will be ready to go home anytime soon I will follow  Heath Lark, MD 12/19/2017  8:12 AM   Subjective:   Her NG tube was disconnected from suction.  She has been spitting yellowish liquid nonstop during our conversation.  She had bowel movement 2 days ago.  She converted to normal sinus rhythm over the weekend. The patient denies any recent signs or symptoms of bleeding such as spontaneous epistaxis, hematuria or hematochezia. She has been afebrile over the weekend.  Objective:  Vitals:   12/19/17 0404 12/19/17 0500  BP: (!) 151/107   Pulse:  94  Resp:  (!) 29  Temp:    SpO2:  97%     Intake/Output Summary (Last 24 hours) at 12/19/2017 0812 Last data filed at 12/19/2017 0400 Gross per 24 hour  Intake 5482.47 ml  Output -  Net 5482.47 ml    GENERAL:alert, no distress and comfortable.  NG tube is disconnected from suction SKIN: skin color, texture, turgor are normal, no rashes or significant lesions EYES: normal, Conjunctiva are pink and non-injected, sclera clear OROPHARYNX:no exudate, no erythema and lips, buccal mucosa, and tongue normal  NECK: supple, thyroid normal size, non-tender, without nodularity LYMPH:  no palpable lymphadenopathy in the cervical, axillary or inguinal LUNGS: clear to auscultation and percussion with normal breathing effort HEART: regular rate & rhythm and no murmurs and no lower extremity edema ABDOMEN:abdomen soft, distended with reduced bowel sounds  musculoskeletal:no cyanosis of digits and no clubbing  NEURO: alert & oriented x 3 with fluent speech, no focal motor/sensory deficits   Labs:  Lab Results  Component Value Date   WBC 11.0 (H) 12/19/2017   HGB 8.5 (L) 12/19/2017   HCT 28.0 (L) 12/19/2017   MCV 74.9 (L) 12/19/2017   PLT 509 (H) 12/19/2017   NEUTROABS 10.3 (H) 12/22/2017    Lab Results  Component Value Date   NA 141 12/19/2017  K 3.7 12/19/2017   CL 113 (H) 12/19/2017   CO2 20 (L) 12/19/2017    Studies:  Dg Abd Portable 1v  Result Date: 12/18/2017 CLINICAL DATA:  Small bowel obstruction. EXAM: PORTABLE ABDOMEN - 1 VIEW  COMPARISON:  12/17/2017 FINDINGS: Nasogastric tube remains in place with tip in the distal gastric body. Oral contrast material persists throughout the colon. Several mildly dilated small bowel loops are again seen with the left abdomen. Surgical clips noted in the left pelvis. IMPRESSION: No significant change in several mildly dilated small bowel loops in left abdomen. Oral contrast material again seen throughout colon. Electronically Signed   By: Earle Gell M.D.   On: 12/18/2017 07:10

## 2017-12-19 NOTE — Progress Notes (Signed)
Daily Progress Note   Patient Name: Robyn Guerra       Date: 12/19/2017 DOB: 05-20-1956  Age: 62 y.o. MRN#: [id number] Attending Physician: Dessa Phi, DO Primary Care Physician: Lin Landsman, MD Admit Date: 12/05/2017  Reason for Consultation/Follow-up: Pain control  Subjective: I saw and examined Ms. Etheridge this AM.  She reports that she continues to have pain, but it has been fairly well controlled with prn dilaudid.    She reports being happy with current pain management regimen.    Continues to want to see how SBO resolves and trial of chemo prior to having further discussion on long term goals of care. Length of Stay: 7  Current Medications: Scheduled Meds:  . Chlorhexidine Gluconate Cloth  6 each Topical Daily  . sodium chloride flush  10-40 mL Intracatheter Q12H    Continuous Infusions: . amiodarone 30 mg/hr (12/19/17 2100)  . ampicillin-sulbactam (UNASYN) IV Stopped (12/19/17 8280)  . dextrose 5 % and 0.9% NaCl 30 mL/hr at 12/19/17 2100  . famotidine (PEPCID) IV 20 mg (12/19/17 2257)  . Marland KitchenTPN (CLINIMIX-E) Adult 40 mL/hr at 12/19/17 2100   And  . Fat emulsion 20 mL/hr at 12/19/17 2100  . heparin 1,600 Units/hr (12/19/17 2100)    PRN Meds: calcium carbonate, guaiFENesin-dextromethorphan, hydrALAZINE, HYDROmorphone (DILAUDID) injection, lidocaine, LORazepam, [DISCONTINUED] ondansetron **OR** ondansetron (ZOFRAN) IV, phenol, sodium chloride flush  Physical Exam       General: Alert, awake, in no acute distress. NGT in place HEENT: No bruits, no goiter, no JVD Heart: irregular. No murmur appreciated. Lungs: Good air movement, clear Abdomen: Soft, nontender, nondistended, positive bowel sounds.  Ext: No significant  edema Skin: Warm and dry Neuro: Grossly intact, nonfocal.      Vital Signs: BP (!) 130/100   Pulse (!) 104   Temp 98.4 F (36.9 C) (Oral)   Resp (!) 29   Ht 5\' 4"  (1.626 m)   Wt 124.8 kg (275 lb 2.2 oz)   LMP 04/20/2016   SpO2 97%   BMI 47.23 kg/m  SpO2: SpO2: 97 % O2 Device: O2 Device:  Nasal Cannula O2 Flow Rate: O2 Flow Rate (L/min): 2 L/min  Intake/output summary:   Intake/Output Summary (Last 24 hours) at 12/19/2017 2346 Last data filed at 12/19/2017 2100 Gross per 24 hour  Intake 2401.96 ml  Output 450 ml  Net 1951.96 ml   LBM: Last BM Date: 12/17/17 Baseline Weight: Weight: 110.2 kg (242 lb 15.2 oz) Most recent weight: Weight: 124.8 kg (275 lb 2.2 oz)       Palliative Assessment/Data:    Flowsheet Rows     Most Recent Value  Intake Tab  Referral Department  Hospitalist  Unit at Time of Referral  ICU  Palliative Care Primary Diagnosis  Cancer  Date Notified  12/15/17  Palliative Care Type  New Palliative care  Reason for referral  Clarify Goals of Care, Pain  Date of Admission  01/02/2018  Date first seen by Palliative Care  12/16/17  # of days Palliative referral response time  1 Day(s)  # of days IP prior to Palliative referral  6  Clinical Assessment  Palliative Performance Scale Score  50%  Pain Max last 24 hours  8  Pain Min Last 24 hours  3  Psychosocial & Spiritual Assessment  Palliative Care Outcomes  Patient/Family meeting held?  Yes  Palliative Care Outcomes  Clarified goals of care      Patient Active Problem List   Diagnosis Date Noted  . Malnutrition of moderate degree 12/18/2017  . Renal failure 12/12/2017  . Iron deficiency anemia due to chronic blood loss 12/30/2017  . Wound dehiscence, surgical, initial encounter 12/28/2017  . Acute prerenal failure ([place]) 12/31/2017  . Abdominal pain 12/31/2017  . Other constipation 12/10/2017  . Family history of cancer 12/15/2017  . Renal failure, acute ([place]) 12/17/2017  . 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 Peritoneal carcinomatosis ([place])   . Endometrial cancer ([place]) 11/01/2017  . Morbid obesity with BMI of 45.0-49.9, adult ([place]) 11/01/2017    Palliative Care Assessment & Plan   Patient Profile: 62 y.o. female  with past medical history of recurrent uterine cancer status post ex lap, TAH, BSO, and tumor debulking on 3/21 with malignant ascites and liver metastases admitted on 12/17/2017 with uncontrolled pain and acute renal failure.  Her hospital course has been further complicated by pulmonary embolus, A. fib with RVR, elevated troponin, aspiration pneumonia, and small bowel obstruction.  Palliative consulted for pain management and goals of care.  Recommendations/Plan: -Pain: reports currently well controlled.  Recommend continue with intermittent IV pain medications at this time.  Hopefully her small bowel obstruction is will resolving (NGT currently clamped) and we can work on transitioning back to effective oral regimen at that time. - Noted advance directive paperwork at bedside.  She has reviewed and completed but awaiting notarization.  Appreciate spiritual care assistance in facilitating this and also emotionally supporting Ms. Rebel.  Goals of Care and Additional Recommendations:  Limitations on Scope of Treatment: Full Scope Treatment  Code Status:    Code Status Orders  (From admission, onward)        Start     Ordered   12/16/2017 1638  Full code  Continuous     01/02/2018 1647    Code Status History    Date Active Date Inactive Code Status Order ID Comments User Context   11/24/2017 1904 11/27/2017 2046 Full Code [id number]  Isabel Caprice, MD Inpatient       Prognosis:   Unable to determine  Discharge Planning:  To Be Determined  Care plan was discussed with patient, rn  Thank you for allowing the Palliative Medicine Team to assist in the care of this patient.   Total Time 20 minutes Prolonged Time Billed  no         Greater than 50%  of this time was spent counseling and coordinating care related to the above assessment and plan.  Micheline Rough, MD  Please contact Palliative Medicine Team phone at [phone] for questions and concerns.

## 2017-12-19 NOTE — Progress Notes (Addendum)
PROGRESS NOTE    MASAYE GATCHALIAN  BZJ:[id number] DOB: 15-Mar-1956 DOA: 12/20/2017 PCP: Lin Landsman, MD     Brief Narrative:  Robyn Guerra is a 62 y.o. female with medical history significant of HTN, iron deficiency anemia, andstage IVcarcinosarcoma of the uterus s/p ex lap, TAH, BSO, omentectomy, radical tumor debulking on 11/24/17 who represented on 4/5 with post op AKI, constipation and pain. She was seen by Dr. Alvy Bimler on 4/5 who noted acute kidney injury as well as the abdominal discomfort and concern for constipation as well.  She was readmitted by Dr. Denman George given her recent surgery. She was admitted for IV fluids and symptom management.  She had a paracentesis on 4/9 with cytology c/w metastatic adenocarcinoma.  CT abdomen pelvis from 4/8 with new R hepatic lobe lesions worrisome for metastatic disease.  Concern that cause of progressive symptoms is progressive cancer.  Dr. Alvy Bimler planning for palliative chemotherapy.  Hospitalization further complicated by pulmonary embolism, atrial fibrillation with RVR, elevated troponin, small bowel obstruction and aspiration pneumonia.  [facility] consulted on 4/11 as primary team for above issues.  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 Stop IV cardizem. If remains stable and once able to tolerate PO, will transition IV amiodarone to PO amiodarone   Elevated Troponin -Due to demand with PE and A Fib RVR -Troponin 0.21 --> 0.06  SBO   -CT A/P showed dilated loops of proximal and mid small bowel -NGT 229mL output yesterday, had BM yesterday as well  -Per gyn onc team, conservative management for now  -Start TPN to boost nutritional status. 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 Alvy Bimler following, PICC and palliative chemotherapy planned  -Palliative consulted for [place] and pain management  Cancer associated pain -Pain control medications prn   AKI -Baseline Cr 1.2-1.3 -IVF, resolved, back to baseline   Chronic microcytic anemia, thrombocytosis -Monitor CBC    DVT prophylaxis: Heparin gtt Code Status: Full Family Communication: No family at bedside Disposition Plan: Pending improvement in clinical status   Consultants:   Gyn  oncology  Oncology  Antimicrobials:  Anti-infectives (From admission, onward)   Start     Dose/Rate Route Frequency Ordered Stop   12/15/17 1400  Ampicillin-Sulbactam  (UNASYN) 3 g in sodium chloride 0.9 % 100 mL IVPB     3 g 200 mL/hr over 30 Minutes Intravenous Every 6 hours 12/15/17 1239 12/22/17 1359       Subjective: She is tolerating clear liquid diet without any nausea or vomiting.  NG tube remains clamped.  She continues to have abdominal soreness, has not passed any gas or bowel movement since 2 days ago.  Objective: Vitals:   12/19/17 0404 12/19/17 0500 12/19/17 0800 12/19/17 1027  BP: (!) 151/107     Pulse:  94 95 97  Resp:  (!) 29 (!) 24 (!) 28  Temp:   98.5 F (36.9 C)   TempSrc:   Oral   SpO2:  97% 98% 95%  Weight:      Height:        Intake/Output Summary (Last 24 hours) at 12/19/2017 1032 Last data filed at 12/19/2017 0800 Gross per 24 hour  Intake 3259.37 ml  Output -  Net 3259.37 ml   Filed Weights   12/12/2017 1704 12/16/17 0316  Weight: 110.2 kg (242 lb 15.2 oz) 117.9 kg (259 lb 14.8 oz)   Examination: General exam: Appears calm and comfortable  Respiratory system: Clear to auscultation. Respiratory effort normal. Cardiovascular system: S1 & S2 heard, RRR. No JVD, murmurs, rubs, gallops or clicks. No pedal edema. Gastrointestinal system: Abdomen is nondistended, soft and +TTP. No organomegaly or masses felt. No bowel sounds heard. +NGT clamped Central nervous system: Alert and oriented. No focal neurological deficits. Extremities: Symmetric 5 x 5 power. Skin: No rashes, lesions or ulcers Psychiatry: Judgement and insight appear normal. Mood & affect appropriate.     Data Reviewed: I have personally reviewed following labs and imaging studies  CBC: Recent Labs  Lab 12/15/17 0413 12/16/17 0619 12/17/17 0647 12/18/17 0500 12/19/17 0557  WBC 12.6* 12.3* 10.4 10.9* 11.0*  HGB 9.4* 8.3* 8.5* 8.5* 8.5*  HCT 30.6* 27.2* 28.2* 28.1* 28.0*  MCV 74.3* 74.5* 75.0* 74.9* 74.9*  PLT 714* 615* 562* 581* 737*   Basic Metabolic Panel: Recent Labs  Lab 12/15/17 0413 12/16/17 0650 12/17/17 0647 12/18/17 0500  12/19/17 0557  NA 140 140 141 142 141  K 4.1 4.2 3.7 3.6 3.7  CL 106 109 111 110 113*  CO2 24 23 21* 22 20*  GLUCOSE 159* 136* 126* 119* 118*  BUN 19 23* 18 13 11   CREATININE 1.28* 1.55* 1.26* 1.24* 1.30*  CALCIUM 8.9 8.3* 8.2* 8.1* 8.1*  MG  --   --   --   --  2.1   GFR: Estimated Creatinine Clearance: 58.1 mL/min (A) (by C-G formula based on SCr of 1.3 mg/dL (H)). Liver Function Tests: No results for input(s): AST, ALT, ALKPHOS, BILITOT, PROT, ALBUMIN in the last 168 hours. Recent Labs  Lab 12/14/17 0411  LIPASE 16   No results for input(s): AMMONIA in the last 168 hours. Coagulation Profile: No results for input(s): INR, PROTIME in the last 168 hours. Cardiac Enzymes: Recent Labs  Lab 12/15/17 1909 12/16/17 0019 12/16/17 0619  TROPONINI 0.21* 0.11* 0.06*   BNP (last 3 results) No results for input(s): PROBNP in the last 8760 hours. HbA1C: No results for input(s): HGBA1C in the last 72 hours. CBG: No results for input(s): GLUCAP in the last 168 hours. Lipid Profile: No results for input(s):  CHOL, HDL, LDLCALC, TRIG, CHOLHDL, LDLDIRECT in the last 72 hours. Thyroid Function Tests: No results for input(s): TSH, T4TOTAL, FREET4, T3FREE, THYROIDAB in the last 72 hours. Anemia Panel: No results for input(s): VITAMINB12, FOLATE, FERRITIN, TIBC, IRON, RETICCTPCT in the last 72 hours. Sepsis Labs: Recent Labs  Lab 12/15/17 1546  LATICACIDVEN 1.2    Recent Results (from the past 240 hour(s))  Culture, body fluid-bottle     Status: None   Collection Time: 12/13/17 10:00 AM  Result Value Ref Range Status   Specimen Description FLUID PERITONEAL  Final   Special Requests NONE  Final   Culture   Final    NO GROWTH 5 DAYS Performed at [facility] [address] [place], [place] [address]    Report Status 12/18/2017 FINAL  Final  Gram stain     Status: None   Collection Time: 12/13/17 10:00 AM  Result Value Ref Range Status   Specimen Description FLUID  PERITONEAL  Final   Special Requests NONE  Final   Gram Stain   Final    FEW WBC PRESENT, PREDOMINANTLY PMN NO ORGANISMS SEEN Performed at [facility], [place] [address], [place], [place] [address]    Report Status 12/13/2017 FINAL  Final  Culture, blood (routine x 2)     Status: None (Preliminary result)   Collection Time: 12/15/17  1:38 PM  Result Value Ref Range Status   Specimen Description   Final    BLOOD LEFT HAND Performed at [facility] [address], [place], [place] [address]    Special Requests   Final    BOTTLES DRAWN AEROBIC ONLY Blood Culture adequate volume   Culture   Final    NO GROWTH 3 DAYS Performed at [facility], [place] [address], [place], [place] [address]    Report Status PENDING  Incomplete  Culture, blood (routine x 2)     Status: None (Preliminary result)   Collection Time: 12/15/17  1:38 PM  Result Value Ref Range Status   Specimen Description   Final    BLOOD RIGHT HAND Performed at [facility] [address] [place], [place] [address]    Special Requests   Final    BOTTLES DRAWN AEROBIC ONLY Blood Culture adequate volume   Culture   Final    NO GROWTH 3 DAYS Performed at [facility], [place] [address], [place], [place] [address]    Report Status PENDING  Incomplete  MRSA PCR Screening     Status: None   Collection Time: 12/15/17  7:13 PM  Result Value Ref Range Status   MRSA by PCR NEGATIVE NEGATIVE Final    Comment:        The GeneXpert MRSA Assay (FDA approved for NASAL specimens only), is one component of a comprehensive MRSA colonization surveillance program. It is not intended to diagnose MRSA infection nor to guide or monitor treatment for MRSA infections. Performed at [facility], [place] [address], [place],  [address]        Radiology Studies: Dg Abd Portable 1v  Result Date: 12/18/2017 CLINICAL DATA:  Small bowel obstruction. EXAM:  PORTABLE ABDOMEN - 1 VIEW COMPARISON:  12/17/2017 FINDINGS: Nasogastric tube remains in place with tip in the distal gastric body. Oral contrast material persists throughout the colon. Several mildly dilated small bowel loops are again seen with the left abdomen. Surgical clips noted in the left pelvis. IMPRESSION: No significant  change in several mildly dilated small bowel loops in left abdomen. Oral contrast material again seen throughout colon. Electronically Signed   By: Earle Gell M.D.   On: 12/18/2017 07:10      Scheduled Meds: . Chlorhexidine Gluconate Cloth  6 each Topical Daily  . sodium chloride flush  10-40 mL Intracatheter Q12H   Continuous Infusions: . amiodarone 30 mg/hr (12/19/17 0816)  . ampicillin-sulbactam (UNASYN) IV Stopped (12/19/17 1020)  . dextrose 5 % and 0.9% NaCl 50 mL/hr at 12/19/17 1022  . famotidine (PEPCID) IV 20 mg (12/19/17 1023)  . heparin 1,600 Units/hr (12/19/17 0800)     LOS: 7 days    Time spent: 20 minutes   Dessa Phi, DO [facility] [email or web address] Password [facility] 12/19/2017, 10:32 AM

## 2017-12-19 NOTE — Progress Notes (Signed)
Inpatient Progress Note: Gyn-Onc  CC:  1. Abdominal pain and AKI on admit 2. Stage IV uterine cancer; suspected rapid progression with reaccumulation of ascites 3. Pulm embolus 12/15/17 4. Afib with RVR 12/15/17 5. Ileus with question of SBO 12/14/17-present 6. Aspiration PNA versus other  Assessment/Plan:  Robyn. Robyn Guerra  is a 62 y.o.  year old with stage IV carcinosarcoma of the uterus s/p ex lap, TAH, BSO, omentectomy, radical tumor debulking on 11/24/17 now readmitted with postop AKI, and pain.   AKI: baseline creatinine elevated at 1.4 . Holding nephrotoxic agents as able. Monitor.  Anemia: Hb stable. No signs of active bleeding. Defer to Dr. Alvy Bimler.  Ascites: secondary to progressive stage IV uterine carcinosarcoma. S/p paracentesis with limited pain relief. Consider repeat paracentesis if remains symptomatic.  Abdominal pain:  CT abd/pelvis on 12/12/17 to evaluate for occult source of intra-abdominal pathology causing pain did not show apparent source (other than ascites).  Clinically no sign of infection (eg abscess or perforation)  and normal labs and vital signs therefore the suspicion for postop pathologic intraabdominal process is low. Ascites tap with no growth and negative gram stain, some pain relief with initial paracentesis. Lipase normal - no pancreatitis. Lactic acid - normal prior to PE/Afib episode Plain film 4/10 showed no significant obstruction, however has had ileus starting 4/10 Pain improved since admission    Ileus versus partial obstruction. Initial contrast from CT 4/8 in colon. No worsening on plain films. GI prophylaxis in place TPN has been ordered, given prolonged malnutrition and possible upcoming chemo  NGT is in place. Recommend conservative management for now with NGT/IVF  NGT clamped (4/14) mid-morning and residual reported at 6 hours was "zero". If any nausea or emesis will reconnect NGT to wall suction. I discussed this directly with the  nurse, Baker Janus. Continue ice chips for today   Pulm ?PNA versus aspiration on chest CT. Primary team has her on Unasyn.    HPI: Robyn Guerra is a 62 year old nulliparous woman who is seen in consultation at the request of Dr Ihor Dow for high grade endometrial cancer, fibroid uterus.  The patient reports vaginal bleeding for since December, 2017. It is daily but light. She was evaluated with a TVUS on 10/03/16 which showed a uterus with measurements: 14.4 x 5.8 x 9.7 cm. There appears to be a 3.8 x 3.2 x 3.0 cm right posterior submucosal fibroid abutting the endometrial canal. A slightly larger 4.8 x 4.1 x 3.0 cm left posterior subserosal fibroid is also seen. The uterus is retroverted in nature. The endometrium was thickened to 1.8 cm. Vague debris and trace fluid are seen within the lower uterine segment and cervix.    She continued to have vaginal bleeding and a repeat US was performed on 08/19/18 which showed uterine measurements: 14.5 x 10.4 x 10.9 cm. Multiple uterine fibroids are seen. The largest fibroid measuring 4.7 cm, as well as a second fibroid measuring 2.8 cm, are located centrally consistent with submucosal fibroids. The posterior subserosal fibroid is also seen measuring 4.4 cm . These show no significant change compared to prior study. Endometrium thickness could not be visualized due to acoustic shadowing from fibroids described above.  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   CT abdo/pelvis on 10/31/17 showed a few small less than  1 cm retroperitoneal lymph nodes are seen in the left paraaortic and aortocaval spaces. 10 mm right lower quadrant mesenteric lymph node is seen. No abdominal aortic aneurysm. Aortic atherosclerosis.  Enlarged uterus measuring 19.5 cm in length.  Multiple small uterine fibroids are seen, largest measuring approximately 5cm. Diffuse endometrial thickening is seen measuring 24 mm,consistent with history of known endometrial carcinosarcoma. Poorly defined heterogeneously enhancing mass is seen in the right adnexa abutting the uterus, which measures 6.1 x 5.5 cm; suspicious for extra uterine extension of malignancy. No evidence of ascites.  On 11/24/17 she underwent ex lap TAH, BSO, omentectomy and radical tumor debulking.  She did well postoperatively however then subsequently developed severe constipation and bloody stools from her bleeding hemorrhoids.  She held the Lovenox for approximately 2 days on March 30 and April 1 however then resumed it.  She try to avoid Percocet although she does have abdominal pain from her constipation.  She had one episode of emesis on March 28 but none since that time.  She is states she is able to keep down oral agents food and liquids.  She is passing flatus.  She denies fevers.  A small amount of her wound has opened at the inferior aspect.  Labs were checked on 12/24/2017 which revealed a mildly elevated white count 13,000, anemia with a hemoglobin of 10.5, thrombocytosis at 718.  Her creatinine was substantially elevated to 2.6.  Of note her preoperative and postoperative creatinine had been stable at 1.4.  Interval Hx:  CT 12/12/08 showed progression of disease with ascties and liver mets (new) but no obstruction, no free air, no abscesses, no bleeding. Ascites drained 12/13/17 (3 L) with minimal to no pain relief. Gram stain negative.  Pain meds increased on 12/14/17 with limited relief in upper abdominal pain. Lipase normal (no evidence of pancreatitis). 12/14/17 - reported emesis (however not preceeded with nausea). Very poor appetite. Plain films showed no obstruction 12/15/17 - No additional emesis. Not passing flatus. Very little oral intake. Pain is moderately controlled.  12/16/17 - PE diagnosed 4/11 along with  Afib with RVR. Transferred to stepdown. Now on drip for rate control and heparin for PE. Imaging suggesting concern for possible SBO. NGT in place. Difficult to discern documentation of output from overnight. 12/17/17 - Stable plain film. Thinks she may have passed flatus and feels like bowels need to move. No other complaints. 12/18/17 - Had soft BM with flatus. Some urine was unaccounted for (spilled to the chuck) which explains output documentation. 12/19/17 - NGT clamped yesterday mid-day. Had phlegm versus ?other output early this am per her report. States a very small amount. No flatus or BM yesterday or today.   Current Meds:   Current Facility-Administered Medications:  .  [EXPIRED] amiodarone (NEXTERONE PREMIX) 360-4.14 MG/200ML-% (1.8 mg/mL) IV infusion, 60 mg/hr, Intravenous, Continuous, Stopped at 12/16/17 0400 **FOLLOWED BY** amiodarone (NEXTERONE PREMIX) 360-4.14 MG/200ML-% (1.8 mg/mL) IV infusion, 30 mg/hr, Intravenous, Continuous, Elodia Florence., MD, Last Rate: 16.7 mL/hr at 12/19/17 1400, 30 mg/hr at 12/19/17 1400 .  Ampicillin-Sulbactam (UNASYN) 3 g in sodium chloride 0.9 % 100 mL IVPB, 3 g, Intravenous, Q6H, Dessa Phi, DO, Last Rate: 200 mL/hr at 12/19/17 1419, 3 g at 12/19/17 1419 .  Chlorhexidine Gluconate Cloth 2 % PADS 6 each, 6 each, Topical, Daily, Dessa Phi, DO, 6 each at 12/19/17 1214 .  dextrose 5 %-0.9 % sodium chloride infusion, , Intravenous, Continuous, Nyoka Cowden, Terri L, RPH .  famotidine (PEPCID) IVPB 20 mg  premix, 20 mg, Intravenous, Q12H, Dessa Phi, DO, Stopped at 12/19/17 1215 .  TPN (CLINIMIX-E) Adult, , Intravenous, Continuous TPN **AND** Fat emulsion 20 % infusion 240 mL, 240 mL, Intravenous, Continuous TPN, Green, Terri L, RPH .  heparin ADULT infusion 100 units/mL (25000 units/239mL sodium chloride 0.45%), 1,600 Units/hr, Intravenous, Continuous, BellRonaldo Miyamoto, RPH, Last Rate: 16 mL/hr at 12/19/17 1400, 1,600 Units/hr at 12/19/17 1400 .   hydrALAZINE (APRESOLINE) injection 5 mg, 5 mg, Intravenous, Q4H PRN, Dessa Phi, DO .  HYDROmorphone (DILAUDID) injection 1 mg, 1 mg, Intravenous, Q2H PRN, Elodia Florence., MD, 1 mg at 12/19/17 1403 .  lidocaine (XYLOCAINE) 1 % (with pres) injection, , , PRN, Allred, Darrell K, PA-C, 10 mL at 12/15/17 1141 .  LORazepam (ATIVAN) injection 0.5 mg, 0.5 mg, Intravenous, Q6H PRN, Elodia Florence., MD, 0.5 mg at 12/19/17 0158 .  [DISCONTINUED] ondansetron (ZOFRAN) tablet 4 mg, 4 mg, Oral, Q6H PRN, 4 mg at 12/10/17 0116 **OR** ondansetron (ZOFRAN) injection 4 mg, 4 mg, Intravenous, Q6H PRN, Elodia Florence., MD, 4 mg at 12/14/17 1712 .  phenol (CHLORASEPTIC) mouth spray 1 spray, 1 spray, Mouth/Throat, PRN, Dessa Phi, DO, 1 spray at 12/16/17 1411 .  sodium chloride flush (NS) 0.9 % injection 10-40 mL, 10-40 mL, Intracatheter, Q12H, Dessa Phi, DO, 30 mL at 12/18/17 2239 .  sodium chloride flush (NS) 0.9 % injection 10-40 mL, 10-40 mL, Intracatheter, PRN, Dessa Phi, DO  No facility-administered encounter medications on file as of 11/01/2017.      Labs:  CBC    Component Value Date/Time   WBC 11.0 (H) 12/19/2017 0557   RBC 3.74 (L) 12/19/2017 0557   HGB 8.5 (L) 12/19/2017 0557   HCT 28.0 (L) 12/19/2017 0557   PLT 509 (H) 12/19/2017 0557   MCV 74.9 (L) 12/19/2017 0557   MCH 22.7 (L) 12/19/2017 0557   MCHC 30.4 12/19/2017 0557   RDW 19.3 (H) 12/19/2017 0557   LYMPHSABS 1.2 12/31/2017 1443   MONOABS 1.0 (H) 12/27/2017 1443   EOSABS 0.4 12/06/2017 1443   BASOSABS 0.1 12/14/2017 1443   BMET    Component Value Date/Time   NA 141 12/19/2017 0557   NA 140 10/27/2017 1423   K 3.7 12/19/2017 0557   CL 113 (H) 12/19/2017 0557   CO2 20 (L) 12/19/2017 0557   GLUCOSE 118 (H) 12/19/2017 0557   BUN 11 12/19/2017 0557   BUN 14 10/27/2017 1423   CREATININE 1.30 (H) 12/19/2017 0557   CALCIUM 8.1 (L) 12/19/2017 0557   GFRNONAA 44 (L) 12/19/2017 0557   GFRAA 51 (L)  12/19/2017 0557     Vitals:  Blood pressure 122/89, pulse (!) 110, temperature 98.4 F (36.9 C), temperature source Oral, resp. rate (!) 40, height 5\' 4"  (1.626 m), weight 259 lb 14.8 oz (117.9 kg), last menstrual period 04/20/2016, SpO2 (!) 86 %.  I/O last 3 completed shifts: In: 6170.2 [I.V.:5470.2; IV Piggyback:700] Out: 200 [Urine:100; Emesis/NG output:100] Total I/O In: 661.6 [I.V.:661.6] Out: 250 [Urine:250]   Physical Exam: O2sat while I am present is 92% and does not appear tachypneic WD in NAD, in no distress; appears more comfortable with the exception of being connected to multiple lines Lungs CTA upper field, improved aeration in bilateral bases starting 4/13 CV - RRR Abdo - Decreased BS compared to yesterday. Soft, NTND. No rebound or guarding. Ext - minimal pitting edema. NT.    Isabel Caprice, MD  12/19/2017, 2:53 PM

## 2017-12-19 NOTE — Progress Notes (Signed)
PHARMACY - ADULT TOTAL PARENTERAL NUTRITION CONSULT NOTE   Pharmacy Consult for TPN Indication: SBO  Patient Measurements: Height: 5\' 4"  (162.6 cm) Weight: 259 lb 14.8 oz (117.9 kg) IBW/kg (Calculated) : 54.7 TPN AdjBW (KG): 68.6 Body mass index is 44.62 kg/m. Usual Weight: 30 lb weight loss/Dietitian notes 4/8  Insulin Requirements: none, no hx DM  Current Nutrition: NPO, sips/chips  IVF: D5W-NS at 50 ml/hr  Central access: Picc 4/11 TPN start date: 4/15  ASSESSMENT                                                                                       Recurrent Uterine Ca, liver mets, ascites, small bowel obstruction.  Significant events:   Today:   Glucose - serum glucose 118  Electrolytes - K, Mag levels wnl, Corr Ca 9.2  Renal - SCr at 1.3 this am  LFTs - wnl 4/8  TGs - ordered  Prealbumin - ordered  NUTRITIONAL GOALS                                                                                             RD recs: from 4/8 note 70-80 g/day protein, 1700-1900 Kcal/day.  Clinimix / at a goal rate of ml/hr + 20% fat emulsion at  ml/hr to provide: await updated note to calculate goals  PLAN                                                                                                                         At 1800 today:  Start Clinimix E 5/15 at 40 ml/hr.  20% fat emulsion at 20 ml/hr over 12 hr  Plan to advance as tolerated to the goal rate.  TPN to contain standard multivitamins and trace elements.  Reduce IVF to 30 ml/hr.  Add SSI 4/16 if necessary, ordered CBGs q6hr  TPN lab panels on Mondays & Thursdays.  F/u daily.  Minda Ditto PharmD Pager [phone] 12/19/2017, 11:49 AM

## 2017-12-19 NOTE — Progress Notes (Addendum)
ANTICOAGULATION CONSULT NOTE - [place] for heparin Indication: pulmonary embolus  Allergies  Allergen Reactions  . Codeine Nausea And Vomiting    Room spins   Patient Measurements: Height: 5\' 4"  (162.6 cm) Weight: 259 lb 14.8 oz (117.9 kg) IBW/kg (Calculated) : 54.7 Heparin Dosing Weight: 81kg  Vital Signs: Temp: 98.5 F (36.9 C) (04/15 0800) Temp Source: Oral (04/15 0800) BP: 151/107 (04/15 0404) Pulse Rate: 95 (04/15 0800)  Labs: Recent Labs    12/17/17 0647 12/17/17 1532 12/18/17 0500 12/19/17 0557  HGB 8.5*  --  8.5* 8.5*  HCT 28.2*  --  28.1* 28.0*  PLT 562*  --  581* 509*  HEPARINUNFRC 0.47 0.40 0.54 >2.20*  CREATININE 1.26*  --  1.24* 1.30*   Estimated Creatinine Clearance: 58.1 mL/min (A) (by C-G formula based on SCr of 1.3 mg/dL (H)).  Medical History: Past Medical History:  Diagnosis Date  . Cancer [facility])    endometrial cancer  . Fibroids   . GERD (gastroesophageal reflux disease)   . Hypertension   . Sinusitis    Assessment: 58 YOF with stage IV uterine cancer.  She underwent ex lap with TAH, BSO, omentectomy and debulking on 3/21.  CT chest re-reviewed and determined to have RUL PE.   Pharmacy asked to dose heparin gtt.    Today, 12/19/2017  Heparin level drawn from PAC > 2.2; heparin infusing into PICC  Repeat Hep level drawn by venipuncture = 0.41 units/ml-in range   Previous levels maintained in range, anticipate rpt level will be appropriate  CBC: Hgb low but stable. Plts stable  Renal: SCr 1.30 improving, per notes baseline SCr 1.4  No line or bleeding issues per RN  Goal of Therapy:  Heparin level 0.3-0.7 units/ml Monitor platelets by anticoagulation protocol: Yes   Plan:  After rpt Hep level - continue Heparin at 1600 units/hr  Await plan for long-term anticoagulation (LMWH vs DOAC)  Monitor for signs and symptoms of bleeding  Daily heparin level (requested via venipuncture) and CBC  Minda Ditto PharmD Pager [phone] 12/19/2017, 9:17 AM

## 2017-12-20 ENCOUNTER — Inpatient Hospital Stay (HOSPITAL_COMMUNITY): Payer: BLUE CROSS/BLUE SHIELD

## 2017-12-20 DIAGNOSIS — C787 Secondary malignant neoplasm of liver and intrahepatic bile duct: Secondary | ICD-10-CM

## 2017-12-20 DIAGNOSIS — I4891 Unspecified atrial fibrillation: Secondary | ICD-10-CM

## 2017-12-20 DIAGNOSIS — I2699 Other pulmonary embolism without acute cor pulmonale: Secondary | ICD-10-CM

## 2017-12-20 DIAGNOSIS — R180 Malignant ascites: Secondary | ICD-10-CM

## 2017-12-20 DIAGNOSIS — J690 Pneumonitis due to inhalation of food and vomit: Secondary | ICD-10-CM

## 2017-12-20 DIAGNOSIS — R64 Cachexia: Secondary | ICD-10-CM

## 2017-12-20 DIAGNOSIS — R0902 Hypoxemia: Secondary | ICD-10-CM

## 2017-12-20 DIAGNOSIS — Z6841 Body Mass Index (BMI) 40.0 and over, adult: Secondary | ICD-10-CM

## 2017-12-20 DIAGNOSIS — E440 Moderate protein-calorie malnutrition: Secondary | ICD-10-CM

## 2017-12-20 LAB — BLOOD GAS, ARTERIAL
ACID-BASE DEFICIT: 6.1 mmol/L — AB (ref 0.0–2.0)
Acid-base deficit: 6.2 mmol/L — ABNORMAL HIGH (ref 0.0–2.0)
Acid-base deficit: 6.2 mmol/L — ABNORMAL HIGH (ref 0.0–2.0)
BICARBONATE: 19.9 mmol/L — AB (ref 20.0–28.0)
BICARBONATE: 19.6 mmol/L — AB (ref 20.0–28.0)
Bicarbonate: 20.9 mmol/L (ref 20.0–28.0)
DRAWN BY: 257701
DRAWN BY: 257701
Drawn by: 257701
FIO2: 100
FIO2: 100
FIO2: 100
LHR: 24 {breaths}/min
MECHVT: 440 mL
MECHVT: 440 mL
MECHVT: 440 mL
O2 SAT: 94.1 %
O2 Saturation: 93 %
O2 Saturation: 98.1 %
PATIENT TEMPERATURE: 98.6
PATIENT TEMPERATURE: 98.6
PCO2 ART: 53.5 mmHg — AB (ref 32.0–48.0)
PEEP/CPAP: 5 cmH2O
PEEP: 5 cmH2O
PEEP: 5 cmH2O
PH ART: 7.216 — AB (ref 7.350–7.450)
PO2 ART: 80 mmHg — AB (ref 83.0–108.0)
Patient temperature: 98.6
RATE: 18 resp/min
RATE: 22 resp/min
pCO2 arterial: 45.4 mmHg (ref 32.0–48.0)
pCO2 arterial: 42.3 mmHg (ref 32.0–48.0)
pH, Arterial: 7.264 — ABNORMAL LOW (ref 7.350–7.450)
pH, Arterial: 7.287 — ABNORMAL LOW (ref 7.350–7.450)
pO2, Arterial: 87.8 mmHg (ref 83.0–108.0)
pO2, Arterial: 126 mmHg — ABNORMAL HIGH (ref 83.0–108.0)

## 2017-12-20 LAB — PREALBUMIN

## 2017-12-20 LAB — COMPREHENSIVE METABOLIC PANEL
ALK PHOS: 81 U/L (ref 38–126)
ALT: 14 U/L (ref 14–54)
ALT: 14 U/L (ref 14–54)
AST: 17 U/L (ref 15–41)
AST: 14 U/L — AB (ref 15–41)
Albumin: 2 g/dL — ABNORMAL LOW (ref 3.5–5.0)
Albumin: 2 g/dL — ABNORMAL LOW (ref 3.5–5.0)
Alkaline Phosphatase: 72 U/L (ref 38–126)
Anion gap: 12 (ref 5–15)
Anion gap: 8 (ref 5–15)
BILIRUBIN TOTAL: 0.5 mg/dL (ref 0.3–1.2)
BUN: 11 mg/dL (ref 6–20)
BUN: 15 mg/dL (ref 6–20)
CALCIUM: 8.1 mg/dL — AB (ref 8.9–10.3)
CHLORIDE: 107 mmol/L (ref 101–111)
CHLORIDE: 113 mmol/L — AB (ref 101–111)
CO2: 15 mmol/L — AB (ref 22–32)
CO2: 21 mmol/L — ABNORMAL LOW (ref 22–32)
CREATININE: 1.64 mg/dL — AB (ref 0.44–1.00)
Calcium: 8.2 mg/dL — ABNORMAL LOW (ref 8.9–10.3)
Creatinine, Ser: 1.47 mg/dL — ABNORMAL HIGH (ref 0.44–1.00)
GFR, EST AFRICAN AMERICAN: 44 mL/min — AB (ref >60)
GFR, EST AFRICAN AMERICAN: 38 mL/min — AB (ref >60)
GFR, EST NON AFRICAN AMERICAN: 38 mL/min — AB (ref >60)
GFR, EST NON AFRICAN AMERICAN: 33 mL/min — AB (ref >60)
Glucose, Bld: 648 mg/dL (ref 65–99)
Glucose, Bld: 153 mg/dL — ABNORMAL HIGH (ref 65–99)
POTASSIUM: 5 mmol/L (ref 3.5–5.1)
Potassium: 4.1 mmol/L (ref 3.5–5.1)
SODIUM: 134 mmol/L — AB (ref 135–145)
Sodium: 142 mmol/L (ref 135–145)
Total Bilirubin: 0.4 mg/dL (ref 0.3–1.2)
Total Protein: 6.6 g/dL (ref 6.5–8.1)
Total Protein: 6.6 g/dL (ref 6.5–8.1)

## 2017-12-20 LAB — CULTURE, BLOOD (ROUTINE X 2)
CULTURE: NO GROWTH
Culture: NO GROWTH
SPECIAL REQUESTS: ADEQUATE
Special Requests: ADEQUATE

## 2017-12-20 LAB — MAGNESIUM
MAGNESIUM: 2.4 mg/dL (ref 1.7–2.4)
MAGNESIUM: 2.2 mg/dL (ref 1.7–2.4)

## 2017-12-20 LAB — DIFFERENTIAL
BASOS ABS: 0 10*3/uL (ref 0.0–0.1)
Basophils Relative: 0 %
EOS ABS: 0.2 10*3/uL (ref 0.0–0.7)
Eosinophils Relative: 1 %
LYMPHS ABS: 1.1 10*3/uL (ref 0.7–4.0)
Lymphocytes Relative: 6 %
MONO ABS: 1.3 10*3/uL — AB (ref 0.1–1.0)
Monocytes Relative: 7 %
NEUTROS ABS: 16 10*3/uL — AB (ref 1.7–7.7)
NEUTROS PCT: 86 %

## 2017-12-20 LAB — GLUCOSE, CAPILLARY
GLUCOSE-CAPILLARY: 153 mg/dL — AB (ref 65–99)
GLUCOSE-CAPILLARY: 172 mg/dL — AB (ref 65–99)
Glucose-Capillary: 116 mg/dL — ABNORMAL HIGH (ref 65–99)

## 2017-12-20 LAB — CBC
HCT: 28.2 % — ABNORMAL LOW (ref 36.0–46.0)
HEMOGLOBIN: 9 g/dL — AB (ref 12.0–15.0)
MCH: 24.5 pg — AB (ref 26.0–34.0)
MCHC: 31.9 g/dL (ref 30.0–36.0)
MCV: 76.6 fL — ABNORMAL LOW (ref 78.0–100.0)
PLATELETS: 558 10*3/uL — AB (ref 150–400)
RBC: 3.68 MIL/uL — AB (ref 3.87–5.11)
RDW: 19.9 % — ABNORMAL HIGH (ref 11.5–15.5)
WBC: 18.6 10*3/uL — ABNORMAL HIGH (ref 4.0–10.5)

## 2017-12-20 LAB — HEPARIN LEVEL (UNFRACTIONATED)
HEPARIN UNFRACTIONATED: 0.1 [IU]/mL — AB (ref 0.30–0.70)
Heparin Unfractionated: 0.32 IU/mL (ref 0.30–0.70)

## 2017-12-20 LAB — PHOSPHORUS
PHOSPHORUS: 5.9 mg/dL — AB (ref 2.5–4.6)
PHOSPHORUS: 5 mg/dL — AB (ref 2.5–4.6)

## 2017-12-20 LAB — TRIGLYCERIDES
TRIGLYCERIDES: 1552 mg/dL — AB (ref <150)
Triglycerides: 68 mg/dL (ref <150)

## 2017-12-20 MED ORDER — FENTANYL 2500MCG IN NS 250ML (10MCG/ML) PREMIX INFUSION
25.0000 ug/h | INTRAVENOUS | Status: DC
  Administered 2017-12-20: 50 ug/h via INTRAVENOUS
  Administered 2017-12-20: 125 ug/h via INTRAVENOUS
  Administered 2017-12-21: 100 ug/h via INTRAVENOUS
  Administered 2017-12-22: 200 ug/h via INTRAVENOUS
  Administered 2017-12-22 (×2): 250 ug/h via INTRAVENOUS
  Administered 2017-12-23: 200 ug/h via INTRAVENOUS
  Administered 2017-12-24: 350 ug/h via INTRAVENOUS
  Administered 2017-12-24: 175 ug/h via INTRAVENOUS
  Administered 2017-12-25: 300 ug/h via INTRAVENOUS
  Administered 2017-12-25 (×3): 350 ug/h via INTRAVENOUS
  Administered 2017-12-26 – 2017-12-27 (×7): 400 ug/h via INTRAVENOUS
  Filled 2017-12-20 (×22): qty 250

## 2017-12-20 MED ORDER — INSULIN ASPART 100 UNIT/ML ~~LOC~~ SOLN
0.0000 [IU] | Freq: Four times a day (QID) | SUBCUTANEOUS | Status: DC
  Administered 2017-12-21: 1 [IU] via SUBCUTANEOUS
  Administered 2017-12-21: 2 [IU] via SUBCUTANEOUS
  Administered 2017-12-22 – 2017-12-25 (×7): 1 [IU] via SUBCUTANEOUS
  Administered 2017-12-25: 2 [IU] via SUBCUTANEOUS
  Administered 2017-12-25: 1 [IU] via SUBCUTANEOUS
  Administered 2017-12-25: 2 [IU] via SUBCUTANEOUS
  Administered 2017-12-25 – 2017-12-26 (×2): 1 [IU] via SUBCUTANEOUS
  Administered 2017-12-26 (×2): 2 [IU] via SUBCUTANEOUS
  Administered 2017-12-27: 1 [IU] via SUBCUTANEOUS
  Administered 2017-12-27 (×2): 2 [IU] via SUBCUTANEOUS
  Administered 2017-12-27: 1 [IU] via SUBCUTANEOUS
  Administered 2017-12-27 – 2017-12-28 (×2): 2 [IU] via SUBCUTANEOUS
  Administered 2017-12-29: 1 [IU] via SUBCUTANEOUS

## 2017-12-20 MED ORDER — ETOMIDATE 2 MG/ML IV SOLN
20.0000 mg | Freq: Once | INTRAVENOUS | Status: AC
  Administered 2017-12-20: 20 mg via INTRAVENOUS

## 2017-12-20 MED ORDER — FENTANYL CITRATE (PF) 2500 MCG/50ML IJ SOLN: 25.0000 ug/h | INTRAMUSCULAR | Status: DC

## 2017-12-20 MED ORDER — MIDAZOLAM HCL 2 MG/2ML IJ SOLN
2.0000 mg | INTRAMUSCULAR | Status: DC | PRN
  Administered 2017-12-20 (×2): 2 mg via INTRAVENOUS
  Administered 2017-12-21: 1 mg via INTRAVENOUS
  Filled 2017-12-20 (×2): qty 2

## 2017-12-20 MED ORDER — VANCOMYCIN HCL IN DEXTROSE 1-5 GM/200ML-% IV SOLN
1000.0000 mg | INTRAVENOUS | Status: DC
  Filled 2017-12-20: qty 200

## 2017-12-20 MED ORDER — HEPARIN (PORCINE) IN NACL 100-0.45 UNIT/ML-% IJ SOLN
1800.0000 [IU]/h | INTRAMUSCULAR | Status: DC
Start: 2017-12-20 — End: 2017-12-22
  Administered 2017-12-20 – 2017-12-21 (×2): 1800 [IU]/h via INTRAVENOUS
  Filled 2017-12-20 (×3): qty 250

## 2017-12-20 MED ORDER — FENTANYL BOLUS VIA INFUSION
50.0000 ug | INTRAVENOUS | Status: DC | PRN
  Administered 2017-12-20 – 2017-12-29 (×22): 50 ug via INTRAVENOUS
  Filled 2017-12-20: qty 50

## 2017-12-20 MED ORDER — SODIUM CHLORIDE 0.9 % IV SOLN
25.0000 ug/h | INTRAVENOUS | Status: DC
  Filled 2017-12-20: qty 50

## 2017-12-20 MED ORDER — ROCURONIUM BROMIDE 50 MG/5ML IV SOLN
60.0000 mg | Freq: Once | INTRAVENOUS | Status: AC
  Administered 2017-12-20: 60 mg via INTRAVENOUS

## 2017-12-20 MED ORDER — ALBUTEROL SULFATE (2.5 MG/3ML) 0.083% IN NEBU: 2.5000 mg | INHALATION_SOLUTION | RESPIRATORY_TRACT | Status: DC | PRN

## 2017-12-20 MED ORDER — CLINIMIX E/DEXTROSE (5/15) 5 % IV SOLN
INTRAVENOUS | Status: AC
  Administered 2017-12-20: 18:00:00 via INTRAVENOUS
  Filled 2017-12-20 (×2): qty 960

## 2017-12-20 MED ORDER — DEXMEDETOMIDINE HCL IN NACL 200 MCG/50ML IV SOLN
0.4000 ug/kg/h | INTRAVENOUS | Status: DC
  Administered 2017-12-20: 0.7 ug/kg/h via INTRAVENOUS
  Administered 2017-12-20: 0.4 ug/kg/h via INTRAVENOUS
  Administered 2017-12-20 (×2): 0.6 ug/kg/h via INTRAVENOUS
  Administered 2017-12-21 (×2): 0.4 ug/kg/h via INTRAVENOUS
  Administered 2017-12-21: 0.7 ug/kg/h via INTRAVENOUS
  Administered 2017-12-21 (×2): 0.4 ug/kg/h via INTRAVENOUS
  Administered 2017-12-21: 0.7 ug/kg/h via INTRAVENOUS
  Filled 2017-12-20 (×12): qty 50

## 2017-12-20 MED ORDER — MIDAZOLAM HCL 2 MG/2ML IJ SOLN
INTRAMUSCULAR | Status: AC
  Administered 2017-12-20: 2 mg
  Filled 2017-12-20: qty 4

## 2017-12-20 MED ORDER — FAT EMULSION PLANT BASED 20 % IV EMUL
240.0000 mL | INTRAVENOUS | Status: AC
  Administered 2017-12-20: 240 mL via INTRAVENOUS
  Filled 2017-12-20: qty 250

## 2017-12-20 MED ORDER — VANCOMYCIN HCL 10 G IV SOLR
2500.0000 mg | Freq: Once | INTRAVENOUS | Status: AC
  Administered 2017-12-20: 2500 mg via INTRAVENOUS
  Filled 2017-12-20: qty 2000

## 2017-12-20 MED ORDER — FENTANYL CITRATE (PF) 100 MCG/2ML IJ SOLN
INTRAMUSCULAR | Status: AC
  Administered 2017-12-20: 100 ug
  Filled 2017-12-20: qty 4

## 2017-12-20 NOTE — Procedures (Signed)
Intubation Procedure Note Robyn Guerra [id number] [birth year]  Procedure: Intubation Indications: Respiratory insufficiency  Procedure Details Consent: Risks of procedure as well as the alternatives and risks of each were explained to the (patient/caregiver).  Consent for procedure obtained. Time Out: Verified patient identification, verified procedure, site/side was marked, verified correct patient position, special equipment/implants available, medications/allergies/relevent history reviewed, required imaging and test results available.  Performed  Maximum sterile technique was used including N/A.  MAC and 3  7.52m tube, final position 23cm at the teeth.  Evaluation Hemodynamic Status: BP stable throughout; O2 sats: transiently fell during during procedure Patient's Current Condition: stable Complications: Complications of Dislogement of loose teeth. Esophageal intubation. Right maintem intubation. Patient did tolerate procedure well. Chest X-ray ordered to verify placement.  CXR: tube position low-repostitioned.   REinar GradAgarwala 12/20/2017

## 2017-12-20 NOTE — Progress Notes (Signed)
ANTICOAGULATION CONSULT NOTE - [place] for heparin Indication: pulmonary embolus  Allergies  Allergen Reactions  . Codeine Nausea And Vomiting    Room spins   Patient Measurements: Height: 5\' 4"  (162.6 cm) Weight: 266 lb 15.6 oz (121.1 kg) IBW/kg (Calculated) : 54.7 Heparin Dosing Weight: 81kg  Vital Signs: Temp: 98 F (36.7 C) (04/16 0345) Temp Source: Oral (04/16 0345) BP: 170/122 (04/16 0400) Pulse Rate: 112 (04/16 0400)  Labs: Recent Labs    12/17/17 0647  12/18/17 0500 12/19/17 0557 12/19/17 0857 12/20/17 0416  HGB 8.5*  --  8.5* 8.5*  --   --   HCT 28.2*  --  28.1* 28.0*  --   --   PLT 562*  --  581* 509*  --   --   HEPARINUNFRC 0.47   < > 0.54 >2.20* 0.41 0.10*  CREATININE 1.26*  --  1.24* 1.30*  --   --    < > = values in this interval not displayed.   Estimated Creatinine Clearance: 59.1 mL/min (A) (by C-G formula based on SCr of 1.3 mg/dL (H)).  Medical History: Past Medical History:  Diagnosis Date  . Cancer [facility])    endometrial cancer  . Fibroids   . GERD (gastroesophageal reflux disease)   . Hypertension   . Sinusitis    Assessment: 55 YOF with stage IV uterine cancer.  She underwent ex lap with TAH, BSO, omentectomy and debulking on 3/21.  CT chest re-reviewed and determined to have RUL PE.   Pharmacy asked to dose heparin gtt.    Today, 12/20/2017  0416 HL=0.10 - per RN heparin is now running in peripheral site and HL was drawn from [facility]??? RN confirmed heparin was not leaking and site was good.   Previous levels maintained in range, not sure why level in now decreased.  CBC: Hgb low but stable. Plts stable  Renal: SCr 1.30 improving, per notes baseline SCr 1.4  No line or bleeding issues per RN  Goal of Therapy:  Heparin level 0.3-0.7 units/ml Monitor platelets by anticoagulation protocol: Yes   Plan:  Will increase heparin drip to 1700 units/hr  Await plan for long-term anticoagulation ([facility] vs DOAC)  Monitor for signs and symptoms of bleeding  Daily heparin level and CBC   Dorrene German 12/20/2017, 6:10 AM

## 2017-12-20 NOTE — Progress Notes (Signed)
PT Cancellation Note  Patient Details Name: Robyn Guerra MRN: [id number] DOB: 1955-10-23   Cancelled Treatment:    Reason Eval/Treat Not Completed: Medical issues which prohibited therapy(pt on ventilator, will follow. )   Philomena Doheny 12/20/2017, 1:29 PM [phone]

## 2017-12-20 NOTE — Progress Notes (Signed)
Nutrition Follow-up  DOCUMENTATION CODES:   Morbid obesity, Non-severe (moderate) malnutrition in context of acute illness/injury  INTERVENTION:  - Continue TPN/TPN advancement per Pharmacy. - RD will monitor for additional nutrition-related needs and plans.   NUTRITION DIAGNOSIS:   Inadequate oral intake related to inability to eat as evidenced by NPO status. -ongoing  GOAL:   Provide needs based on ASPEN/SCCM guidelines -unmet with current TPN regimen  MONITOR:   Vent status, Weight trends, Labs, Other (Comment)(TPN regimen)  REASON FOR ASSESSMENT:   Ventilator  ASSESSMENT:   62 y.o.  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 She also has acute blood loss anemia.  Significant Events: 4/5: admission 4/11: PICC placed 4/12: NGT placed 4/15: NGT removed + TPN started 4/16: intubated and NGT replaced   Weight trending up since admission (+24 lbs/10.9 kg). Used admission weight (110.2 kg) in re-estimating kcal need. Pt with NGT in place with ~50cc of red jello-looking output. Pt with double lumen PICC to R brachial and is receiving Clinimix E 5/15 @ 40 mL/hr with ILE on hold per ICU protocol. This regimen is providing 48 grams of protein and 682 kcal.  She was intubated earlier this AM for respiratory insufficiency.    Patient is currently intubated on ventilator support MV: 14.6 L/min Temp (24hrs), Avg:98.4 F (36.9 C), Min:98 F (36.7 C), Max:98.6 F (37 C) Propofol: none BP: 165/99 and MAP: 112  Medications reviewed; 20 mg IV Pepcid BID.  Labs reviewed; CBGs: 153 and 172 mg/dL this AM, Na: 134 mmol/L, creatinine: 1.47 mg/dL, Ca: 8.2 mg/dL, Phos: 5.9 mg/dL, GFR: 44 mL/min.   IVF: D5-NS @ 30 mL/hr (122 kcal).  Drips: Amiodarone @ 30 mg/hr, Heparin @ 1700 units/hr, Fentanyl @ 400 mcg/hr.     Diet Order:  TPN (CLINIMIX-E)  Adult Diet NPO time specified  EDUCATION NEEDS:   No education needs have been identified at this time  Skin:  Skin Assessment: Reviewed RN Assessment  Last BM:  4/13  Height:   Ht Readings from Last 1 Encounters:  12/20/17 5\' 4"  (1.626 m)    Weight:   Wt Readings from Last 1 Encounters:  12/20/17 266 lb 15.6 oz (121.1 kg)    Ideal Body Weight:  54.5 kg  BMI:  Body mass index is 45.83 kg/m.  Estimated Nutritional Needs:   Kcal:  0165-5374 (11-14 kcal/kg actual body weight)  Protein:  >/= 136 grams (2.5 grams/kg IBW)  Fluid:  1.7L/day       Jarome Matin, MS, RD, LDN, CNSC Inpatient Clinical Dietitian Pager # [phone] After hours/weekend pager # [phone]

## 2017-12-20 NOTE — Progress Notes (Signed)
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  Her sister, who is her medical healthcare power of attorney, agree with the plan of care  Atrial fibrillation with rapid ventricular response Could be triggered by pneumonia/pulmonary emboli She is being managed medically  Pulmonary emboli Secondary to malignancy Continue anticoagulation therapy  Aspiration pneumonia secondary to peritoneal carcinomatosis with respiratory failure She is on antibiotics treatment and ventilation support  Malignant ascites, nausea, changes in bowel habits, likely due to peritoneal carcinomatosis She have signs and symptoms of peritoneal carcinomatosis She has lost a lot of weight due to persistent nausea She will continue NG tube decompression  Recent acute renal failuresecondary topoor oral intake Malignant cachexia Moderate protein calorie malnutrition She has responded to IV fluid resuscitation  Goals of care discussion I have extensive discussion of goals of care with the sister She would like the patient to be stabilized the next 24-48 hours before making decision to transition her care to comfort measures I am supportive of her decision about the plan of care  Discharge planning Likely terminal extubation over the next 24-48 hours  Heath Lark, MD 12/20/2017  11:38 AM   Subjective:  The patient was noted to be intubated and ventilated recently.  She was suspected to have recurrent aspiration pneumonia since yesterday. She have relapse of atrial fibrillation with rapid ventricular response Her sister and multiple other family members are present.  Objective:  Vitals:   12/20/17 1000 12/20/17 1100  BP: (!) 175/118 (!) 165/99  Pulse: (!) 136 (!) 120  Resp:    Temp:    SpO2: 90% 93%      Intake/Output Summary (Last 24 hours) at 12/20/2017 1138 Last data filed at 12/20/2017 1100 Gross per 24 hour  Intake 2897.48 ml  Output 630 ml  Net 2267.48 ml    GENERAL: Unable to assess.  Intubated, sedated and ventilated   Labs:  Lab Results  Component Value Date   WBC 18.6 (H) 12/20/2017   HGB 9.0 (L) 12/20/2017   HCT 28.2 (L) 12/20/2017   MCV 76.6 (L) 12/20/2017   PLT 558 (H) 12/20/2017   NEUTROABS 16.0 (H) 12/20/2017    Lab Results  Component Value Date   NA 134 (L) 12/20/2017   K 5.0 12/20/2017   CL 107 12/20/2017   CO2 15 (L) 12/20/2017    Studies:  Dg Abd 1 View  Result Date: 12/20/2017 CLINICAL DATA:  Nasogastric tube placement. EXAM: ABDOMEN - 1 VIEW COMPARISON:  Abdominal radiograph performed 12/18/2017 FINDINGS: The patient's enteric tube is noted ending overlying the body of the stomach. The visualized bowel gas pattern is grossly unremarkable, though difficult to fully assess due to motion artifact. No acute osseous abnormalities are seen. IMPRESSION: Enteric tube noted ending overlying the body of the stomach. Electronically Signed   By: Garald Balding M.D.   On: 12/20/2017 04:49   Dg Chest Port 1 View  Result Date: 12/20/2017 CLINICAL DATA:  Post intubation. EXAM: PORTABLE CHEST 1 VIEW COMPARISON:  Earlier today at L1 50 hours in FINDINGS: Endotracheal tube borderline low in position, 1.6 cm above carina. Patient rotated right. Right-sided PICC line terminates at low SVC. Nasogastric tube terminates in the distal esophagus, likely looped within. Cardiomegaly accentuated by AP portable technique. No pleural effusion or pneumothorax. Suspect mild pulmonary  venous congestion, accentuated by low lung volumes. Left greater than right base airspace disease. IMPRESSION: Endotracheal tube borderline low in position, 1.6 cm above carina. Consider retraction 2-3 cm. Nasogastric tube within the distal esophagus. Recommend advancement. Slightly worsened aeration with  developing pulmonary venous congestion and right base atelectasis. Left base airspace disease remains. These results will be called to the ordering clinician or representative by the Radiologist Assistant, and communication documented in the PACS or zVision Dashboard. Electronically Signed   By: Abigail Miyamoto M.D.   On: 12/20/2017 10:15   Dg Chest Port 1 View  Result Date: 12/20/2017 CLINICAL DATA:  Acute onset of shortness of breath. EXAM: PORTABLE CHEST 1 VIEW COMPARISON:  CT of the chest performed 12/15/2017 FINDINGS: The lungs are well-aerated. Left basilar airspace opacity is compatible with pneumonia. There is no evidence of pleural effusion or pneumothorax. The cardiomediastinal silhouette is borderline enlarged. No acute osseous abnormalities are seen. A right PICC is noted ending about the distal SVC. IMPRESSION: 1. Left basilar pneumonia noted. 2. Borderline cardiomegaly. Electronically Signed   By: Garald Balding M.D.   On: 12/20/2017 02:24

## 2017-12-20 NOTE — Progress Notes (Signed)
PHARMACY - ADULT TOTAL PARENTERAL NUTRITION CONSULT NOTE   Pharmacy Consult for TPN Indication: SBO  Patient Measurements: Height: 5\' 4"  (162.6 cm) Weight: 266 lb 15.6 oz (121.1 kg) IBW/kg (Calculated) : 54.7 TPN AdjBW (KG): 68.6 Body mass index is 45.83 kg/m. Usual Weight: 30 lb weight loss/Dietitian notes 4/8  Insulin Requirements: No hx DM   Current Nutrition: NPO, sips/chips  IVF: D5W-NS at 50 ml/hr  Central access: Picc 4/11 TPN start date: 4/15  ASSESSMENT                                                                                        Recurrent Uterine Ca, liver mets, ascites, small bowel obstruction.  Significant events:  AM labs drawn 0415 inaccurate - likely TPN and Lipids included in sample leading to serum glucose reported as 648 gm/dl, and Triglycerides as 1552 mg/dl Today:   Glucose - serum glucose 153  Electrolytes - K 4.1, Mag 2.2,  Corr Ca 9.2, Phosphorus elevated at 5.0, but Phosphorus Corr Ca product < 55  Renal - SCr increased to 1.64  LFTs - wnl   TGs - 68  Prealbumin - < 5  NUTRITIONAL GOALS                                                                                             RD recs: from 4/8 note 70-80 g/day protein, 1700-1900 Kcal/day.  Clinimix / at a goal rate of ml/hr + 20% fat emulsion at  ml/hr to provide: await updated note to calculate goals  PLAN                                                                                                                         At 1800 today:  Continue Clinimix E 5/15 at 40 ml/hr  20% fat emulsion at 20 ml/hr over 12 hr  Plan to advance as tolerated to the goal rate.  TPN to contain standard multivitamins and trace elements.  Continue IVF at 30 ml/hr.  Add sensitive scale Novolog, CBGs q6hr  TPN lab panels on Mondays & Thursdays.  Bmet, Magnesium, Phosphorus levels in am  If Phosphorus levels continue to increase, will change to no electrolyte formula  F/u  daily.  Minda Ditto PharmD  Pager [phone] 12/20/2017, 2:06 PM

## 2017-12-20 NOTE — Consult Note (Signed)
PULMONARY / CRITICAL CARE MEDICINE   Name: Robyn Guerra MRN: [id number] DOB: 06-24-1956    ADMISSION DATE:  12/20/2017 CONSULTATION DATE:  12/20/2017  REFERRING MD:  Maylene Roes - [facility].   CHIEF COMPLAINT:  Dyspnea.  HISTORY OF PRESENT ILLNESS:   62 year old woman with stage IV uterine cancer s/p ex lap, TAH, BSO, omentectomy, radical tumor debulking on 11/24/17.  Went home with sister and wasreadmitted with postop AKI, and pain. Underwent paracentesis of malignant ascites with improvement in symptoms.    While planning for palliative chemotherapy, the patient developed AF related to PE, SBO and aspiration pneumonia. While initially improved, she coughed out NGT 4/15 and vomited and aspirated. By the morning of 4/16 we were consulted for increasing dyspnea.   The patient has been informed of her poor long-term prognosis, but is still with her sister requesting intubation. To the extent that some of her current condition may be related to her immediate post-operative state, a trial of short term intubation is reasonable.   PAST MEDICAL HISTORY :  She  has a past medical history of Cancer ([place]), Fibroids, GERD (gastroesophageal reflux disease), Hypertension, and Sinusitis.  PAST SURGICAL HISTORY: She  has a past surgical history that includes Myomectomy; laparotomy (N/A, 11/24/2017); Abdominal hysterectomy (N/A, 11/24/2017); Salpingoophorectomy (Bilateral, 11/24/2017); Lymph node dissection (N/A, 11/24/2017); and Debulking (N/A, 11/24/2017).  Allergies  Allergen Reactions  . Codeine Nausea And Vomiting    Room spins    No current facility-administered medications on file prior to encounter.    Current Outpatient Medications on File Prior to Encounter  Medication Sig  . acetaminophen (TYLENOL) 500 MG tablet Take 500 mg by mouth every 6 (six) hours as needed for moderate pain.  Marland Kitchen BIOTIN PO Take 1 tablet by mouth daily.  Marland Kitchen docusate sodium (COLACE) 100 MG capsule Take 1 capsule (100  mg total) by mouth 2 (two) times daily.  Marland Kitchen enoxaparin (LOVENOX) 40 MG/0.4ML injection Inject 0.4 mLs (40 mg total) into the skin daily for 25 days.  . hydrochlorothiazide (HYDRODIURIL) 25 MG tablet Take 1 tablet (25 mg total) by mouth daily.  Marland Kitchen ibuprofen (ADVIL,MOTRIN) 200 MG tablet Take 400 mg by mouth every 6 (six) hours as needed for moderate pain.  Marland Kitchen oxyCODONE-acetaminophen (PERCOCET/ROXICET) 5-325 MG tablet Take 1-2 tablets by mouth every 4 (four) hours as needed for severe pain.  . polyethylene glycol (MIRALAX / GLYCOLAX) packet Take 17 g by mouth 2 (two) times daily.  . Wheat Dextrin (BENEFIBER DRINK MIX PO) Take 30 mLs by mouth once.  . diphenhydramine-acetaminophen (TYLENOL PM) 25-500 MG TABS tablet Take 1 tablet by mouth at bedtime as needed (sleep).  Marland Kitchen ibuprofen (ADVIL,MOTRIN) 600 MG tablet Take 1 tablet (600 mg total) by mouth every 6 (six) hours as needed. (Patient not taking: Reported on 12/23/2017)    FAMILY HISTORY:  Her indicated that the status of her mother is unknown. She indicated that both of her brothers are alive. She indicated that the status of her paternal grandmother is unknown.   SOCIAL HISTORY: She  reports that she quit smoking about 39 years ago. Her smoking use included cigarettes. She quit after 4.00 years of use. She has never used smokeless tobacco. She reports that she drinks alcohol. She reports that she does not use drugs.  REVIEW OF SYSTEMS:   A full ROS was not obtained due to the patient's critical state  SUBJECTIVE:  Reports increased dyspnea and ongoing abdominal pain.  VITAL SIGNS: BP (!) 181/107  Pulse (!) 110   Temp 98.6 F (37 C) (Oral)   Resp (!) 38   Ht 5\' 4"  (1.626 m)   Wt 266 lb 15.6 oz (121.1 kg)   LMP 04/20/2016   SpO2 95%   BMI 45.83 kg/m   HEMODYNAMICS:  On no vasopressors.  VENTILATOR SETTINGS: Vent Mode: PRVC FiO2 (%):  [100 %] 100 % Set Rate:  [18 bmp] 18 bmp Vt Set:  [440 mL] 440 mL PEEP:  [5 cmH20] 5  cmH20 Plateau Pressure:  [33 cmH20] 33 cmH20  INTAKE / OUTPUT: I/O last 3 completed shifts: In: 4513.5 [I.V.:3813.5; IV Piggyback:700] Out: 750 [Urine:750]  PHYSICAL EXAMINATION: General:  Chronically ill appearing woman in moderate respiratory distress. Neuro:  Awake able to answer simple questions. Moving all limbs. HEENT:  Very poor dentition with 2 very loose incisors. Cardiovascular:  Hypertensive to 180's. Extremities are warm and well perfused. HS are normal. Lungs:  Decrease air-entry to the bases.  Abdomen:  Distended and tender to palpation. BS are present but decrease. Copious feculent NG aspirate. Musculoskeletal:  Moderate loss of muscle mass. Skin:  Intact, moderate edema.  LABS:  BMET Recent Labs  Lab 12/18/17 0500 12/19/17 0557 12/20/17 0415  NA 142 141 134*  K 3.6 3.7 5.0  CL 110 113* 107  CO2 22 20* 15*  BUN 13 11 11   CREATININE 1.24* 1.30* 1.47*  GLUCOSE 119* 118* 648*    Electrolytes Recent Labs  Lab 12/18/17 0500 12/19/17 0557 12/20/17 0415  CALCIUM 8.1* 8.1* 8.2*  MG  --  2.1 2.4  PHOS  --   --  5.9*    CBC Recent Labs  Lab 12/18/17 0500 12/19/17 0557 12/20/17 0415  WBC 10.9* 11.0* 18.6*  HGB 8.5* 8.5* 9.0*  HCT 28.1* 28.0* 28.2*  PLT 581* 509* 558*    Coag's No results for input(s): APTT, INR in the last 168 hours.  Sepsis Markers Recent Labs  Lab 12/15/17 1546  LATICACIDVEN 1.2    ABG No results for input(s): PHART, PCO2ART, PO2ART in the last 168 hours.  Liver Enzymes Recent Labs  Lab 12/20/17 0415  AST 17  ALT 14  ALKPHOS 72  BILITOT 0.4  ALBUMIN 2.0*    Cardiac Enzymes Recent Labs  Lab 12/15/17 1909 12/16/17 0019 12/16/17 0619  TROPONINI 0.21* 0.11* 0.06*    Glucose Recent Labs  Lab 12/19/17 1803 12/19/17 2308  GLUCAP 120* 128*    Imaging Dg Abd 1 View  Result Date: 12/20/2017 CLINICAL DATA:  Nasogastric tube placement. EXAM: ABDOMEN - 1 VIEW COMPARISON:  Abdominal radiograph performed  12/18/2017 FINDINGS: The patient's enteric tube is noted ending overlying the body of the stomach. The visualized bowel gas pattern is grossly unremarkable, though difficult to fully assess due to motion artifact. No acute osseous abnormalities are seen. IMPRESSION: Enteric tube noted ending overlying the body of the stomach. Electronically Signed   By: Garald Balding M.D.   On: 12/20/2017 04:49   Dg Chest Port 1 View  Result Date: 12/20/2017 CLINICAL DATA:  Post intubation. EXAM: PORTABLE CHEST 1 VIEW COMPARISON:  Earlier today at L1 50 hours in FINDINGS: Endotracheal tube borderline low in position, 1.6 cm above carina. Patient rotated right. Right-sided PICC line terminates at low SVC. Nasogastric tube terminates in the distal esophagus, likely looped within. Cardiomegaly accentuated by AP portable technique. No pleural effusion or pneumothorax. Suspect mild pulmonary venous congestion, accentuated by low lung volumes. Left greater than right base airspace disease. IMPRESSION: Endotracheal tube borderline  low in position, 1.6 cm above carina. Consider retraction 2-3 cm. Nasogastric tube within the distal esophagus. Recommend advancement. Slightly worsened aeration with developing pulmonary venous congestion and right base atelectasis. Left base airspace disease remains. These results will be called to the ordering clinician or representative by the Radiologist Assistant, and communication documented in the PACS or zVision Dashboard. Electronically Signed   By: Abigail Miyamoto M.D.   On: 12/20/2017 10:15   Dg Chest Port 1 View  Result Date: 12/20/2017 CLINICAL DATA:  Acute onset of shortness of breath. EXAM: PORTABLE CHEST 1 VIEW COMPARISON:  CT of the chest performed 12/15/2017 FINDINGS: The lungs are well-aerated. Left basilar airspace opacity is compatible with pneumonia. There is no evidence of pleural effusion or pneumothorax. The cardiomediastinal silhouette is borderline enlarged. No acute osseous  abnormalities are seen. A right PICC is noted ending about the distal SVC. IMPRESSION: 1. Left basilar pneumonia noted. 2. Borderline cardiomegaly. Electronically Signed   By: Garald Balding M.D.   On: 12/20/2017 02:24     STUDIES:  CXR shows right mainstem intubation - tube withdrawn. Bilateral interstitial infiltrates consistent with aspiration.   CULTURES: Negative blood cultures on 4/11  ANTIBIOTICS: On Unisyn for aspiration pneumonia.  SIGNIFICANT EVENTS: Intubated for evolving respiratory failure.  LINES/TUBES: Double lumen PICC, Foley, NG. 7.5 ETT  DISCUSSION: Unfortunate woman with advanced cancer and failure to recover from initial debulking surgery. While long-term prognosis is poor the hope would be to get her over the initial complications of the surgery and provide her with some quality of life. The patient and her sister would like aggressive care to continue, so we have proceeded with intubation.  The outcome is not guaranteed and Palliative Care's ongoing involvement to continue an ongoing discussion of goals of care is appreciated.  ASSESSMENT / PLAN:  PULMONARY A: Acute respiratory failure due to aspiration pneumonia requiring pre-emptive intubation. P:   Lung protective ventilation strategy and VAP prevention. Delay extubation until SBO under control as will remain at high risk for aspiration and re-intubation until then.  CARDIOVASCULAR A:  Hypertension and tachycardia following intubation.  P:  Optimize pain control and agitation management.  RENAL A:   Stage III CKD.  Clinically volume overloaded since admission with edema, particularly of lower extremities. This might also be contributing to her high BP, respiratory distress. P:   Trial of diuresis.  GASTROINTESTINAL A:   SBO, conservatively managed with NG suctioning.  High nutritional risk - managed with TPN P:   Continue NGT suctioning. Correct electrolyte abnormalities.  Monitor LFT's while  on TPN.  HEMATOLOGIC A:   Anemia of chronic disease. Reactive thrombocytosis. Malignancy related and post operative PE.  P:  No indications for transfusion. On IV Heparin for management of PE. GFR is adequate for LMWH use. Lovenox 1mg /kg bid. Stop iv heparin.  INFECTIOUS A:   Leukocytosis secondary to aspiration pneumonia.  CT shows bibasilar consolidation. P:   Continue current antibiotics.  ENDOCRINE A:   Remains euglycemic despite TPN P:   Consider addition of insulin into TPN if becomes more hyperglycemic.  NEUROLOGIC A:   No delirium pre-intubation. Some agitation now. P:   RASS goal: -1 to 0 Fentanyl for pain control and intermittent versed as adjunct.   FAMILY  - Updates: sister updated today at bedside and participated in decision to intubate.  - Inter-disciplinary family meet or Palliative Care meeting due by:  day 7.  CODE STATUS: full code. Goals of care to be reassessed on  ongoing basis.  This patient is critically ill due to respiratory failure requiring mechanical ventilation and titration of sedative infusions. Total critical care time excluding separately billable procedures: 60 min.  Pulmonary and [place] Pager: [phone]  12/20/2017, 10:18 AM

## 2017-12-20 NOTE — Progress Notes (Signed)
Inpatient Progress Note: Gyn-Onc  CC:  1. Abdominal pain and AKI on admit 2. Stage IV uterine cancer; suspected rapid progression with reaccumulation of ascites 3. Pulm embolus 12/15/17 4. Afib with RVR 12/15/17 5. Ileus with question of SBO 12/14/17-present 6. Aspiration PNA versus other  Assessment/Plan:  Robyn Guerra  is a 62 y.o.  year old with stage IV carcinosarcoma of the uterus s/p ex lap, TAH, BSO, omentectomy, radical tumor debulking on 11/24/17 now readmitted with postop AKI, and pain.   AKI: baseline creatinine elevated at 1.4 . Holding nephrotoxic agents as able. Monitor.  Anemia: Hb stable. No signs of active bleeding. Defer to Dr. Alvy Bimler.  Ascites: secondary to progressive stage IV uterine carcinosarcoma. S/p paracentesis with limited pain relief. Consider repeat paracentesis if remains symptomatic.  Abdominal pain:  CT abd/pelvis on 12/12/17 to evaluate for occult source of intra-abdominal pathology causing pain did not show apparent source (other than ascites).  Clinically no sign of infection (eg abscess or perforation)  and normal labs and vital signs therefore the suspicion for postop pathologic intraabdominal process is low. Ascites tap with no growth and negative gram stain, some pain relief with initial paracentesis. Lipase normal - no pancreatitis. Lactic acid - normal prior to PE/Afib episode Plain film 4/10 showed no significant obstruction, however has had ileus starting 4/10 Pain improved since admission  Plain film 4/15 showed nonspecific gas pattern. No dilated loops.    Ileus versus partial obstruction. I favor ileus given low volume emesis and no NGT output. Continue TPN until more definitive return of bowel function. NGT for now, but would have low threshold to remove if NGT output <300cc in 24 hours as NGT increases aspiration risk and is of limited therapeutic value in patient without bowel distension or high gastric outputs.  Initial contrast  from CT 4/8 in colon. No worsening on plain films. GI prophylaxis in place TPN continued, given prolonged malnutrition and possible upcoming chemo  Recommend conservative management - nonsurgical   Pulm PNA versus aspiration on chest CT. Primary team has her on Unasyn. Remains afebrile, but increased WBC spike today. Continue supportive care. ?cpap? For respiratory failure. If this is necessary, it is reasonable to d.c. Her NGT to facilitate this.    HPI: Robyn Guerra is a 62 year old nulliparous woman who is seen in consultation at the request of Dr Ihor Dow for high grade endometrial cancer, fibroid uterus.  The patient reports vaginal bleeding for since December, 2017. It is daily but light. She was evaluated with a TVUS on 10/03/16 which showed a uterus with measurements: 14.4 x 5.8 x 9.7 cm. There appears to be a 3.8 x 3.2 x 3.0 cm right posterior submucosal fibroid abutting the endometrial canal. A slightly larger 4.8 x 4.1 x 3.0 cm left posterior subserosal fibroid is also seen. The uterus is retroverted in nature. The endometrium was thickened to 1.8 cm. Vague debris and trace fluid are seen within the lower uterine segment and cervix.    She continued to have vaginal bleeding and a repeat US was performed on 08/19/18 which showed uterine measurements: 14.5 x 10.4 x 10.9 cm. Multiple uterine fibroids are seen. The largest fibroid measuring 4.7 cm, as well as a second fibroid measuring 2.8 cm, are located centrally consistent with submucosal fibroids. The posterior subserosal fibroid is also seen measuring 4.4 cm . These show no significant change compared to prior study. Endometrium thickness could not be visualized due to acoustic shadowing from fibroids  described above.  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   CT abdo/pelvis on 10/31/17 showed a few small less than 1 cm retroperitoneal lymph nodes are seen in the left paraaortic and aortocaval spaces. 10 mm right lower quadrant mesenteric lymph node is seen. No abdominal aortic aneurysm. Aortic atherosclerosis.  Enlarged uterus measuring 19.5 cm in length. Multiple small uterine fibroids are seen, largest measuring approximately 5cm. Diffuse endometrial thickening is seen measuring 24 mm,consistent with history of known endometrial carcinosarcoma. Poorly defined heterogeneously enhancing mass is seen in the right adnexa abutting the uterus, which measures 6.1 x 5.5 cm; suspicious for extra uterine extension of malignancy. No evidence of ascites.  On 11/24/17 she underwent ex lap TAH, BSO, omentectomy and radical tumor debulking.  She did well postoperatively however then subsequently developed severe constipation and bloody stools from her bleeding hemorrhoids.  She held the Lovenox for approximately 2 days on March 30 and April 1 however then resumed it.  She try to avoid Percocet although she does have abdominal pain from her constipation.  She had one episode of emesis on March 28 but none since that time.  She is states she is able to keep down oral agents food and liquids.  She is passing flatus.  She denies fevers.  A small amount of her wound has opened at the inferior aspect.  Labs were checked on 12/21/2017 which revealed a mildly elevated white count 13,000, anemia with a hemoglobin of 10.5, thrombocytosis at 718.  Her creatinine was substantially elevated to 2.6.  Of note her preoperative and postoperative creatinine had been stable at 1.4.  Interval Hx:  CT 12/12/08 showed progression of disease with ascties and liver mets (new) but no obstruction, no free air, no abscesses, no bleeding. Ascites drained 12/13/17 (3 L) with minimal to no pain relief. Gram stain negative.  Pain meds increased on 12/14/17 with limited relief in  upper abdominal pain. Lipase normal (no evidence of pancreatitis). 12/14/17 - reported emesis (however not preceeded with nausea). Very poor appetite. Plain films showed no obstruction 12/15/17 - No additional emesis. Not passing flatus. Very little oral intake. Pain is moderately controlled.  12/16/17 - PE diagnosed 4/11 along with Afib with RVR. Transferred to stepdown. Now on drip for rate control and heparin for PE. Imaging suggesting concern for possible SBO. NGT in place. Difficult to discern documentation of output from overnight. 12/17/17 - Stable plain film. Thinks she may have passed flatus and feels like bowels need to move. No other complaints. 12/18/17 - Had soft BM with flatus. Some urine was unaccounted for (spilled to the chuck) which explains output documentation. 12/19/17 - NGT clamped yesterday mid-day. Had phlegm versus ?other output early this am per her report. States a very small amount. No flatus or BM yesterday or today. 12/20/17: - emesis overnight of 100cc (after clamping x 48 hours with no emesis or residuals). No flatus, no BM. Minimal abdominal pain. Desaturation and increase resp rate overnight. CXR shows persistent left basilar pneumonia. NGT replaced and confirmed within stomach. Continued on TPN. NGT with no output after placement.    Current Meds:   Current Facility-Administered Medications:  .  [EXPIRED] amiodarone (NEXTERONE PREMIX) 360-4.14 MG/200ML-% (1.8 mg/mL) IV infusion, 60 mg/hr, Intravenous, Continuous, Stopped at 12/16/17 0400 **FOLLOWED BY** amiodarone (NEXTERONE PREMIX) 360-4.14 MG/200ML-% (  1.8 mg/mL) IV infusion, 30 mg/hr, Intravenous, Continuous, Elodia Florence., MD, Last Rate: 16.7 mL/hr at 12/20/17 0701, 30 mg/hr at 12/20/17 0701 .  Ampicillin-Sulbactam (UNASYN) 3 g in sodium chloride 0.9 % 100 mL IVPB, 3 g, Intravenous, Q6H, Dessa Phi, DO, Stopped at 12/20/17 0230 .  calcium carbonate (TUMS - dosed in mg elemental calcium) chewable tablet  200 mg of elemental calcium, 1 tablet, Oral, QID PRN, Jani Gravel, MD, 200 mg of elemental calcium at 12/20/17 0229 .  Chlorhexidine Gluconate Cloth 2 % PADS 6 each, 6 each, Topical, Daily, Dessa Phi, DO, 6 each at 12/19/17 1214 .  dextrose 5 %-0.9 % sodium chloride infusion, , Intravenous, Continuous, Minda Ditto, RPH, Last Rate: 30 mL/hr at 12/20/17 0634 .  famotidine (PEPCID) IVPB 20 mg premix, 20 mg, Intravenous, Q12H, Dessa Phi, DO, Stopped at 12/20/17 0100 .  guaiFENesin-dextromethorphan (ROBITUSSIN DM) 100-10 MG/5ML syrup 5 mL, 5 mL, Oral, Q4H PRN, Jani Gravel, MD, 5 mL at 12/19/17 2346 .  heparin ADULT infusion 100 units/mL (25000 units/229mL sodium chloride 0.45%), 1,700 Units/hr, Intravenous, Continuous, Dorrene German, [facility], Last Rate: 17 mL/hr at 12/20/17 0634, 1,700 Units/hr at 12/20/17 0634 .  hydrALAZINE (APRESOLINE) injection 5 mg, 5 mg, Intravenous, Q4H PRN, Dessa Phi, DO .  HYDROmorphone (DILAUDID) injection 1 mg, 1 mg, Intravenous, Q2H PRN, Elodia Florence., MD, 1 mg at 12/20/17 0309 .  lidocaine (XYLOCAINE) 1 % (with pres) injection, , , PRN, Allred, Darrell K, PA-C, 10 mL at 12/15/17 1141 .  LORazepam (ATIVAN) injection 0.5 mg, 0.5 mg, Intravenous, Q6H PRN, Elodia Florence., MD, 0.5 mg at 12/19/17 2257 .  [DISCONTINUED] ondansetron (ZOFRAN) tablet 4 mg, 4 mg, Oral, Q6H PRN, 4 mg at 12/10/17 0116 **OR** ondansetron (ZOFRAN) injection 4 mg, 4 mg, Intravenous, Q6H PRN, Elodia Florence., MD, 4 mg at 12/14/17 1712 .  phenol (CHLORASEPTIC) mouth spray 1 spray, 1 spray, Mouth/Throat, PRN, Dessa Phi, DO, 1 spray at 12/19/17 2346 .  sodium chloride flush (NS) 0.9 % injection 10-40 mL, 10-40 mL, Intracatheter, Q12H, Dessa Phi, DO, 10 mL at 12/20/17 0410 .  sodium chloride flush (NS) 0.9 % injection 10-40 mL, 10-40 mL, Intracatheter, PRN, Dessa Phi, DO .  TPN (CLINIMIX-E) Adult, , Intravenous, Continuous TPN, Last Rate: 40 mL/hr at 12/20/17  0634 **AND** [EXPIRED] Fat emulsion 20 % infusion 240 mL, 240 mL, Intravenous, Continuous TPN, Minda Ditto, RPH, Stopped at 12/20/17 0536 .  [START ON 12/21/2017] vancomycin (VANCOCIN) IVPB 1000 mg/200 mL premix, 1,000 mg, Intravenous, Q24H, Dorrene German, [facility]  No facility-administered encounter medications on file as of 11/01/2017.      Labs:  CBC    Component Value Date/Time   WBC 18.6 (H) 12/20/2017 0415   RBC 3.68 (L) 12/20/2017 0415   HGB 9.0 (L) 12/20/2017 0415   HCT 28.2 (L) 12/20/2017 0415   PLT 558 (H) 12/20/2017 0415   MCV 76.6 (L) 12/20/2017 0415   MCH 24.5 (L) 12/20/2017 0415   MCHC 31.9 12/20/2017 0415   RDW 19.9 (H) 12/20/2017 0415   LYMPHSABS 1.1 12/20/2017 0415   MONOABS 1.3 (H) 12/20/2017 0415   EOSABS 0.2 12/20/2017 0415   BASOSABS 0.0 12/20/2017 0415   BMET    Component Value Date/Time   NA 141 12/19/2017 0557   NA 140 10/27/2017 1423   K 3.7 12/19/2017 0557   CL 113 (H) 12/19/2017 0557   CO2 20 (L) 12/19/2017 0557   GLUCOSE 118 (H)  12/19/2017 0557   BUN 11 12/19/2017 0557   BUN 14 10/27/2017 1423   CREATININE 1.30 (H) 12/19/2017 0557   CALCIUM 8.1 (L) 12/19/2017 0557   GFRNONAA 44 (L) 12/19/2017 0557   GFRAA 51 (L) 12/19/2017 0557     Vitals:  Blood pressure (!) 164/121, pulse (!) 103, temperature 98 F (36.7 C), temperature source Oral, resp. rate (!) 38, height 5\' 4"  (1.626 m), weight 266 lb 15.6 oz (121.1 kg), last menstrual period 04/20/2016, SpO2 99 %.  I/O last 3 completed shifts: In: 4513.5 [I.V.:3813.5; IV Piggyback:700] Out: 750 [Urine:750] No intake/output data recorded.   Physical Exam: O2sat while I am present is 97% but clearly increased work of breathing and does appear tachypneic (RR 30) WD in NAD, in mild respiratory distress; Lungs CTA upper field, fair aeration in bilateral bases. I cannot appreciate added sounds of pneumonia (present on plain film) CV - RRR Abdo - Minimal BS compared to yesterday. Soft, NTND. No  rebound or guarding. Ext - minimal pitting edema. NT.    Thereasa Solo, MD  12/20/2017, 7:36 AM

## 2017-12-20 NOTE — Progress Notes (Signed)
PROGRESS NOTE    Robyn Guerra  ZTI:[id number] DOB: 1955-11-25 DOA: 01/02/2018 PCP: Lin Landsman, MD     Brief Narrative:  Robyn Guerra is a 62 y.o. female with medical history significant of HTN, iron deficiency anemia, andstage IVcarcinosarcoma of the uterus s/p ex lap, TAH, BSO, omentectomy, radical tumor debulking on 11/24/17 who represented on 4/5 with post op AKI, constipation and pain. She was seen by Dr. Alvy Bimler on 4/5 who noted acute kidney injury as well as the abdominal discomfort and concern for constipation as well.  She was readmitted by Dr. Denman George given her recent surgery. She was admitted for IV fluids and symptom management.  She had a paracentesis on 4/9 with cytology c/w metastatic adenocarcinoma.  CT abdomen pelvis from 4/8 with new R hepatic lobe lesions worrisome for metastatic disease.  Concern that cause of progressive symptoms is progressive cancer.  Dr. Alvy Bimler planning for palliative chemotherapy.  Hospitalization further complicated by pulmonary embolism, atrial fibrillation with RVR, elevated troponin, small bowel obstruction and aspiration pneumonia.  TRH consulted on 4/11 as primary team for above issues. She was initially improving with conservative measures, but overnight 4/15-4/16, her NGT was removed while coughing and she subsequently started to vomit, aspirated, and developed respiratory distress.   Assessment & Plan:   Principal Problem:   Acute prerenal failure ([place]) Active Problems:   Endometrial cancer ([place])   Morbid obesity with BMI of 45.0-49.9, adult (HCC)   Secondary malignant neoplasm of omentum (HCC)   Peritoneal carcinomatosis ([place])   Iron deficiency anemia due to chronic blood loss   Wound dehiscence, surgical, initial encounter   Abdominal pain   Other constipation   Renal failure, acute ([place])   Renal failure   Malnutrition of moderate degree   Acute hypoxemic respiratory failure -Removed NGT 4/15 during cough, subsequently  aspirated and developed respiratory distress. Currently tachypneic and on HFNC O2 -Discussed code status with patient and sister; patient wishes to be FULL CODE despite her poor prognosis and knowing that chemotherapy may not be an option for her -PCCM consulted this morning  Acute pulmonary embolism -CT chest showed proximal right upper lobe pulmonary embolism -Echo with reduced RV function -DVT US duplex negative for DVT -Heparin gtt  Atrial Fibrillation with RVR -Converted to normal sinus rhythm. Stop IV cardizem. Remains on amiodarone gtt while NPO   Elevated Troponin -Due to demand with PE and A Fib RVR -Troponin 0.21 --> 0.06  SBO   -CT A/P showed dilated loops of proximal and mid small bowel -NGT 261mL output yesterday, had BM yesterday as well  -Per gyn onc team, conservative management for now  -Start TPN to boost nutritional status. Pharmacy consulted  -NGT clamped over the weekend, then accidentally removed overnight and developed nausea, vomiting. NGT replaced. 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 Alvy Bimler following, unclear if she will be a candidate for chemotherapy -Palliative  consulted for GOC and pain management  Cancer associated pain -Pain control medications prn   AKI -Baseline Cr  1.2-1.3 -IVF, resolved, back to baseline   Chronic microcytic anemia, thrombocytosis -Monitor CBC    DVT prophylaxis: Heparin gtt Code Status: Full Family Communication: Sister at bedside  Disposition Plan: PCCM consulted due to clinical decline    Consultants:   Gyn oncology  Oncology  PCCM   Antimicrobials:  Anti-infectives (From admission, onward)   Start     Dose/Rate Route Frequency Ordered Stop   12/21/17 0800  vancomycin (VANCOCIN) IVPB 1000 mg/200 mL premix     1,000 mg 200 mL/hr over 60 Minutes Intravenous Every 24 hours 12/20/17 0428     12/20/17 0345  vancomycin (VANCOCIN) 2,500 mg in sodium chloride 0.9 % 500 mL IVPB     2,500 mg 250 mL/hr over 120 Minutes Intravenous  Once 12/20/17 0331 12/20/17 0610   12/15/17 1400  Ampicillin-Sulbactam (UNASYN) 3 g in sodium chloride 0.9 % 100 mL IVPB     3 g 200 mL/hr over 30 Minutes Intravenous Every 6 hours 12/15/17 1239 12/22/17 1359       Subjective: Feeling poorly. Complains of L sided abdominal pain and shortness of breath.   Objective: Vitals:   12/20/17 0600 12/20/17 0700 12/20/17 0740 12/20/17 0800  BP: (!) 164/121 (!) 143/57  (!) 181/107  Pulse: (!) 103 (!) 106  (!) 110  Resp: (!) 38 (!) 38    Temp:   98.6 F (37 C)   TempSrc:   Oral   SpO2: 99% 100%  100%  Weight:      Height:        Intake/Output Summary (Last 24 hours) at 12/20/2017 0850 Last data filed at 12/20/2017 0634 Gross per 24 hour  Intake 2588.39 ml  Output 750 ml  Net 1838.39 ml   Filed Weights   12/16/17 0316 12/19/17 1134 12/20/17 0250  Weight: 117.9 kg (259 lb 14.8 oz) 124.8 kg (275 lb 2.2 oz) 121.1 kg (266 lb 15.6 oz)   Examination: General exam: Appears anxious, in distress Respiratory system: Coarse to auscultation. Respiratory effort increased with tachypenia. Cardiovascular system: S1 & S2 heard, tachycardic, regular rhythm. No JVD, murmurs, rubs, gallops or clicks.  Gastrointestinal system: Abdomen is nondistended, soft  and TTP LUQ. No organomegaly or masses felt. No bowel sounds heard. Central nervous system: Alert and oriented. No focal neurological deficits. Extremities: Symmetric in appearance  Skin: No rashes, lesions or ulcers Psychiatry: Judgement and insight appear normal. Mood & affect appropriate.    Data Reviewed: I have personally reviewed following labs and imaging studies  CBC: Recent Labs  Lab 12/16/17 0619 12/17/17 0647 12/18/17 0500 12/19/17 0557 12/20/17 0415  WBC 12.3* 10.4 10.9* 11.0* 18.6*  NEUTROABS  --   --   --   --  16.0*  HGB 8.3* 8.5* 8.5* 8.5* 9.0*  HCT 27.2* 28.2* 28.1* 28.0* 28.2*  MCV 74.5* 75.0* 74.9* 74.9* 76.6*  PLT 615* 562* 581* 509* 709*   Basic Metabolic Panel: Recent Labs  Lab 12/15/17 0413 12/16/17 0650 12/17/17 0647 12/18/17 0500 12/19/17 0557  NA 140 140 141 142 141  K 4.1 4.2 3.7 3.6 3.7  CL 106 109 111 110 113*  CO2 24 23 21* 22 20*  GLUCOSE 159* 136* 126* 119* 118*  BUN 19 23* 18 13 11   CREATININE 1.28* 1.55* 1.26* 1.24* 1.30*  CALCIUM 8.9 8.3* 8.2* 8.1* 8.1*  MG  --   --   --   --  2.1   GFR: Estimated Creatinine Clearance: 59.1 mL/min (A) (by C-G formula based on SCr of 1.3 mg/dL (H)). Liver Function Tests: No results for input(s): AST, ALT, ALKPHOS, BILITOT, PROT, ALBUMIN in the last 168 hours. Recent Labs  Lab 12/14/17 0411  LIPASE 16   No results for input(s): AMMONIA in the last 168 hours. Coagulation Profile: No results for input(s): INR, PROTIME in the last 168 hours. Cardiac Enzymes: Recent Labs  Lab 12/15/17 1909 12/16/17 0019 12/16/17 0619  TROPONINI 0.21* 0.11* 0.06*   BNP (last 3 results) No results for input(s): PROBNP in the last 8760 hours. HbA1C: No results for input(s): HGBA1C in the last 72 hours. CBG: Recent Labs  Lab 12/19/17 1803 12/19/17 2308  GLUCAP 120* 128*   Lipid Profile: Recent Labs    12/20/17 0416  TRIG 1,552*   Thyroid Function Tests: No results for input(s): TSH, T4TOTAL,  FREET4, T3FREE, THYROIDAB in the last 72 hours. Anemia Panel: No results for input(s): VITAMINB12, FOLATE, FERRITIN, TIBC, IRON, RETICCTPCT in the last 72 hours. Sepsis Labs: Recent Labs  Lab 12/15/17 1546  LATICACIDVEN 1.2    Recent Results (from the past 240 hour(s))  Culture, body fluid-bottle     Status: None   Collection Time: 12/13/17 10:00 AM  Result Value Ref Range Status   Specimen Description FLUID PERITONEAL  Final   Special Requests NONE  Final   Culture   Final    NO GROWTH 5 DAYS Performed at [facility] [address] [place], [place] [address]    Report Status 12/18/2017 FINAL  Final  Gram stain     Status: None   Collection Time: 12/13/17 10:00 AM  Result Value Ref Range Status   Specimen Description FLUID PERITONEAL  Final   Special Requests NONE  Final   Gram Stain   Final    FEW WBC PRESENT, PREDOMINANTLY PMN NO ORGANISMS SEEN Performed at [facility], [place] [address], [place], [place] [address]    Report Status 12/13/2017 FINAL  Final  Culture, blood (routine x 2)     Status: None (Preliminary result)   Collection Time: 12/15/17  1:38 PM  Result Value Ref Range Status   Specimen Description   Final    BLOOD LEFT HAND Performed at [facility] [address], [place], [place] [address]    Special Requests   Final    BOTTLES DRAWN AEROBIC ONLY Blood Culture adequate volume   Culture   Final    NO GROWTH 4 DAYS Performed at [facility], [place] [address], [place], [place] [address]    Report Status PENDING  Incomplete  Culture, blood (routine x 2)     Status: None (Preliminary result)   Collection Time: 12/15/17  1:38 PM  Result Value Ref Range Status   Specimen Description   Final    BLOOD RIGHT HAND Performed at [facility] [address], [place], [place] [address]    Special Requests   Final    BOTTLES DRAWN AEROBIC ONLY Blood Culture adequate volume   Culture   Final    NO GROWTH  4 DAYS Performed at [facility], [place] [address], [place], [place] [address]    Report Status PENDING  Incomplete  MRSA PCR Screening     Status: None   Collection Time: 12/15/17  7:13 PM  Result Value Ref Range Status   MRSA by PCR NEGATIVE NEGATIVE Final  Comment:        The GeneXpert MRSA Assay (FDA approved for NASAL specimens only), is one component of a comprehensive MRSA colonization surveillance program. It is not intended to diagnose MRSA infection nor to guide or monitor treatment for MRSA infections. Performed at [facility], [place] [address] [place], [place] [address]        Radiology Studies: Dg Abd 1 View  Result Date: 12/20/2017 CLINICAL DATA:  Nasogastric tube placement. EXAM: ABDOMEN - 1 VIEW COMPARISON:  Abdominal radiograph performed 12/18/2017 FINDINGS: The patient's enteric tube is noted ending overlying the body of the stomach. The visualized bowel gas pattern is grossly unremarkable, though difficult to fully assess due to motion artifact. No acute osseous abnormalities are seen. IMPRESSION: Enteric tube noted ending overlying the body of the stomach. Electronically Signed   By: Garald Balding M.D.   On: 12/20/2017 04:49   Dg Chest Port 1 View  Result Date: 12/20/2017 CLINICAL DATA:  Acute onset of shortness of breath. EXAM: PORTABLE CHEST 1 VIEW COMPARISON:  CT of the chest performed 12/15/2017 FINDINGS: The lungs are well-aerated. Left basilar airspace opacity is compatible with pneumonia. There is no evidence of pleural effusion or pneumothorax. The cardiomediastinal silhouette is borderline enlarged. No acute osseous abnormalities are seen. A right PICC is noted ending about the distal SVC. IMPRESSION: 1. Left basilar pneumonia noted. 2. Borderline cardiomegaly. Electronically Signed   By: Garald Balding M.D.   On: 12/20/2017 02:24      Scheduled Meds: . Chlorhexidine Gluconate Cloth  6 each Topical Daily  . sodium chloride  flush  10-40 mL Intracatheter Q12H   Continuous Infusions: . amiodarone 30 mg/hr (12/20/17 0701)  . ampicillin-sulbactam (UNASYN) IV 3 g (12/20/17 0814)  . dextrose 5 % and 0.9% NaCl 30 mL/hr at 12/20/17 0634  . famotidine (PEPCID) IV Stopped (12/20/17 0100)  . heparin 1,700 Units/hr (12/20/17 6599)  . Marland KitchenTPN (CLINIMIX-E) Adult 40 mL/hr at 12/20/17 0634  . [START ON 12/21/2017] vancomycin       LOS: 8 days    Total critical care time: 49 minutes. Time 8am-8:49am Critical care time was exclusive of separately billable procedures and treating other patients. Critical care was necessary to treat or prevent imminent or life-threatening deterioration. Critical care was time spent personally by me on the following activities: development of treatment plan with patient and/or surrogate as well as nursing, discussions with consultants, evaluation of patient's response to treatment, examination of patient, obtaining history from patient or surrogate, ordering and performing treatments and interventions, ordering and review of laboratory studies, ordering and review of radiographic studies, pulse oximetry and re-evaluation of patient's condition.    Dessa Phi, DO [facility] [email or web address] Password [email or web address] 12/20/2017, 8:50 AM

## 2017-12-20 NOTE — Progress Notes (Addendum)
Pharmacy Antibiotic Note  Robyn Guerra is a 62 y.o. female admitted on 12/28/2017 with pneumonia.  Pharmacy has been consulted for vancomycin dosing.  Plan: Vancomycin 2500 mg x1 then 1 Gm IV q24h for est AUC =512 Goal AUC = 400-500 F/u scr/cultures/levels Unasyn 3 Gm IV q6h (MD for asp PNA)  Height: 5\' 4"  (162.6 cm) Weight: 268 lb 8.3 oz (121.8 kg) IBW/kg (Calculated) : 54.7  Temp (24hrs), Avg:98.5 F (36.9 C), Min:98.4 F (36.9 C), Max:98.6 F (37 C)  Recent Labs  Lab 12/15/17 0413 12/15/17 1546 12/16/17 0619 12/16/17 0650 12/17/17 0647 12/18/17 0500 12/19/17 0557  WBC 12.6*  --  12.3*  --  10.4 10.9* 11.0*  CREATININE 1.28*  --   --  1.55* 1.26* 1.24* 1.30*  LATICACIDVEN  --  1.2  --   --   --   --   --     Estimated Creatinine Clearance: 59.2 mL/min (A) (by C-G formula based on SCr of 1.3 mg/dL (H)).    Allergies  Allergen Reactions  . Codeine Nausea And Vomiting    Room spins    Antimicrobials this admission: 4/11 unasyn >> 4/18 planned for 7 days 4/16 vancomycin >>   Dose adjustments this admission:   Microbiology results:  BCx:   UCx:    Sputum:    MRSA PCR:   Thank you for allowing pharmacy to be a part of this patient's care.  Dorrene German 12/20/2017 3:43 AM

## 2017-12-20 NOTE — Progress Notes (Signed)
RN stated that she completed CPT via bed for 1600 round.

## 2017-12-20 NOTE — Progress Notes (Signed)
ANTICOAGULATION CONSULT NOTE - [facility] for heparin Indication: pulmonary embolus  Allergies  Allergen Reactions  . Codeine Nausea And Vomiting    Room spins   Patient Measurements: Height: 5\' 4"  (162.6 cm) Weight: 266 lb 15.6 oz (121.1 kg) IBW/kg (Calculated) : 54.7 Heparin Dosing Weight: 81kg  Vital Signs: Temp: 99.4 F (37.4 C) (04/16 1140) Temp Source: Axillary (04/16 1140) BP: 88/58 (04/16 1300) Pulse Rate: 92 (04/16 1300)  Labs: Recent Labs    12/18/17 0500 12/19/17 0557 12/19/17 0857 12/20/17 0415 12/20/17 0416 12/20/17 1300  HGB 8.5* 8.5*  --  9.0*  --   --   HCT 28.1* 28.0*  --  28.2*  --   --   PLT 581* 509*  --  558*  --   --   HEPARINUNFRC 0.54 >2.20* 0.41  --  0.10* 0.32  CREATININE 1.24* 1.30*  --  1.47*  --   --    Estimated Creatinine Clearance: 52.2 mL/min (A) (by C-G formula based on SCr of 1.47 mg/dL (H)).  Medical History: Past Medical History:  Diagnosis Date  . Cancer [facility])    endometrial cancer  . Fibroids   . GERD (gastroesophageal reflux disease)   . Hypertension   . Sinusitis    Assessment: 64 YOF with stage IV uterine cancer.  She underwent ex lap with TAH, BSO, omentectomy and debulking on 3/21.  CT chest re-reviewed and determined to have RUL PE.   Pharmacy asked to dose heparin gtt.    Today, 12/20/2017  0416 HL = 0.10 - per RN heparin is now running in peripheral site and HL was drawn from PICC. RN confirmed heparin was not leaking and site was good.  1300 Hep level = 0.32 units/ml on 1700 units/hr  Previous levels maintained in range, not sure why level in now decreased.  CBC: Hgb low but stable. Plts stable  Renal: SCr 1.30 improving, per notes baseline SCr 1.4  No line or bleeding issues per RN  Goal of Therapy:  Heparin level 0.3-0.7 units/ml Monitor platelets by anticoagulation protocol: Yes   Plan:   Increase Heparin infusion to 1800 units/hr  Recheck Heparin level with am  labs   Await plan for long-term anticoagulation (LMWH vs DOAC)   Monitor for signs and symptoms of bleeding   Daily heparin level and CBC  Minda Ditto PharmD Pager [phone] 12/20/2017, 1:56 PM

## 2017-12-20 NOTE — Progress Notes (Signed)
PMT no charge note  Events from earlier today noted.  Patient seen briefly, now intubated. No family present at bedside.   Recurrent uterine cancer, liver mets. Now with VDRF, A fib with RVR, PE, AKI.  Patient with high risk for ongoing decline and decompensation. PMT will continue to follow.   Loistine Chance MD [facility] [phone]

## 2017-12-20 NOTE — Progress Notes (Addendum)
12/20/14 2339 Patient reports coughing with copious yellow secretions, noted NG is completely out of nares at this time, NG has been clamped for past 24 hours without residual noted. Notified Dr. Maudie Mercury that patient does not wish to have the NG replaced tonight. New orders for Robitussin PRN.   0130 Notified Dr.Kim of respiratory changes, respiration rate of 30-45, Sat's at 89-90,  increased oxygen therapy from 3 liters to 6, change of breath sounds with Rhonchi  and expiratory wheezes.  0200 Dr Maudie Mercury at bedside.   0230 CXR complete, notified Dr.Kim new orders noted and received.   0330 Patient vomited 100 ml of brown emesis, NG replaced to Left nares. Patient desating at 82-86 on 6 liters nasal canal, HFNC at this time, Sat's 96 %.

## 2017-12-21 ENCOUNTER — Inpatient Hospital Stay (HOSPITAL_COMMUNITY): Payer: BLUE CROSS/BLUE SHIELD

## 2017-12-21 LAB — GLUCOSE, CAPILLARY
GLUCOSE-CAPILLARY: 125 mg/dL — AB (ref 65–99)
GLUCOSE-CAPILLARY: 167 mg/dL — AB (ref 65–99)
Glucose-Capillary: 114 mg/dL — ABNORMAL HIGH (ref 65–99)
Glucose-Capillary: 117 mg/dL — ABNORMAL HIGH (ref 65–99)
Glucose-Capillary: 129 mg/dL — ABNORMAL HIGH (ref 65–99)

## 2017-12-21 LAB — BASIC METABOLIC PANEL
ANION GAP: 11 (ref 5–15)
BUN: 19 mg/dL (ref 6–20)
CO2: 19 mmol/L — AB (ref 22–32)
CREATININE: 1.96 mg/dL — AB (ref 0.44–1.00)
Calcium: 8.2 mg/dL — ABNORMAL LOW (ref 8.9–10.3)
Chloride: 112 mmol/L — ABNORMAL HIGH (ref 101–111)
GFR calc Af Amer: 31 mL/min — ABNORMAL LOW (ref >60)
GFR calc non Af Amer: 27 mL/min — ABNORMAL LOW (ref >60)
GLUCOSE: 148 mg/dL — AB (ref 65–99)
Potassium: 3.7 mmol/L (ref 3.5–5.1)
Sodium: 142 mmol/L (ref 135–145)

## 2017-12-21 LAB — CBC
HCT: 26.3 % — ABNORMAL LOW (ref 36.0–46.0)
HEMOGLOBIN: 8 g/dL — AB (ref 12.0–15.0)
MCH: 22.5 pg — ABNORMAL LOW (ref 26.0–34.0)
MCHC: 30.4 g/dL (ref 30.0–36.0)
MCV: 74.1 fL — ABNORMAL LOW (ref 78.0–100.0)
Platelets: 469 10*3/uL — ABNORMAL HIGH (ref 150–400)
RBC: 3.55 MIL/uL — AB (ref 3.87–5.11)
RDW: 19.4 % — ABNORMAL HIGH (ref 11.5–15.5)
WBC: 18.6 10*3/uL — AB (ref 4.0–10.5)

## 2017-12-21 LAB — MAGNESIUM: Magnesium: 2.2 mg/dL (ref 1.7–2.4)

## 2017-12-21 LAB — TRIGLYCERIDES: TRIGLYCERIDES: 119 mg/dL (ref <150)

## 2017-12-21 LAB — HEPARIN LEVEL (UNFRACTIONATED): Heparin Unfractionated: 0.39 IU/mL (ref 0.30–0.70)

## 2017-12-21 LAB — PHOSPHORUS: PHOSPHORUS: 3.7 mg/dL (ref 2.5–4.6)

## 2017-12-21 MED ORDER — PROPOFOL 1000 MG/100ML IV EMUL
INTRAVENOUS | Status: AC
  Filled 2017-12-21: qty 100

## 2017-12-21 MED ORDER — FAT EMULSION PLANT BASED 20 % IV EMUL
240.0000 mL | INTRAVENOUS | Status: DC
  Administered 2017-12-21: 240 mL via INTRAVENOUS
  Filled 2017-12-21: qty 250

## 2017-12-21 MED ORDER — PROPOFOL 1000 MG/100ML IV EMUL
5.0000 ug/kg/min | INTRAVENOUS | Status: DC
  Administered 2017-12-21: 5 ug/kg/min via INTRAVENOUS
  Administered 2017-12-22: 30 ug/kg/min via INTRAVENOUS
  Administered 2017-12-22: 10 ug/kg/min via INTRAVENOUS
  Administered 2017-12-22 – 2017-12-23 (×2): 30 ug/kg/min via INTRAVENOUS
  Administered 2017-12-23 – 2017-12-24 (×2): 10 ug/kg/min via INTRAVENOUS
  Filled 2017-12-21 (×10): qty 100

## 2017-12-21 MED ORDER — TRACE MINERALS CR-CU-MN-SE-ZN 10-1000-500-60 MCG/ML IV SOLN
INTRAVENOUS | Status: AC
  Administered 2017-12-21: 17:00:00 via INTRAVENOUS
  Filled 2017-12-21: qty 960

## 2017-12-21 MED ORDER — SODIUM BICARBONATE 8.4 % IV SOLN
INTRAVENOUS | Status: DC
  Administered 2017-12-21 – 2017-12-23 (×3): via INTRAVENOUS
  Filled 2017-12-21 (×4): qty 150

## 2017-12-21 NOTE — Progress Notes (Signed)
PT Cancellation Note  Patient Details Name: Robyn Guerra MRN: [id number] DOB: Feb 03, 1956   Cancelled Treatment:    Reason Eval/Treat Not Completed: Medical issues which prohibited therapy(pt on ventilator. Will follow. )   Philomena Doheny 12/21/2017, 11:14 AM [phone]

## 2017-12-21 NOTE — Progress Notes (Signed)
Robyn Guerra is a 62 y.o. female patient. 1. Acute prerenal failure ([place])   2. Acute renal failure with tubular necrosis (HCC)   3. Other constipation   4. Malignant ascites   5. Pain of upper abdomen   6. Emesis   7. Uterine cancer ([place])   8. Abdominal pain   9. SBO (small bowel obstruction) ([place])   10. Small bowel obstruction ([place])   11. Hypoxia   12. Encounter for orogastric (OG) tube placement   13. Intubation of airway performed without difficulty   14. Encounter for imaging study to confirm orogastric (OG) tube placement    Past Medical History:  Diagnosis Date  . Cancer [facility])    endometrial cancer  . Fibroids   . GERD (gastroesophageal reflux disease)   . Hypertension   . Sinusitis    Current Facility-Administered Medications  Medication Dose Route Frequency Provider Last Rate Last Dose  . albuterol (PROVENTIL) (2.5 MG/3ML) 0.083% nebulizer solution 2.5 mg  2.5 mg Nebulization Q4H PRN Agarwala, Einar Grad, MD      . amiodarone (NEXTERONE PREMIX) 360-4.14 MG/200ML-% (1.8 mg/mL) IV infusion  30 mg/hr Intravenous Continuous Elodia Florence., MD   Stopped at 12/20/17 1327  . Ampicillin-Sulbactam (UNASYN) 3 g in sodium chloride 0.9 % 100 mL IVPB  3 g Intravenous Q6H Dessa Phi, DO   Stopped at 12/21/17 [phone]  . calcium carbonate (TUMS - dosed in mg elemental calcium) chewable tablet 200 mg of elemental calcium  1 tablet Oral QID PRN Jani Gravel, MD   200 mg of elemental calcium at 12/20/17 0229  . Chlorhexidine Gluconate Cloth 2 % PADS 6 each  6 each Topical Daily Dessa Phi, DO   6 each at 12/19/17 1214  . dexmedetomidine (PRECEDEX) 200 MCG/50ML (4 mcg/mL) infusion  0.4-1.2 mcg/kg/hr Intravenous Titrated Kipp Brood, MD 12.1 mL/hr at 12/21/17 0743 0.4 mcg/kg/hr at 12/21/17 0743  . dextrose 5 %-0.9 % sodium chloride infusion   Intravenous Continuous Minda Ditto, RPH 30 mL/hr at 12/21/17 0742    . famotidine (PEPCID) IVPB 20 mg premix  20 mg Intravenous Q12H Dessa Phi, DO 100 mL/hr at 12/21/17 0956 20 mg at 12/21/17 0956  . fentaNYL (SUBLIMAZE) bolus via infusion 50 mcg  50 mcg Intravenous Q1H PRN Kipp Brood, MD   50 mcg at 12/21/17 0040  . fentaNYL 252mcg in NS 262mL (70mcg/ml) infusion-PREMIX  25-400 mcg/hr Intravenous Continuous Dessa Phi, DO 7.5 mL/hr at 12/21/17 0747 75 mcg/hr at 12/21/17 0747  . guaiFENesin-dextromethorphan (ROBITUSSIN DM) 100-10 MG/5ML syrup 5 mL  5 mL Oral Q4H PRN Jani Gravel, MD   5 mL at 12/19/17 2346  . heparin ADULT infusion 100 units/mL (25000 units/268mL sodium chloride 0.45%)  1,800 Units/hr Intravenous Continuous Minda Ditto, RPH 18 mL/hr at 12/21/17 0956 1,800 Units/hr at 12/21/17 0956  . hydrALAZINE (APRESOLINE) injection 5 mg  5 mg Intravenous Q4H PRN Dessa Phi, DO      . insulin aspart (novoLOG) injection 0-9 Units  0-9 Units Subcutaneous Q6H Minda Ditto, RPH   2 Units at 12/21/17 2993  . lidocaine (XYLOCAINE) 1 % (with pres) injection    PRN Allred, Darrell K, PA-C   10 mL at 12/15/17 1141  . LORazepam (ATIVAN) injection 0.5 mg  0.5 mg Intravenous Q6H PRN Elodia Florence., MD   0.5 mg at 12/20/17 1152  . midazolam (VERSED) injection 2 mg  2 mg Intravenous Q1H PRN Kipp Brood, MD   1 mg at  12/21/17 0207  . ondansetron (ZOFRAN) injection 4 mg  4 mg Intravenous Q6H PRN Elodia Florence., MD   4 mg at 12/14/17 1712  . phenol (CHLORASEPTIC) mouth spray 1 spray  1 spray Mouth/Throat PRN Dessa Phi, DO   1 spray at 12/19/17 2346  . sodium chloride flush (NS) 0.9 % injection 10-40 mL  10-40 mL Intracatheter Q12H Dessa Phi, DO   10 mL at 12/21/17 1006  . sodium chloride flush (NS) 0.9 % injection 10-40 mL  10-40 mL Intracatheter PRN Dessa Phi, DO      . TPN (CLINIMIX-E) Adult   Intravenous Continuous TPN Minda Ditto, RPH 40 mL/hr at 12/21/17 0600     Allergies  Allergen Reactions  . Codeine Nausea And Vomiting    Room spins   Principal Problem:   Acute prerenal failure  (HCC) Active Problems:   Endometrial cancer (HCC)   Morbid obesity with BMI of 45.0-49.9, adult (HCC)   Secondary malignant neoplasm of omentum (HCC)   Peritoneal carcinomatosis (HCC)   Iron deficiency anemia due to chronic blood loss   Wound dehiscence, surgical, initial encounter   Abdominal pain   Other constipation   Renal failure, acute (HCC)   Renal failure   Malnutrition of moderate degree  Blood pressure (!) 124/57, pulse 64, temperature 98.4 F (36.9 C), resp. rate (!) 42, height 5\' 4"  (1.626 m), weight 266 lb 15.6 oz (121.1 kg), last menstrual period 04/20/2016, SpO2 96 %.  Subjective Objective: General Appearance:  General patient appearance: Responsive.   Vital signs: (most recent): Blood pressure (!) 124/57, pulse 64, temperature 98.4 F (36.9 C), resp. rate (!) 42, height 5\' 4"  (1.626 m), weight 266 lb 15.6 oz (121.1 kg), last menstrual period 04/20/2016, SpO2 96 %.    Assessment & Plan Robyn Guerra  is a 62 y.o.  year old with stage IV carcinosarcoma of the uterus s/p ex lap, TAH, BSO, omentectomy, radical tumor debulking on 11/24/17 readmitted with a prolonged postoperative ileus, PE, Afib, AKI and aspiration PNA. She is now felt to have multisystem organ failure.  Medical Oncology note appreciated.  Will continue to follow with you.   [name] 12/21/2017

## 2017-12-21 NOTE — Progress Notes (Signed)
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  Her sister, who is her medical healthcare power of attorney, agreed with the plan of care  Atrial fibrillation with rapid ventricular response, resolved Could be triggered by pneumonia/pulmonary emboli She is being managed medically  Pulmonary emboli Secondary to malignancy Continue anticoagulation therapy  Aspiration pneumonia secondary to peritoneal carcinomatosis with respiratory failure She is on antibiotics treatment and ventilation support  Malignant ascites, nausea, changes in bowel habits, likely due to peritoneal carcinomatosis She have signs and symptoms of peritoneal carcinomatosis She has lost a lot of weight due to persistent nausea She will continue NG tube decompression  Recurrent renal failuresecondary topoor oral intake Malignant cachexia Moderate protein calorie malnutrition She has responded to IV fluid resuscitation  Goals of care discussion I have extensive discussion of goals of care with the sister She will likely transition her care to comfort measures soon I am supportive of her decision about the plan of care  Discharge planning Likely terminal extubation over the next 24-48 hours  Heath Lark, MD 12/21/2017  7:50 AM   Subjective:  The patient is intubated, ventilated and sedated.  Family members are not around. Noted that the patient has been febrile overnight.  She is currently on broad-spectrum intravenous antibiotics.  Noted abnormal labs  Objective:  Vitals:   12/21/17 0500 12/21/17 0600  BP: (!) 132/56 (!) 124/57  Pulse: 67 64  Resp: (!) 34 (!) 34  Temp: 98.6 F (37 C) 98.4 F (36.9 C)  SpO2: 96% 95%     Intake/Output Summary (Last 24 hours) at  12/21/2017 0750 Last data filed at 12/21/2017 0615 Gross per 24 hour  Intake 3319.02 ml  Output 1170 ml  Net 2149.02 ml    GENERAL: Sedated and intubated.  Appears comfortable   Labs:  Lab Results  Component Value Date   WBC 18.6 (H) 12/21/2017   HGB 8.0 (L) 12/21/2017   HCT 26.3 (L) 12/21/2017   MCV 74.1 (L) 12/21/2017   PLT 469 (H) 12/21/2017   NEUTROABS 16.0 (H) 12/20/2017    Lab Results  Component Value Date   NA 142 12/21/2017   K 3.7 12/21/2017   CL 112 (H) 12/21/2017   CO2 19 (L) 12/21/2017    Studies:  Dg Abd 1 View  Result Date: 12/20/2017 CLINICAL DATA:  Nasogastric tube placement. EXAM: ABDOMEN - 1 VIEW COMPARISON:  Abdominal radiograph performed 12/18/2017 FINDINGS: The patient's enteric tube is noted ending overlying the body of the stomach. The visualized bowel gas pattern is grossly unremarkable, though difficult to fully assess due to motion artifact. No acute osseous abnormalities are seen. IMPRESSION: Enteric tube noted ending overlying the body of the stomach. Electronically Signed   By: Garald Balding M.D.   On: 12/20/2017 04:49   Dg Chest Port 1 View  Result Date: 12/20/2017 CLINICAL DATA:  Post intubation. EXAM: PORTABLE CHEST 1 VIEW COMPARISON:  Earlier today at L1 50 hours in FINDINGS: Endotracheal tube borderline low in position, 1.6 cm above carina. Patient rotated right. Right-sided PICC line terminates at low SVC. Nasogastric tube terminates in the distal esophagus, likely looped within. Cardiomegaly accentuated by AP portable technique. No pleural effusion or pneumothorax. Suspect mild pulmonary venous congestion, accentuated by low lung volumes. Left greater than right  base airspace disease. IMPRESSION: Endotracheal tube borderline low in position, 1.6 cm above carina. Consider retraction 2-3 cm. Nasogastric tube within the distal esophagus. Recommend advancement. Slightly worsened aeration with developing pulmonary venous congestion and right base  atelectasis. Left base airspace disease remains. These results will be called to the ordering clinician or representative by the Radiologist Assistant, and communication documented in the PACS or zVision Dashboard. Electronically Signed   By: Abigail Miyamoto M.D.   On: 12/20/2017 10:15   Dg Chest Port 1 View  Result Date: 12/20/2017 CLINICAL DATA:  Acute onset of shortness of breath. EXAM: PORTABLE CHEST 1 VIEW COMPARISON:  CT of the chest performed 12/15/2017 FINDINGS: The lungs are well-aerated. Left basilar airspace opacity is compatible with pneumonia. There is no evidence of pleural effusion or pneumothorax. The cardiomediastinal silhouette is borderline enlarged. No acute osseous abnormalities are seen. A right PICC is noted ending about the distal SVC. IMPRESSION: 1. Left basilar pneumonia noted. 2. Borderline cardiomegaly. Electronically Signed   By: Garald Balding M.D.   On: 12/20/2017 02:24

## 2017-12-21 NOTE — Progress Notes (Signed)
Pt CPT held this round due to increased agitation and respiratory rate.

## 2017-12-21 NOTE — Progress Notes (Signed)
CPT stopped due to patient RR increasing to 49 breaths/min. RT will continue to monitor patient.

## 2017-12-21 NOTE — Progress Notes (Signed)
ANTICOAGULATION CONSULT NOTE - [place] for heparin Indication: pulmonary embolus  Allergies  Allergen Reactions  . Codeine Nausea And Vomiting    Room spins   Patient Measurements: Height: 5\' 4"  (162.6 cm) Weight: 266 lb 15.6 oz (121.1 kg) IBW/kg (Calculated) : 54.7 Heparin Dosing Weight: 81kg  Vital Signs: Temp: 98.4 F (36.9 C) (04/17 0600) Temp Source: Core (Comment) (04/17 0400) BP: 124/57 (04/17 0600) Pulse Rate: 64 (04/17 0806)  Labs: Recent Labs    12/19/17 0557  12/20/17 0415 12/20/17 0416 12/20/17 0912 12/20/17 1300 12/21/17 0535 12/21/17 0536  HGB 8.5*  --  9.0*  --   --   --  8.0*  --   HCT 28.0*  --  28.2*  --   --   --  26.3*  --   PLT 509*  --  558*  --   --   --  469*  --   HEPARINUNFRC >2.20*   < >  --  0.10*  --  0.32  --  0.39  CREATININE 1.30*  --  1.47*  --  1.64*  --  1.96*  --    < > = values in this interval not displayed.   Estimated Creatinine Clearance: 39.2 mL/min (A) (by C-G formula based on SCr of 1.96 mg/dL (H)).  Medical History: Past Medical History:  Diagnosis Date  . Cancer [facility])    endometrial cancer  . Fibroids   . GERD (gastroesophageal reflux disease)   . Hypertension   . Sinusitis    Assessment: 49 YOF with stage IV uterine cancer.  She underwent ex lap with TAH, BSO, omentectomy and debulking on 3/21.  CT chest re-reviewed and determined to have RUL PE.   Pharmacy asked to dose heparin gtt.    Today, 12/21/2017   Hep level 0.39 on Heparin 1800 units/hr  CBC: Hgb low but stable. Plts stable  Renal: SCr 1.96 increasing, per notes baseline SCr 1.4  No line or bleeding issues per RN  Goal of Therapy:  Heparin level 0.3-0.7 units/ml Monitor platelets by anticoagulation protocol: Yes   Plan:   Continue Heparin infusion at 1800 units/hr  Monitor for signs and symptoms of bleeding  Daily heparin level and CBC  Minda Ditto PharmD Pager [phone] 12/21/2017, 9:27 AM

## 2017-12-21 NOTE — Progress Notes (Signed)
                                                                                                                                                                                                         Daily Progress Note   Patient Name: Robyn Guerra       Date: 12/21/2017 DOB: 06/14/1956  Age: 62 y.o. MRN#: [id number] Attending Physician: Agarwala, Ravi, MD Primary Care Physician: Reese, Betti, MD Admit Date: 12/16/2017  Reason for Consultation/Follow-up: Establishing goals of care  Subjective: Patient is intubated, mechanically ventilated.  Patient is currently on both precedex and fentanyl drip  Sister at bedside, see below  Length of Stay: 9  Current Medications: Scheduled Meds:  . Chlorhexidine Gluconate Cloth  6 each Topical Daily  . insulin aspart  0-9 Units Subcutaneous Q6H  . sodium chloride flush  10-40 mL Intracatheter Q12H    Continuous Infusions: . amiodarone Stopped (12/20/17 1327)  . ampicillin-sulbactam (UNASYN) IV 3 g (12/21/17 1330)  . dexmedetomidine (PRECEDEX) IV infusion 0.4 mcg/kg/hr (12/21/17 0743)  . dextrose 5 % and 0.9% NaCl 30 mL/hr at 12/21/17 0742  . famotidine (PEPCID) IV Stopped (12/21/17 1026)  . .TPN (CLINIMIX-E) Adult     And  . Fat emulsion    . fentaNYL infusion INTRAVENOUS 100 mcg/hr (12/21/17 1324)  . heparin 1,800 Units/hr (12/21/17 0956)  . .TPN (CLINIMIX-E) Adult 40 mL/hr at 12/21/17 0600    PRN Meds: albuterol, calcium carbonate, fentaNYL, guaiFENesin-dextromethorphan, hydrALAZINE, lidocaine, LORazepam, midazolam, [DISCONTINUED] ondansetron **OR** ondansetron (ZOFRAN) IV, phenol, sodium chloride flush  Physical Exam         Opens eyes and follows sister's commands She is on the vent. Diminished breath sounds bases Has moderate edema S 1 S2 Abdomen distended  Vital Signs: BP (!) 117/51   Pulse (!) 59   Temp (!) 97.3 F (36.3 C)   Resp (!) 27   Ht 5' 4" (1.626 m)   Wt 121.1 kg (266 lb 15.6 oz)   LMP 04/20/2016   SpO2  97%   BMI 45.83 kg/m  SpO2: SpO2: 97 % O2 Device: O2 Device: Ventilator O2 Flow Rate: O2 Flow Rate (L/min): 10 L/min  Intake/output summary:   Intake/Output Summary (Last 24 hours) at 12/21/2017 1332 Last data filed at 12/21/2017 1006 Gross per 24 hour  Intake 3259.07 ml  Output 1105 ml  Net 2154.07 ml   LBM: Last BM Date: 12/17/17 Baseline Weight: Weight: 110.2 kg (242 lb 15.2 oz) Most recent weight: Weight: 121.1 kg (266 lb 15.6 oz) PPS 10%        Palliative Assessment/Data:    Flowsheet Rows     Most Recent Value  Intake Tab  Referral Department  Hospitalist  Unit at Time of Referral  ICU  Palliative Care Primary Diagnosis  Cancer  Date Notified  12/15/17  Palliative Care Type  New Palliative care  Reason for referral  Clarify Goals of Care, Pain  Date of Admission  12/05/2017  Date first seen by Palliative Care  12/16/17  # of days Palliative referral response time  1 Day(s)  # of days IP prior to Palliative referral  6  Clinical Assessment  Palliative Performance Scale Score  50%  Pain Max last 24 hours  8  Pain Min Last 24 hours  3  Psychosocial & Spiritual Assessment  Palliative Care Outcomes  Patient/Family meeting held?  Yes  Palliative Care Outcomes  Clarified goals of care      Patient Active Problem List   Diagnosis Date Noted  . Malnutrition of moderate degree 12/18/2017  . Renal failure 12/12/2017  . Iron deficiency anemia due to chronic blood loss 12/16/2017  . Wound dehiscence, surgical, initial encounter 12/12/2017  . Acute prerenal failure (HCC) 12/16/2017  . Abdominal pain 12/06/2017  . Other constipation 12/12/2017  . Family history of cancer 12/08/2017  . Renal failure, acute (HCC) 01/02/2018  . Secondary malignant neoplasm of omentum (HCC)   . Peritoneal carcinomatosis (HCC)   . Endometrial cancer (HCC) 11/01/2017  . Morbid obesity with BMI of 45.0-49.9, adult (HCC) 11/01/2017    Palliative Care Assessment & Plan   Patient Profile:     Assessment:  recurrent uterine cancer Recent malignant ascites Liver mets A fib with RVR PE VDRF Asp pna Moderate protein calorie malnutrition  Peritoneal carcinomatosis.   Discussions/Recommendations/Plan:   I met with the patient's sister by the bedside today, we reviewed the patient's life history and her recently diagnosed cancer. Sister is HCPOA agent, she is also a nursing instructor for [employer], while sister states she understands that the patient is seriously ill and has advanced malignancy, for now, she wishes to continue full code/full scope: "that's my only sister and I have to hang in there for her, you know".  PMT to continue to follow patient's disease trajectory and hospital course and provide supportive care and assist with decision making.     Code Status:    Code Status Orders  (From admission, onward)        Start     Ordered   12/23/2017 1638  Full code  Continuous     12/17/2017 1647    Code Status History    Date Active Date Inactive Code Status Order ID Comments User Context   11/24/2017 1904 11/27/2017 2046 Full Code [id number]  Phelps, Shawna B, MD Inpatient       Prognosis:   Unable to determine  Discharge Planning:  To Be Determined  Care plan was discussed with  Patient's sister.   Thank you for allowing the Palliative Medicine Team to assist in the care of this patient.   Time In:  1300 Time Out: 1335 Total Time 35 Prolonged Time Billed  no       Greater than 50%  of this time was spent counseling and coordinating care related to the above assessment and plan.  Zeba Anwar, MD [phone]  Please contact Palliative Medicine Team phone at [phone] for questions and concerns.      

## 2017-12-21 NOTE — Progress Notes (Signed)
eLink Physician-Brief Progress Note Patient Name: Robyn Guerra DOB: 1955-11-29 MRN: [id number]   Date of Service  12/21/2017  HPI/Events of Note  Agitation on Precedex not responsive to increase in dose. Pts QT is also prolonged.  eICU Interventions  DC precedex. Substitute Propofol for better sedation on the ventilator. Check electrolytes and review meds for QT prolonging medications.        Kerry Kass Ogan 12/21/2017, 8:53 PM

## 2017-12-21 NOTE — Progress Notes (Signed)
CPT held due to RR 39 and accessory muscle use. Charge RN made aware.

## 2017-12-21 NOTE — Progress Notes (Addendum)
PHARMACY - ADULT TOTAL PARENTERAL NUTRITION CONSULT NOTE   Pharmacy Consult for TPN Indication: SBO  Patient Measurements: Height: 5\' 4"  (244.0 cm) Weight: 266 lb 15.6 oz (121.1 kg) IBW/kg (Calculated) : 54.7 TPN AdjBW (KG): 68.6 Body mass index is 45.83 kg/m. Usual Weight: 30 lb weight loss/Dietitian notes 4/8  Insulin Requirements: 3 units, on Sensitive Novolog scale q6hr No hx DM   Current Nutrition: NPO, sips/chips  IVF: D5W-NS at 30 ml/hr  Central access: Picc 4/11 TPN start date: 4/15  ASSESSMENT                                                                                        Recurrent Uterine Ca, liver mets, ascites, small bowel obstruction.  Significant events:  Today:   Glucose - serum glucose 148  Electrolytes - K 3.7, Mag 2.2, Phos 3.7, Corr Ca 9.2  Renal - SCr increasing,  to 1.96 today  LFTs - wnl   TGs - 68  Prealbumin - < 5  NG output 400 ml/24 hr, UOP 550 ml/24 hr  NUTRITIONAL GOALS                                                                                             RD recs: from 4/16 note >/= 136 g/day protein, 1027-2536 Kcal/day.  Clinimix / at a goal rate of ml/hr + 20% fat emulsion at  ml/hr to provide: await updated note to calculate goals  PLAN                                                                                                                         At 1800 today:  Continue Clinimix E 5/15 at 40 ml/hr  20% fat emulsion at 20 ml/hr over 12 hr  Plan to advance as tolerated to the goal rate.  TPN to contain standard multivitamins and trace elements.  Continue IVF at 30 ml/hr.  Continue sensitive scale Novolog, CBGs q6hr  TPN lab panels on Mondays & Thursdays.  F/u daily.  Minda Ditto PharmD Pager [phone] 12/21/2017, 12:05 PM

## 2017-12-21 NOTE — Consult Note (Signed)
PULMONARY / CRITICAL CARE MEDICINE   Name: Robyn Guerra MRN: [id number] DOB: 10/26/1955    ADMISSION DATE:  12/11/2017 CONSULTATION DATE:  12/20/2017  REFERRING MD:  Maylene Roes - [facility].   CHIEF COMPLAINT:  Dyspnea.  HISTORY OF PRESENT ILLNESS:   62 year old woman with stage IV uterine cancer s/p ex lap, TAH, BSO, omentectomy, radical tumor debulking on 11/24/17.  Went home with sister and wasreadmitted with postop AKI, and pain. Underwent paracentesis of malignant ascites with improvement in symptoms.    While planning for palliative chemotherapy, the patient developed AF related to PE, SBO and aspiration pneumonia. While initially improved, she coughed out NGT 4/15 and vomited and aspirated. By the morning of 4/16 we were consulted for increasing dyspnea.   The patient has been informed of her poor long-term prognosis, but is still with her sister requesting intubation. To the extent that some of her current condition may be related to her immediate post-operative state, a trial of short term intubation is reasonable.   PAST MEDICAL HISTORY :  She  has a past medical history of Cancer ([place]), Fibroids, GERD (gastroesophageal reflux disease), Hypertension, and Sinusitis.  PAST SURGICAL HISTORY: She  has a past surgical history that includes Myomectomy; laparotomy (N/A, 11/24/2017); Abdominal hysterectomy (N/A, 11/24/2017); Salpingoophorectomy (Bilateral, 11/24/2017); Lymph node dissection (N/A, 11/24/2017); and Debulking (N/A, 11/24/2017).  Allergies  Allergen Reactions  . Codeine Nausea And Vomiting    Room spins    No current facility-administered medications on file prior to encounter.    Current Outpatient Medications on File Prior to Encounter  Medication Sig  . acetaminophen (TYLENOL) 500 MG tablet Take 500 mg by mouth every 6 (six) hours as needed for moderate pain.  Marland Kitchen BIOTIN PO Take 1 tablet by mouth daily.  Marland Kitchen docusate sodium (COLACE) 100 MG capsule Take 1 capsule (100  mg total) by mouth 2 (two) times daily.  Marland Kitchen enoxaparin (LOVENOX) 40 MG/0.4ML injection Inject 0.4 mLs (40 mg total) into the skin daily for 25 days.  . hydrochlorothiazide (HYDRODIURIL) 25 MG tablet Take 1 tablet (25 mg total) by mouth daily.  Marland Kitchen ibuprofen (ADVIL,MOTRIN) 200 MG tablet Take 400 mg by mouth every 6 (six) hours as needed for moderate pain.  Marland Kitchen oxyCODONE-acetaminophen (PERCOCET/ROXICET) 5-325 MG tablet Take 1-2 tablets by mouth every 4 (four) hours as needed for severe pain.  . polyethylene glycol (MIRALAX / GLYCOLAX) packet Take 17 g by mouth 2 (two) times daily.  . Wheat Dextrin (BENEFIBER DRINK MIX PO) Take 30 mLs by mouth once.  . diphenhydramine-acetaminophen (TYLENOL PM) 25-500 MG TABS tablet Take 1 tablet by mouth at bedtime as needed (sleep).  Marland Kitchen ibuprofen (ADVIL,MOTRIN) 600 MG tablet Take 1 tablet (600 mg total) by mouth every 6 (six) hours as needed. (Patient not taking: Reported on 12/28/2017)    FAMILY HISTORY:  Her indicated that the status of her mother is unknown. She indicated that both of her brothers are alive. She indicated that the status of her paternal grandmother is unknown.   SOCIAL HISTORY: She  reports that she quit smoking about 39 years ago. Her smoking use included cigarettes. She quit after 4.00 years of use. She has never used smokeless tobacco. She reports that she drinks alcohol. She reports that she does not use drugs.  REVIEW OF SYSTEMS:   A full ROS was not obtained due to the patient's critical state  SUBJECTIVE:  Reports increased dyspnea and ongoing abdominal pain prior to intubation.  VITAL SIGNS: BP Marland Kitchen)  117/51   Pulse (!) 59   Temp (!) 97.3 F (36.3 C)   Resp (!) 27   Ht 5\' 4"  (1.626 m)   Wt 266 lb 15.6 oz (121.1 kg)   LMP 04/20/2016   SpO2 97%   BMI 45.83 kg/m   HEMODYNAMICS:  On no vasopressors.  VENTILATOR SETTINGS: Vent Mode: PRVC FiO2 (%):  [50 %-100 %] 50 % Set Rate:  [24 bmp] 24 bmp Vt Set:  [440 mL] 440 mL PEEP:  [5  cmH20] 5 cmH20 Plateau Pressure:  [18 cmH20-26 cmH20] 25 cmH20  INTAKE / OUTPUT: I/O last 3 completed shifts: In: 5379.2 [I.V.:4479.2; Other:150; IV Piggyback:750] Out: 0175 [Urine:1020; Emesis/NG output:450]  PHYSICAL EXAMINATION: General:  Chronically ill appearing woman in moderate respiratory distress. Neuro:  Awake able to answer simple questions. Moving all limbs. HEENT:  Very poor dentition with 2 very loose incisors. Cardiovascular: Normotensive. Extremities are warm and well perfused. HS are normal. Bradycardic in sinus rhythm. Lungs:  Decreased air-entry to the bases.  No rhonchi, but still suctioning copious secretions Abdomen:  Less distended and no longer tender. BS are present. NG aspirate has decreased. Musculoskeletal:  Moderate loss of muscle mass. Skin:  Intact, moderate edema.  LABS:  BMET Recent Labs  Lab 12/20/17 0415 12/20/17 0912 12/21/17 0535  NA 134* 142 142  K 5.0 4.1 3.7  CL 107 113* 112*  CO2 15* 21* 19*  BUN 11 15 19   CREATININE 1.47* 1.64* 1.96*  GLUCOSE 648* 153* 148*    Electrolytes Recent Labs  Lab 12/20/17 0415 12/20/17 0912 12/21/17 0535  CALCIUM 8.2* 8.1* 8.2*  MG 2.4 2.2 2.2  PHOS 5.9* 5.0* 3.7    CBC Recent Labs  Lab 12/19/17 0557 12/20/17 0415 12/21/17 0535  WBC 11.0* 18.6* 18.6*  HGB 8.5* 9.0* 8.0*  HCT 28.0* 28.2* 26.3*  PLT 509* 558* 469*    Coag's No results for input(s): APTT, INR in the last 168 hours.  Sepsis Markers Recent Labs  Lab 12/15/17 1546  LATICACIDVEN 1.2    ABG Recent Labs  Lab 12/20/17 1135 12/20/17 1227 12/20/17 1645  PHART 7.264* 7.216* 7.287*  PCO2ART 45.4 53.5* 42.3  PO2ART 80.0* 87.8 126*    Liver Enzymes Recent Labs  Lab 12/20/17 0415 12/20/17 0912  AST 17 14*  ALT 14 14  ALKPHOS 72 81  BILITOT 0.4 0.5  ALBUMIN 2.0* 2.0*    Cardiac Enzymes Recent Labs  Lab 12/15/17 1909 12/16/17 0019 12/16/17 0619  TROPONINI 0.21* 0.11* 0.06*    Glucose Recent Labs  Lab  12/20/17 1034 12/20/17 1141 12/20/17 1716 12/21/17 0016 12/21/17 0534 12/21/17 1138  GLUCAP 153* 172* 116* 129* 167* 114*    Imaging Dg Abd 1 View  Result Date: 12/21/2017 CLINICAL DATA:  Orogastric tube placement. EXAM: ABDOMEN - 1 VIEW COMPARISON:  Radiograph of December 20, 2017. FINDINGS: The bowel gas pattern is normal. Orogastric tube is seen to loop in the proximal stomach, with distal tip heading retrograde into distal esophagus. No radio-opaque calculi or other significant radiographic abnormality are seen. IMPRESSION: Orogastric tube is looped within proximal stomach, with distal tip in distal esophagus. There is no evidence of bowel obstruction or ileus. Electronically Signed   By: Marijo Conception, M.D.   On: 12/21/2017 10:34     STUDIES:  CXR shows right mainstem intubation - tube withdrawn. Bilateral interstitial infiltrates consistent with aspiration.   CULTURES: Negative blood cultures on 4/11  ANTIBIOTICS: On Unisyn for aspiration pneumonia.  SIGNIFICANT EVENTS:  Intubated for evolving respiratory failure.  LINES/TUBES: Double lumen PICC, Foley, NG. 7.5 ETT  DISCUSSION: Unfortunate woman with advanced cancer and failure to recover from initial debulking surgery. While long-term prognosis is poor the hope would be to get her over the initial complications of the surgery and provide her with some quality of life. The patient and her sister would like aggressive care to continue, so we have proceeded with intubation.  The outcome is not guaranteed and Palliative Care's ongoing involvement to continue an ongoing discussion of goals of care is appreciated.  ASSESSMENT / PLAN:  PULMONARY A: Acute respiratory failure due to aspiration pneumonia requiring pre-emptive intubation. P:   Lung protective ventilation strategy and VAP prevention. Delay extubation until:  SBO under control as will remain at high risk for aspiration and re-intubation until then and secretions  diminished. Have advised the patient's sister [name]) that 7 days would be a reasonable time frame to expect improvement and extubation and that ventilator dependence beyond that time would portend a very poor prognosis. We agreed that goals of care would be readdressed at that point. Likewise, she agreed to no ACLS in case of cardiac arrest and that we might also readdress goals of care for significant escalations in care such pressors, dialysis etc.  CARDIOVASCULAR A:  Atrial fibrillation has resolved with mechanical ventilation and sedation. P:  Discontinue amiodarone.  RENAL A:   Stage III CKD.  Clinically volume overloaded since admission with edema, particularly of lower extremities.  Daily + fluid balance.  Mild  P:   Will hold on diuresis until creatinine shows improvement.  GASTROINTESTINAL A:   SBO, conservatively managed with NG suctioning.  High nutritional risk - managed with TPN.   P:   Continue NGT suctioning. Correct electrolyte abnormalities. If minimal drainage again tomorrow, will begin clamping trials. Monitor LFT's while on TPN.  HEMATOLOGIC A:   Anemia of chronic disease. Reactive thrombocytosis. Malignancy related and post operative PE.  P:  No indications for transfusion. On IV Heparin for management of PE.   INFECTIOUS A:   Leukocytosis secondary to aspiration pneumonia.  CT shows bibasilar consolidation. P:   Continue current antibiotics.  ENDOCRINE A:   Remains euglycemic despite TPN P:   Consider addition of insulin into TPN if becomes more hyperglycemic.  NEUROLOGIC A:   No delirium pre-intubation. Comfortable on current sedation regimen P:   RASS goal: -1 to 0 Fentanyl for pain control with Precedex and lorazepam for anxiety.   FAMILY  - Updates: sister updated today at bedside and participated in decision to intubate.  - Inter-disciplinary family meet or Palliative Care meeting due by:  day 7.  CODE STATUS: full code. Goals of care  to be reassessed on ongoing basis.  This patient is critically ill due to respiratory failure requiring mechanical ventilation and titration of sedative infusions. Total critical care time excluding separately billable procedures: 40 min.  Kipp Brood, MD Pulmonary and [place] Pager: [phone] Pager: [phone] (after hours)  12/21/2017, 1:33 PM

## 2017-12-22 ENCOUNTER — Inpatient Hospital Stay (HOSPITAL_COMMUNITY): Payer: BLUE CROSS/BLUE SHIELD

## 2017-12-22 LAB — COMPREHENSIVE METABOLIC PANEL
ALK PHOS: 85 U/L (ref 38–126)
ALT: 13 U/L — ABNORMAL LOW (ref 14–54)
AST: 16 U/L (ref 15–41)
Albumin: 1.7 g/dL — ABNORMAL LOW (ref 3.5–5.0)
Anion gap: 8 (ref 5–15)
BUN: 21 mg/dL — ABNORMAL HIGH (ref 6–20)
CALCIUM: 8.2 mg/dL — AB (ref 8.9–10.3)
CO2: 21 mmol/L — ABNORMAL LOW (ref 22–32)
CREATININE: 1.87 mg/dL — AB (ref 0.44–1.00)
Chloride: 113 mmol/L — ABNORMAL HIGH (ref 101–111)
GFR calc Af Amer: 33 mL/min — ABNORMAL LOW (ref >60)
GFR, EST NON AFRICAN AMERICAN: 28 mL/min — AB (ref >60)
Glucose, Bld: 120 mg/dL — ABNORMAL HIGH (ref 65–99)
Potassium: 3.6 mmol/L (ref 3.5–5.1)
Sodium: 142 mmol/L (ref 135–145)
Total Bilirubin: 0.4 mg/dL (ref 0.3–1.2)
Total Protein: 6.5 g/dL (ref 6.5–8.1)

## 2017-12-22 LAB — HEPARIN LEVEL (UNFRACTIONATED)
Heparin Unfractionated: 0.27 IU/mL — ABNORMAL LOW (ref 0.30–0.70)
Heparin Unfractionated: 0.3 IU/mL (ref 0.30–0.70)
Heparin Unfractionated: 0.37 IU/mL (ref 0.30–0.70)

## 2017-12-22 LAB — CBC
HCT: 25.1 % — ABNORMAL LOW (ref 36.0–46.0)
HEMOGLOBIN: 7.6 g/dL — AB (ref 12.0–15.0)
MCH: 22.6 pg — AB (ref 26.0–34.0)
MCHC: 30.3 g/dL (ref 30.0–36.0)
MCV: 74.5 fL — AB (ref 78.0–100.0)
Platelets: 438 10*3/uL — ABNORMAL HIGH (ref 150–400)
RBC: 3.37 MIL/uL — ABNORMAL LOW (ref 3.87–5.11)
RDW: 19.6 % — ABNORMAL HIGH (ref 11.5–15.5)
WBC: 16.5 10*3/uL — AB (ref 4.0–10.5)

## 2017-12-22 LAB — MAGNESIUM: MAGNESIUM: 2.3 mg/dL (ref 1.7–2.4)

## 2017-12-22 LAB — GLUCOSE, CAPILLARY
Glucose-Capillary: 112 mg/dL — ABNORMAL HIGH (ref 65–99)
Glucose-Capillary: 98 mg/dL (ref 65–99)
Glucose-Capillary: 112 mg/dL — ABNORMAL HIGH (ref 65–99)
Glucose-Capillary: 107 mg/dL — ABNORMAL HIGH (ref 65–99)

## 2017-12-22 LAB — PHOSPHORUS: PHOSPHORUS: 4.2 mg/dL (ref 2.5–4.6)

## 2017-12-22 MED ORDER — CHLORHEXIDINE GLUCONATE 0.12% ORAL RINSE (MEDLINE KIT)
15.0000 mL | Freq: Two times a day (BID) | OROMUCOSAL | Status: DC
  Administered 2017-12-22 – 2017-12-29 (×15): 15 mL via OROMUCOSAL

## 2017-12-22 MED ORDER — FUROSEMIDE 10 MG/ML IJ SOLN
60.0000 mg | Freq: Once | INTRAMUSCULAR | Status: AC
  Administered 2017-12-22: 60 mg via INTRAVENOUS
  Filled 2017-12-22: qty 6

## 2017-12-22 MED ORDER — ORAL CARE MOUTH RINSE
15.0000 mL | OROMUCOSAL | Status: DC
  Administered 2017-12-22 – 2017-12-29 (×68): 15 mL via OROMUCOSAL

## 2017-12-22 MED ORDER — HEPARIN (PORCINE) IN NACL 100-0.45 UNIT/ML-% IJ SOLN
1950.0000 [IU]/h | INTRAMUSCULAR | Status: DC
  Administered 2017-12-22 – 2017-12-23 (×2): 1950 [IU]/h via INTRAVENOUS
  Filled 2017-12-22 (×4): qty 250

## 2017-12-22 MED ORDER — VITAL HIGH PROTEIN PO LIQD
1000.0000 mL | ORAL | Status: DC
  Administered 2017-12-22 – 2017-12-24 (×3): 1000 mL
  Filled 2017-12-22 (×3): qty 1000

## 2017-12-22 MED ORDER — HEPARIN (PORCINE) IN NACL 100-0.45 UNIT/ML-% IJ SOLN
1850.0000 [IU]/h | INTRAMUSCULAR | Status: DC
  Administered 2017-12-22: 1850 [IU]/h via INTRAVENOUS

## 2017-12-22 MED ORDER — TRACE MINERALS CR-CU-MN-SE-ZN 10-1000-500-60 MCG/ML IV SOLN
INTRAVENOUS | Status: AC
  Administered 2017-12-22: 18:00:00 via INTRAVENOUS
  Filled 2017-12-22 (×2): qty 1440

## 2017-12-22 NOTE — Progress Notes (Signed)
Patient has labored breathing at this time; CPT held. RT will attempt at next scheduled time as tolerated by patient.

## 2017-12-22 NOTE — Progress Notes (Signed)
ANTICOAGULATION CONSULT NOTE - [place] for heparin Indication: pulmonary embolus  Allergies  Allergen Reactions  . Codeine Nausea And Vomiting    Room spins   Patient Measurements: Height: 5\' 4"  (162.6 cm) Weight: 266 lb 15.6 oz (121.1 kg) IBW/kg (Calculated) : 54.7 Heparin Dosing Weight: 81kg  Vital Signs: Temp: 98.1 F (36.7 C) (04/18 0500) BP: 130/65 (04/18 0500) Pulse Rate: 70 (04/18 0500)  Labs: Recent Labs    12/20/17 0415  12/20/17 0912 12/20/17 1300 12/21/17 0535 12/21/17 0536 12/22/17 0450  HGB 9.0*  --   --   --  8.0*  --  7.6*  HCT 28.2*  --   --   --  26.3*  --  25.1*  PLT 558*  --   --   --  469*  --  438*  HEPARINUNFRC  --    < >  --  0.32  --  0.39 0.27*  CREATININE 1.47*  --  1.64*  --  1.96*  --  1.87*   < > = values in this interval not displayed.   Estimated Creatinine Clearance: 41.1 mL/min (A) (by C-G formula based on SCr of 1.87 mg/dL (H)).  Medical History: Past Medical History:  Diagnosis Date  . Cancer [facility])    endometrial cancer  . Fibroids   . GERD (gastroesophageal reflux disease)   . Hypertension   . Sinusitis    Assessment: 58 YOF with stage IV uterine cancer.  She underwent ex lap with TAH, BSO, omentectomy and debulking on 3/21.  CT chest re-reviewed and determined to have RUL PE.   Pharmacy asked to dose heparin gtt.    4/17  Hep level 0.39 on Heparin 1800 units/hr  CBC: Hgb low but stable. Plts stable  Renal: SCr 1.96 increasing, per notes baseline SCr 1.4  No line or bleeding issues per RN Today, 4/18  0450 HL=0.27 slightly below goal, no bleeding issues, HL drawn from central line and drip running in PIV. RN did stop heparin for ~ 10 mins to drawn lab.  Goal of Therapy:  Heparin level 0.3-0.7 units/ml Monitor platelets by anticoagulation protocol: Yes   Plan:   Increase heparin drip to 1850 units/hr  Monitor for signs and symptoms of bleeding  Daily heparin level and  CBC   Dorrene German 12/22/2017, 6:12 AM

## 2017-12-22 NOTE — Progress Notes (Signed)
ANTICOAGULATION CONSULT NOTE - Follow Up Consult  Pharmacy Consult for Heparin Indication: atrial fibrillation and pulmonary embolus  Allergies  Allergen Reactions  . Codeine Nausea And Vomiting    Room spins    Patient Measurements: Height: 5\' 4"  (162.6 cm) Weight: 282 lb 6.6 oz (128.1 kg) IBW/kg (Calculated) : 54.7 Heparin Dosing Weight: 86.3 kg   Vital Signs: Temp: 99.9 F (37.7 C) (04/18 2100) BP: 107/56 (04/18 2100) Pulse Rate: 87 (04/18 2100)  Labs: Recent Labs    12/20/17 0415  12/20/17 0912  12/21/17 0535  12/22/17 0450 12/22/17 1343 12/22/17 2123  HGB 9.0*  --   --   --  8.0*  --  7.6*  --   --   HCT 28.2*  --   --   --  26.3*  --  25.1*  --   --   PLT 558*  --   --   --  469*  --  438*  --   --   HEPARINUNFRC  --    < >  --    < >  --    < > 0.27* 0.30 0.37  CREATININE 1.47*  --  1.64*  --  1.96*  --  1.87*  --   --    < > = values in this interval not displayed.    Estimated Creatinine Clearance: 42.5 mL/min (A) (by C-G formula based on SCr of 1.87 mg/dL (H)).   Medications:  Infusions:  . Marland KitchenTPN (CLINIMIX-E) Adult 60 mL/hr at 12/22/17 1730  . famotidine (PEPCID) IV 20 mg (12/22/17 2130)  . fentaNYL infusion INTRAVENOUS 250 mcg/hr (12/22/17 1237)  . heparin 1,950 Units/hr (12/22/17 1550)  . propofol (DIPRIVAN) infusion 30 mcg/kg/min (12/22/17 1636)  .  sodium bicarbonate  infusion 1000 mL 50 mL/hr at 12/22/17 1217    Assessment: 66 YOF with stage IV uterine cancer.  She underwent ex lap with TAH, BSO, omentectomy and debulking on 3/21.  CT chest re-reviewed and determined to have RUL PE.   Pharmacy is consulted to dose heparin gtt.    Today, 12/22/2017: PM update: heparin level therapeutic following small rate increase this afternoon CBC:  Hgb decreased to 7.6, Plt remain elevated at 438 No bleeding or infusion complications reported Renal: SCr 1.87 is elevated, but slightly improved from yesterday   Goal of Therapy:  Heparin level 0.3-0.7  units/ml Monitor platelets by anticoagulation protocol: Yes   Plan:  Continue heparin IV infusion at 1950 units/hr Daily heparin level and CBC Continue to monitor H&H and platelets  Doreene Eland, PharmD, BCPS.   Pager: [phone] 12/22/2017 9:54 PM

## 2017-12-22 NOTE — Progress Notes (Signed)
Physical Therapy Discharge Patient Details Name: Robyn Guerra MRN: [id number] DOB: 1956/06/14 Today's Date: 12/22/2017 Time:  -     Patient discharged from PT services secondary to medical decline - will need to re-order PT to resume therapy services.remains on ventilator   GP     Marcelino Freestone PT [phone]  12/22/2017, 7:08 AM

## 2017-12-22 NOTE — Progress Notes (Signed)
PHARMACY - ADULT TOTAL PARENTERAL NUTRITION CONSULT NOTE   Pharmacy Consult for TPN Indication: SBO  Patient Measurements: Height: 5\' 4"  (162.6 cm) Weight: 266 lb 15.6 oz (121.1 kg) IBW/kg (Calculated) : 54.7 TPN AdjBW (KG): 68.6 Body mass index is 45.83 kg/m. Usual Weight: 30 lb weight loss per Dietitian notes 4/8  Insulin Requirements: 3 units, on Sensitive Novolog scale q6hr No hx DM   Current Nutrition: NPO Propofol infusion started 4/17 PM - currently @ 18.2 ml/hr  IVF: D5-NaBicarb 150 mEq/L @ 50 ml/hr  Central access: PICC 4/11 TPN start date: 4/15  ASSESSMENT                                                                                       Recurrent Uterine Ca, liver mets, ascites, small bowel obstruction.  Significant events:  Today:   Glucose - serum glucose 148  Electrolytes - Na, K, CorrCa 10, Phos, Mag WNL.  Cl elevated at 113  Renal - SCr remains elevated at 1.87   LFTs - wnl   TGs - 68  Prealbumin  < 5 (4/16)  NG output 125 ml/24 hr, UOP 755 ml/24 hr   Volume overload:  I/O net +2.6L yesterday.  Weight up to 128 kg (baseline weight 110 kg on 4/5).  Poor urine output, unable to diurese with CKD-III, and AKI.  Total net I/O + 27 L since admission on 4/5.  Per MD, OK to continue to titrate TPN on 4/18.   NUTRITIONAL GOALS                                                                                             RD recs (4/16): Kcal:  4163-8453; Protein:  >/= 136 g  Based on published guidelines, while the patient meets ICU status the initiation of lipids is being delayed for 7 days or until transition out of ICU.  Clinimix 5/15 @ 110 mL/hr to provide 132 grams protein (97% of goal) and 1874 kcal (121 % of goal)  Glucose infusion rate will be ~2.1 mg/kg/min (Maximum 5 mg/kg/min)   PLAN  At 1800 today:  Increase to Clinimix E  5/15 at 60 ml/hr  HOLD lipids  Plan to advance as tolerated to the goal rate. Monitor fluid status closely.  TPN to contain standard multivitamins and trace elements.  Continue IVF per MD (total fluid rate 110 ml/hr)  Continue sensitive scale Novolog, CBGs q6hr  TPN lab panels on Mondays & Thursdays.  F/u daily.  Gretta Arab PharmD, BCPS Pager [phone] 12/22/2017, 7:32 AM

## 2017-12-22 NOTE — Progress Notes (Signed)
ANTICOAGULATION CONSULT NOTE - Follow Up Consult  Pharmacy Consult for Heparin Indication: atrial fibrillation and pulmonary embolus  Allergies  Allergen Reactions  . Codeine Nausea And Vomiting    Room spins    Patient Measurements: Height: 5\' 4"  (162.6 cm) Weight: 282 lb 6.6 oz (128.1 kg) IBW/kg (Calculated) : 54.7 Heparin Dosing Weight: 86.3 kg   Vital Signs: Temp: 99.9 F (37.7 C) (04/18 1130) BP: 115/46 (04/18 1130) Pulse Rate: 83 (04/18 1208)  Labs: Recent Labs    12/20/17 0415  12/20/17 0912 12/20/17 1300 12/21/17 0535 12/21/17 0536 12/22/17 0450  HGB 9.0*  --   --   --  8.0*  --  7.6*  HCT 28.2*  --   --   --  26.3*  --  25.1*  PLT 558*  --   --   --  469*  --  438*  HEPARINUNFRC  --    < >  --  0.32  --  0.39 0.27*  CREATININE 1.47*  --  1.64*  --  1.96*  --  1.87*   < > = values in this interval not displayed.    Estimated Creatinine Clearance: 42.5 mL/min (A) (by C-G formula based on SCr of 1.87 mg/dL (H)).   Medications:  Infusions:  . Marland KitchenTPN (CLINIMIX-E) Adult    . famotidine (PEPCID) IV Stopped (12/22/17 1025)  . fentaNYL infusion INTRAVENOUS 250 mcg/hr (12/22/17 1237)  . heparin 1,850 Units/hr (12/22/17 5170)  . propofol (DIPRIVAN) infusion 25 mcg/kg/min (12/22/17 0510)  .  sodium bicarbonate  infusion 1000 mL 50 mL/hr at 12/22/17 1217  . Marland KitchenTPN (CLINIMIX-E) Adult 40 mL/hr at 12/21/17 1711    Assessment: 84 YOF with stage IV uterine cancer.  She underwent ex lap with TAH, BSO, omentectomy and debulking on 3/21.  CT chest re-reviewed and determined to have RUL PE.   Pharmacy is consulted to dose heparin gtt.    Today, 12/22/2017: Heparin level 0.3 is therapeutic at the low end of the goal range CBC:  Hgb decreased to 7.6, Plt remain elevated at 438 No bleeding or infusion complications reported Renal: SCr 1.87 is elevated, but slightly improved from yesterday   Goal of Therapy:  Heparin level 0.3-0.7 units/ml Monitor platelets by  anticoagulation protocol: Yes   Plan:  Increase to heparin IV infusion at 1950 units/hr Heparin level 6 hours after rate change. Daily heparin level and CBC Continue to monitor H&H and platelets   Gretta Arab PharmD, BCPS Pager [phone] 12/22/2017,12:22 PM

## 2017-12-22 NOTE — Progress Notes (Signed)
PULMONARY / CRITICAL CARE MEDICINE   Name: Robyn Guerra MRN: [id number] DOB: 1956-08-22    ADMISSION DATE:  12/15/2017 CONSULTATION DATE:  12/20/2017  REFERRING MD:  Maylene Roes - [facility].   CHIEF COMPLAINT:  Dyspnea.  BRIEF SUMMARY:   62 year old woman with stage IV uterine cancer s/p ex lap, TAH, BSO, omentectomy, radical tumor debulking on 11/24/17.  Went home with sister and was readmitted with postop AKI, and pain. Underwent paracentesis of malignant ascites with improvement in symptoms.    While planning for palliative chemotherapy, the patient developed AF related to PE, SBO and aspiration pneumonia. While initially improved, she coughed out NGT 4/15 and vomited and aspirated. By the morning of 4/16, PCCM consulted for increasing dyspnea.   The patient has been informed of her poor long-term prognosis, but is still with her sister requesting intubation. To the extent that some of her current condition may be related to her immediate post-operative state, a trial of short term intubation is reasonable.   SUBJECTIVE:  RN reports pt has swelling.  Remains on bicarb gtt, full vent support.  NGT coiled in esophagus. WUA > pt followed commands.    VITAL SIGNS: BP (!) 115/46   Pulse 83   Temp 99.9 F (37.7 C)   Resp (!) 27   Ht 5\' 4"  (1.626 m)   Wt 282 lb 6.6 oz (128.1 kg)   LMP 04/20/2016   SpO2 99%   BMI 48.48 kg/m   HEMODYNAMICS:    VENTILATOR SETTINGS: Vent Mode: PRVC FiO2 (%):  [50 %] 50 % Set Rate:  [24 bmp] 24 bmp Vt Set:  [440 mL] 440 mL PEEP:  [5 cmH20] 5 cmH20 Plateau Pressure:  [20 WUJ81-19 cmH20] 20 cmH20  INTAKE / OUTPUT: I/O last 3 completed shifts: In: 5445.6 [I.V.:4685.6; Other:10; IV Piggyback:750] Out: 1478 [GNFAO:1308; Emesis/NG output:575]  PHYSICAL EXAMINATION: General: critically ill appearing female in NAD on vent  HEENT: MM pink/moist, ETT Neuro: Sedate CV: s1s2 rrr, no m/r/g PULM: even/non-labored, lungs bilaterally diminished   MV:HQIO, non-tender, bsx4 active  Extremities: warm/dry, 1-2+ generalized edema  Skin: no rashes or lesions  LABS:  BMET Recent Labs  Lab 12/20/17 0912 12/21/17 0535 12/22/17 0450  NA 142 142 142  K 4.1 3.7 3.6  CL 113* 112* 113*  CO2 21* 19* 21*  BUN 15 19 21*  CREATININE 1.64* 1.96* 1.87*  GLUCOSE 153* 148* 120*    Electrolytes Recent Labs  Lab 12/20/17 0912 12/21/17 0535 12/22/17 0450  CALCIUM 8.1* 8.2* 8.2*  MG 2.2 2.2 2.3  PHOS 5.0* 3.7 4.2    CBC Recent Labs  Lab 12/20/17 0415 12/21/17 0535 12/22/17 0450  WBC 18.6* 18.6* 16.5*  HGB 9.0* 8.0* 7.6*  HCT 28.2* 26.3* 25.1*  PLT 558* 469* 438*    Coag's No results for input(s): APTT, INR in the last 168 hours.  Sepsis Markers Recent Labs  Lab 12/15/17 1546  LATICACIDVEN 1.2    ABG Recent Labs  Lab 12/20/17 1135 12/20/17 1227 12/20/17 1645  PHART 7.264* 7.216* 7.287*  PCO2ART 45.4 53.5* 42.3  PO2ART 80.0* 87.8 126*    Liver Enzymes Recent Labs  Lab 12/20/17 0415 12/20/17 0912 12/22/17 0450  AST 17 14* 16  ALT 14 14 13*  ALKPHOS 72 81 85  BILITOT 0.4 0.5 0.4  ALBUMIN 2.0* 2.0* 1.7*    Cardiac Enzymes Recent Labs  Lab 12/15/17 1909 12/16/17 0019 12/16/17 0619  TROPONINI 0.21* 0.11* 0.06*    Glucose Recent Labs  Lab 12/21/17 0016 12/21/17 0534 12/21/17 1138 12/21/17 1749 12/21/17 2344 12/22/17 0606  GLUCAP 129* 167* 114* 117* 125* 112*    Imaging Dg Chest Port 1 View  Result Date: 12/22/2017 CLINICAL DATA:  Respiratory failure EXAM: PORTABLE CHEST 1 VIEW COMPARISON:  12/20/2017 FINDINGS: Endotracheal tube is unchanged. NG tube coils in the proximal stomach with the distal aspect of this tube re entering the esophagus with the tip in the midthoracic esophagus. This likely will need to be completely removed and replaced to straightening. Cardiomegaly with vascular congestion. Low lung volumes with bibasilar atelectasis. Possible small layering effusions. IMPRESSION: NG  tube remains looped with the distal aspect of the tip re entering the esophagus and the tip in the midthoracic esophagus. This likely will need to be completely removed and replaced to reduce the loop. Cardiomegaly, vascular congestion. Low lung volumes with bibasilar atelectasis and small effusions. Electronically Signed   By: Rolm Baptise M.D.   On: 12/22/2017 09:34    STUDIES:    CULTURES: Peritoneal fluid 4/9 >> negative  BCx2 4/11 >> negative  Sputum 4/16 >>   ANTIBIOTICS: Unasyn 4/11 >> 4/18  SIGNIFICANT EVENTS: 4/05  Admit  4/16  PCCM consulted for respiratory failure   LINES/TUBES: RUE PICC 4/11 >>  ETT 4/11 >>   DISCUSSION: 62 y/o unfortunate female with advanced cancer and failure to recover from initial debulking surgery. While long-term prognosis is poor the hope would be to get her over the initial complications of the surgery and provide her with some quality of life. The patient and her sister would like aggressive care to continue, so we proceeded with intubation.  The outcome is not guaranteed and Palliative Care's ongoing involvement to continue an ongoing discussion of goals of care is appreciated.  ASSESSMENT / PLAN:  PULMONARY A: Acute Hypoxic Respiratory Failure - due to aspiration pneumonia requiring intubation. P:   PRVC 8 cc/kg  Wean PEEP / FiO2 for sats > 90% Delay extubation until - SBO under control / high risk for aspiration Follow intermittent CXR PRN albuterol Prior plan as below from 4/17.  "Have advised the patient's sister [name]) that 7 days would be a reasonable time frame to expect improvement and extubation and that ventilator dependence beyond that time would portend a very poor prognosis. We agreed that goals of care would be readdressed at that point. Likewise, she agreed to no ACLS in case of cardiac arrest and that we might also readdress goals of care for significant escalations in care such pressors, dialysis  etc."  CARDIOVASCULAR A:  Atrial fibrillation - resolved with mechanical ventilation and sedation. P:  Tele monitoring  Continue heparin gtt per pharmacy  LCB - no CPR or ACLS  RENAL A:   AKI onStage III CKD - clinically volume overloaded since admission with edema, particularly of lower extremities.  Daily + fluid balance. P:   Trend BMP / urinary output Replace electrolytes as indicated Avoid nephrotoxic agents, ensure adequate renal perfusion D5w with 3 amps bicarb at 39ml/hr  GASTROINTESTINAL A:   SBO - conservatively managed with NG suctioning, resolved as of 4/17 KUB  Moderate Malnutrition   P:   NPO  Discontinue NGT, replace as is coiled in esophagus  Monitor LFT's while on TPN   Nutrition following  Consider trickle feeding 4/19   HEMATOLOGIC A:   Anemia - suspect of chronic disease.  Thrombocytosis - likely reactive  Malignancy related and post operative PE.  P:  Continue IV heparin  Trend  CBC    INFECTIOUS A:   Leukocytosis secondary to aspiration pneumonia.    P:   Trend WBC / fever curve  Monitor off abx  Follow cultures   ENDOCRINE A:   At Risk Hyperglycemia - on TPN P:   Monitor glucose   NEUROLOGIC A:   No delirium pre-intubation. Comfortable on current sedation regimen P:   RASS goal: 0 to -1  Fentanyl gtt for pain control  Propofol for sedation  Passive ROM efforts    FAMILY  - Updates:  No family at bedside 4/18  - CODE STATUS: Partial code (no CPR, ACLS).  See above discussion.   CC Time: 30 minutes   Noe Gens, NP-C [facility] Pgr: [phone] or if no answer [phone] 12/22/2017, 12:35 PM

## 2017-12-22 NOTE — Progress Notes (Signed)
Noted documentation from Dr. Lynetta Mare I will sign off Please call if questions arise

## 2017-12-22 NOTE — Progress Notes (Signed)
Nutrition Follow-up  DOCUMENTATION CODES:   Morbid obesity, Non-severe (moderate) malnutrition in context of acute illness/injury  INTERVENTION:  - Continue TPN/TPN advancement per Pharmacy. - Will monitor Propofol rate/changes.   NUTRITION DIAGNOSIS:   Inadequate oral intake related to inability to eat as evidenced by NPO status. -ongoing  GOAL:   Provide needs based on ASPEN/SCCM guidelines -unmet for protein  MONITOR:   Vent status, Weight trends, Labs, Other (Comment)(TPN regimen)  ASSESSMENT:   62 y.o.  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 She also has acute blood loss anemia.  Weight initially trending up but now trending back down. Current weight +24 lbs/10.9 kg compared to admission weight. Pt remains intubated, sedated, with NGT to LIS. She has ~150 mL bile-like output at this time. RN at bedside and reports that pt has been putting out bile large amounts of bile. Reviewing flow sheet on [facility] indicates 400 mL output yesterday. RN also reports Propofol started overnight d/t increased respiratory rate and heart rate. She turned Propofol rate down while RD was in the room.   She has a double lumen PICC and is receiving Clinimix E 5/15 @ 40 mL/hr with ILE on hold per ICU protocol. This regimen is providing 48 grams of protein and 682 kcal. Spoke with Pharmacist who reports plan to increase TPN rate today but new rate not yet determined.   Patient is currently intubated on ventilator support MV: 12.8 L/min Temp (24hrs), Avg:98.2 F (36.8 C), Min:97.3 F (36.3 C), Max:99.5 F (37.5 C) Propofol: 14.8 ml/hr (391 kcal) BP: 100/55 and MAP: 64  Medications reviewed; 20 mg IV Pepcid BID, sliding scale Novolog. Labs reviewed; CBG: 112 mg/dL this AM, Cl: 113 mmol/L, creatinine: 1.87 mg/dL, Ca: 8.2 mg/dL, GFR: 33.  IVF: D5-150 mEq sodium  bicarb @ 50 mL/hr (204 kcal).  Drips: Propofol @ 20 mcg/kg/min, Heparin @ 1850 units/hr, Fentanyl @ 250 mcg/hr.      Diet Order:  Diet NPO time specified TPN (CLINIMIX-E) Adult  EDUCATION NEEDS:   No education needs have been identified at this time  Skin:  Skin Assessment: Reviewed RN Assessment  Last BM:  4/16  Height:   Ht Readings from Last 1 Encounters:  12/20/17 5\' 4"  (1.626 m)    Weight:   Wt Readings from Last 1 Encounters:  12/20/17 266 lb 15.6 oz (121.1 kg)    Ideal Body Weight:  54.5 kg  BMI:  Body mass index is 45.83 kg/m.  Estimated Nutritional Needs:   Kcal:  4580-9983 (11-14 kcal/kg actual body weight)  Protein:  >/= 136 grams (2.5 grams/kg IBW)  Fluid:  1.7L/day      Jarome Matin, MS, RD, LDN, CNSC Inpatient Clinical Dietitian Pager # [phone] After hours/weekend pager # [phone]

## 2017-12-22 NOTE — Progress Notes (Signed)
Robyn Guerra is a 62 y.o. female patient. 1. Acute prerenal failure ([place])   2. Acute renal failure with tubular necrosis (HCC)   3. Other constipation   4. Malignant ascites   5. Pain of upper abdomen   6. Emesis   7. Uterine cancer ([place])   8. Abdominal pain   9. SBO (small bowel obstruction) ([place] [employer])   10. Small bowel obstruction ([place])   11. Hypoxia   12. Encounter for orogastric (OG) tube placement   13. Intubation of airway performed without difficulty   14. Encounter for imaging study to confirm orogastric (OG) tube placement   15. Respiratory failure ([place])   16. Acute respiratory failure with hypoxia [facility])    Past Medical History:  Diagnosis Date  . Cancer [facility])    endometrial cancer  . Fibroids   . GERD (gastroesophageal reflux disease)   . Hypertension   . Sinusitis    Current Facility-Administered Medications  Medication Dose Route Frequency Provider Last Rate Last Dose  . Marland KitchenTPN (CLINIMIX-E) Adult   Intravenous Continuous TPN Shade, Christine E, RPH      . albuterol (PROVENTIL) (2.5 MG/3ML) 0.083% nebulizer solution 2.5 mg  2.5 mg Nebulization Q4H PRN Agarwala, Ravi, MD      . chlorhexidine gluconate (MEDLINE KIT) (PERIDEX) 0.12 % solution 15 mL  15 mL Mouth Rinse BID Kipp Brood, MD   15 mL at 12/22/17 0918  . Chlorhexidine Gluconate Cloth 2 % PADS 6 each  6 each Topical Daily Dessa Phi, DO   6 each at 12/22/17 [phone]  . famotidine (PEPCID) IVPB 20 mg premix  20 mg Intravenous Q12H Dessa Phi, DO   Stopped at 12/22/17 1025  . feeding supplement (VITAL HIGH PROTEIN) liquid 1,000 mL  1,000 mL Per Tube Q24H Agarwala, Ravi, MD      . fentaNYL (SUBLIMAZE) bolus via infusion 50 mcg  50 mcg Intravenous Q1H PRN Kipp Brood, MD   50 mcg at 12/21/17 2000  . fentaNYL 2535mg in NS 2522m(1065mml) infusion-PREMIX  25-400 mcg/hr Intravenous Continuous ChoDessa PhiO 25 mL/hr at 12/22/17 1237 250 mcg/hr at 12/22/17 1237  . furosemide (LASIX) injection 60 mg  60 mg  Intravenous Once Agarwala, RavEinar GradD      . guaiFENesin-dextromethorphan (ROBITUSSIN DM) 100-10 MG/5ML syrup 5 mL  5 mL Oral Q4H PRN KimJani GravelD   5 mL at 12/19/17 2346  . heparin ADULT infusion 100 units/mL (25000 units/250m62mdium chloride 0.45%)  1,950 Units/hr Intravenous Continuous Shade, Christine E, RPH 19.5 mL/hr at 12/22/17 1550 1,950 Units/hr at 12/22/17 1550  . hydrALAZINE (APRESOLINE) injection 5 mg  5 mg Intravenous Q4H PRN ChoiDessa Phi      . insulin aspart (novoLOG) injection 0-9 Units  0-9 Units Subcutaneous Q6H GreeMinda DittoH   1 Units at 12/22/17 0027  . lidocaine (XYLOCAINE) 1 % (with pres) injection    PRN Allred, Darrell K, PA-C   10 mL at 12/15/17 1141  . LORazepam (ATIVAN) injection 0.5 mg  0.5 mg Intravenous Q6H PRN PoweElodia FlorenceD   0.5 mg at 12/22/17 1416  . MEDLINE mouth rinse  15 mL Mouth Rinse 10 times per day AgarKipp Brood   15 mL at 12/22/17 1550  . phenol (CHLORASEPTIC) mouth spray 1 spray  1 spray Mouth/Throat PRN ChoiDessa Phi   1 spray at 12/19/17 2346  . propofol (DIPRIVAN) 1000 MG/100ML infusion  5-80 mcg/kg/min Intravenous Titrated Ogan, OkorKerry Kass 21.8 mL/hr  at 12/22/17 1636 30 mcg/kg/min at 12/22/17 1636  . sodium bicarbonate 150 mEq in dextrose 5 % 1,000 mL infusion   Intravenous Continuous Kipp Brood, MD 50 mL/hr at 12/22/17 1217    . sodium chloride flush (NS) 0.9 % injection 10-40 mL  10-40 mL Intracatheter Q12H Dessa Phi, DO   10 mL at 12/22/17 0918  . sodium chloride flush (NS) 0.9 % injection 10-40 mL  10-40 mL Intracatheter PRN Dessa Phi, DO      . TPN (CLINIMIX-E) Adult   Intravenous Continuous TPN Minda Ditto, RPH 40 mL/hr at 12/21/17 1711     Allergies  Allergen Reactions  . Codeine Nausea And Vomiting    Room spins   Principal Problem:   Acute prerenal failure (HCC) Active Problems:   Endometrial cancer (HCC)   Morbid obesity with BMI of 45.0-49.9, adult (HCC)   Secondary malignant  neoplasm of omentum (HCC)   Peritoneal carcinomatosis (HCC)   Iron deficiency anemia due to chronic blood loss   Wound dehiscence, surgical, initial encounter   Abdominal pain   Other constipation   Renal failure, acute (HCC)   Renal failure   Malnutrition of moderate degree  Blood pressure (!) 115/46, pulse 83, temperature 99.9 F (37.7 C), resp. rate (!) 27, height _0  (1.626 m), weight 282 lb 6.6 oz (128.1 kg), last menstrual period 04/20/2016, SpO2 99 %.  Subjective  Intubated. Sister states she has episodes of being alert and trying to communicate.  Vitals:   12/22/17 1030 12/22/17 1100 12/22/17 1130 12/22/17 1208  BP: (!) 114/59 (!) 111/45 (!) 115/46   Pulse: 80 81 84 83  Resp: (!) 30 (!) 34 (!) 29 (!) 27  Temp: 99.7 F (37.6 C) 99.7 F (37.6 C) 99.9 F (37.7 C)   TempSrc:      SpO2: 93% 96% 97% 99%  Weight:   282 lb 6.6 oz (128.1 kg)   Height:       Abdo diminished BS, soft, moderate distension. Unable to determine tender. Wound CDI  Assessment & Plan Robyn Guerra  is a 62 y.o.  year old with stage IV carcinosarcoma of the uterus s/p ex lap, TAH, BSO, omentectomy, radical tumor debulking on 11/24/17 readmitted with a prolonged postoperative ileus, PE, Afib, AKI and aspiration PNA.   NGT was malpositioned on last few films and has now been replaced (but oral gastric position now).  Mention in notes of coming off of antibiotics by NP today and following wbc/temps/cultures. Note that I discussed this with the sister who sounded like she was not aware of this plan.  Plan of care per ICU/Critical care.  Will continue to follow.  Isabel Caprice 12/22/2017

## 2017-12-23 ENCOUNTER — Inpatient Hospital Stay (HOSPITAL_COMMUNITY): Payer: BLUE CROSS/BLUE SHIELD

## 2017-12-23 DIAGNOSIS — J9601 Acute respiratory failure with hypoxia: Secondary | ICD-10-CM

## 2017-12-23 LAB — BASIC METABOLIC PANEL
ANION GAP: 9 (ref 5–15)
BUN: 27 mg/dL — ABNORMAL HIGH (ref 6–20)
CALCIUM: 8.1 mg/dL — AB (ref 8.9–10.3)
CHLORIDE: 110 mmol/L (ref 101–111)
CO2: 23 mmol/L (ref 22–32)
CREATININE: 2.33 mg/dL — AB (ref 0.44–1.00)
GFR calc non Af Amer: 22 mL/min — ABNORMAL LOW (ref >60)
GFR, EST AFRICAN AMERICAN: 25 mL/min — AB (ref >60)
Glucose, Bld: 123 mg/dL — ABNORMAL HIGH (ref 65–99)
Potassium: 3.6 mmol/L (ref 3.5–5.1)
SODIUM: 142 mmol/L (ref 135–145)

## 2017-12-23 LAB — GLUCOSE, CAPILLARY
GLUCOSE-CAPILLARY: 108 mg/dL — AB (ref 65–99)
GLUCOSE-CAPILLARY: 117 mg/dL — AB (ref 65–99)
GLUCOSE-CAPILLARY: 126 mg/dL — AB (ref 65–99)
GLUCOSE-CAPILLARY: 128 mg/dL — AB (ref 65–99)
GLUCOSE-CAPILLARY: 143 mg/dL — AB (ref 65–99)
Glucose-Capillary: 124 mg/dL — ABNORMAL HIGH (ref 65–99)

## 2017-12-23 LAB — CBC
HEMATOCRIT: 22.6 % — AB (ref 36.0–46.0)
HEMOGLOBIN: 7 g/dL — AB (ref 12.0–15.0)
MCH: 22.9 pg — ABNORMAL LOW (ref 26.0–34.0)
MCHC: 31 g/dL (ref 30.0–36.0)
MCV: 73.9 fL — ABNORMAL LOW (ref 78.0–100.0)
Platelets: 434 10*3/uL — ABNORMAL HIGH (ref 150–400)
RBC: 3.06 MIL/uL — ABNORMAL LOW (ref 3.87–5.11)
RDW: 19.6 % — AB (ref 11.5–15.5)
WBC: 16 10*3/uL — AB (ref 4.0–10.5)

## 2017-12-23 LAB — HEPARIN LEVEL (UNFRACTIONATED): HEPARIN UNFRACTIONATED: 0.37 [IU]/mL (ref 0.30–0.70)

## 2017-12-23 MED ORDER — DEXMEDETOMIDINE HCL IN NACL 200 MCG/50ML IV SOLN
0.4000 ug/kg/h | INTRAVENOUS | Status: DC
  Administered 2017-12-23: 0.8 ug/kg/h via INTRAVENOUS
  Administered 2017-12-23: 0.9 ug/kg/h via INTRAVENOUS
  Administered 2017-12-23: 1.2 ug/kg/h via INTRAVENOUS
  Administered 2017-12-23: 0.8 ug/kg/h via INTRAVENOUS
  Administered 2017-12-23: 1.2 ug/kg/h via INTRAVENOUS
  Filled 2017-12-23 (×4): qty 50
  Filled 2017-12-23: qty 100
  Filled 2017-12-23: qty 50

## 2017-12-23 MED ORDER — TRACE MINERALS CR-CU-MN-SE-ZN 10-1000-500-60 MCG/ML IV SOLN
INTRAVENOUS | Status: AC
  Administered 2017-12-23: 18:00:00 via INTRAVENOUS
  Filled 2017-12-23: qty 1992

## 2017-12-23 MED ORDER — METOCLOPRAMIDE HCL 5 MG/ML IJ SOLN
5.0000 mg | Freq: Four times a day (QID) | INTRAMUSCULAR | Status: AC
  Administered 2017-12-23 (×3): 5 mg via INTRAVENOUS
  Filled 2017-12-23 (×3): qty 2

## 2017-12-23 MED ORDER — DEXMEDETOMIDINE HCL IN NACL 400 MCG/100ML IV SOLN
0.4000 ug/kg/h | INTRAVENOUS | Status: DC
  Administered 2017-12-23: 1.2 ug/kg/h via INTRAVENOUS
  Administered 2017-12-24: 1.4 ug/kg/h via INTRAVENOUS
  Administered 2017-12-24: 1.2 ug/kg/h via INTRAVENOUS
  Administered 2017-12-24: 1.7 ug/kg/h via INTRAVENOUS
  Administered 2017-12-24: 1.2 ug/kg/h via INTRAVENOUS
  Administered 2017-12-24: 1.7 ug/kg/h via INTRAVENOUS
  Administered 2017-12-24: 1.6 ug/kg/h via INTRAVENOUS
  Administered 2017-12-24: 1.1 ug/kg/h via INTRAVENOUS
  Administered 2017-12-24: 1.7 ug/kg/h via INTRAVENOUS
  Administered 2017-12-25 (×2): 1.8 ug/kg/h via INTRAVENOUS
  Administered 2017-12-25: 1.7 ug/kg/h via INTRAVENOUS
  Administered 2017-12-25 (×2): 1.8 ug/kg/h via INTRAVENOUS
  Administered 2017-12-25 (×2): 1.7 ug/kg/h via INTRAVENOUS
  Administered 2017-12-25: 1.8 ug/kg/h via INTRAVENOUS
  Administered 2017-12-25 (×2): 1.7 ug/kg/h via INTRAVENOUS
  Administered 2017-12-25: 1.8 ug/kg/h via INTRAVENOUS
  Administered 2017-12-26 (×6): 1.7 ug/kg/h via INTRAVENOUS
  Administered 2017-12-26 (×2): 1.8 ug/kg/h via INTRAVENOUS
  Administered 2017-12-26 – 2017-12-27 (×5): 1.7 ug/kg/h via INTRAVENOUS
  Administered 2017-12-27 (×7): 1.8 ug/kg/h via INTRAVENOUS
  Administered 2017-12-27: 1.7 ug/kg/h via INTRAVENOUS
  Administered 2017-12-27 – 2017-12-28 (×5): 1.8 ug/kg/h via INTRAVENOUS
  Administered 2017-12-28 (×4): 1.7 ug/kg/h via INTRAVENOUS
  Administered 2017-12-28: 1.8 ug/kg/h via INTRAVENOUS
  Administered 2017-12-28 (×7): 1.7 ug/kg/h via INTRAVENOUS
  Administered 2017-12-29 (×2): 1.8 ug/kg/h via INTRAVENOUS
  Administered 2017-12-29: 1.7 ug/kg/h via INTRAVENOUS
  Administered 2017-12-29: 1.8 ug/kg/h via INTRAVENOUS
  Administered 2017-12-29: 1.7 ug/kg/h via INTRAVENOUS
  Administered 2017-12-29: 1.8 ug/kg/h via INTRAVENOUS
  Filled 2017-12-23 (×4): qty 100
  Filled 2017-12-23: qty 200
  Filled 2017-12-23 (×5): qty 100
  Filled 2017-12-23: qty 200
  Filled 2017-12-23 (×10): qty 100
  Filled 2017-12-23: qty 200
  Filled 2017-12-23 (×10): qty 100
  Filled 2017-12-23: qty 200
  Filled 2017-12-23 (×3): qty 100
  Filled 2017-12-23: qty 200
  Filled 2017-12-23 (×2): qty 100
  Filled 2017-12-23: qty 200
  Filled 2017-12-23 (×7): qty 100
  Filled 2017-12-23: qty 200
  Filled 2017-12-23 (×3): qty 100
  Filled 2017-12-23: qty 200
  Filled 2017-12-23 (×6): qty 100

## 2017-12-23 MED ORDER — FUROSEMIDE 10 MG/ML IJ SOLN
80.0000 mg | Freq: Once | INTRAMUSCULAR | Status: AC
  Administered 2017-12-23: 80 mg via INTRAVENOUS
  Filled 2017-12-23: qty 8

## 2017-12-23 NOTE — Progress Notes (Signed)
PULMONARY / CRITICAL CARE MEDICINE   Name: Robyn Guerra MRN: [id number] DOB: June 07, 1956    ADMISSION DATE:  12/07/2017 CONSULTATION DATE:  12/20/2017  REFERRING MD:  Maylene Roes - [facility].   CHIEF COMPLAINT:  Dyspnea.  BRIEF SUMMARY:   62 year old woman with stage IV uterine cancer s/p ex lap, TAH, BSO, omentectomy, radical tumor debulking on 11/24/17.  Went home with sister and was readmitted with postop AKI, and pain. Underwent paracentesis of malignant ascites with improvement in symptoms.    While planning for palliative chemotherapy, the patient developed AF related to PE, SBO and aspiration pneumonia. While initially improved, she coughed out NGT 4/15 and vomited and aspirated. By the morning of 4/16, PCCM consulted for increasing dyspnea.   The patient has been informed of her poor long-term prognosis, but is still with her sister requesting intubation. To the extent that some of her current condition may be related to her immediate post-operative state, a trial of short term intubation is reasonable.   SUBJECTIVE:   Remains sedated, intubated UO improved with lasix but remains positive TFs stopped due to bile noted in secretions    VITAL SIGNS: BP 135/66   Pulse 88   Temp 99.7 F (37.6 C)   Resp (!) 27   Ht 5\' 4"  (1.626 m)   Wt 278 lb 14.1 oz (126.5 kg)   LMP 04/20/2016   SpO2 92%   BMI 47.87 kg/m   HEMODYNAMICS:    VENTILATOR SETTINGS: Vent Mode: PRVC FiO2 (%):  [40 %-50 %] 40 % Set Rate:  [24 bmp] 24 bmp Vt Set:  [440 mL] 440 mL PEEP:  [5 cmH20] 5 cmH20 Plateau Pressure:  [20 cmH20-24 cmH20] 24 cmH20  INTAKE / OUTPUT: I/O last 3 completed shifts: In: 6131.2 [I.V.:5736.5; Other:10; NG/GT:34.7; IV Piggyback:350] Out: 2400 [Urine:2205; Emesis/NG output:75; Other:120]  PHYSICAL EXAMINATION: General: critically ill appearing female in NAD on vent  HEENT: MM pink/moist, ETT Neuro: Sedate on prop, fent, RASS-4 CV: s1s2 rrr, no m/r/g PULM:  even/non-labored, lungs bilaterally diminished  BJ:SEGB, non-tender, bsx4 active  Extremities: warm/dry, 1-2+ generalized edema  Skin: no rashes or lesions  LABS:  BMET Recent Labs  Lab 12/21/17 0535 12/22/17 0450 12/23/17 0442  NA 142 142 142  K 3.7 3.6 3.6  CL 112* 113* 110  CO2 19* 21* 23  BUN 19 21* 27*  CREATININE 1.96* 1.87* 2.33*  GLUCOSE 148* 120* 123*    Electrolytes Recent Labs  Lab 12/20/17 0912 12/21/17 0535 12/22/17 0450 12/23/17 0442  CALCIUM 8.1* 8.2* 8.2* 8.1*  MG 2.2 2.2 2.3  --   PHOS 5.0* 3.7 4.2  --     CBC Recent Labs  Lab 12/21/17 0535 12/22/17 0450 12/23/17 0442  WBC 18.6* 16.5* 16.0*  HGB 8.0* 7.6* 7.0*  HCT 26.3* 25.1* 22.6*  PLT 469* 438* 434*    Coag's No results for input(s): APTT, INR in the last 168 hours.  Sepsis Markers No results for input(s): LATICACIDVEN, PROCALCITON, O2SATVEN in the last 168 hours.  ABG Recent Labs  Lab 12/20/17 1135 12/20/17 1227 12/20/17 1645  PHART 7.264* 7.216* 7.287*  PCO2ART 45.4 53.5* 42.3  PO2ART 80.0* 87.8 126*    Liver Enzymes Recent Labs  Lab 12/20/17 0415 12/20/17 0912 12/22/17 0450  AST 17 14* 16  ALT 14 14 13*  ALKPHOS 72 81 85  BILITOT 0.4 0.5 0.4  ALBUMIN 2.0* 2.0* 1.7*    Cardiac Enzymes No results for input(s): TROPONINI, PROBNP in the last  168 hours.  Glucose Recent Labs  Lab 12/22/17 0606 12/22/17 1122 12/22/17 1738 12/22/17 2309 12/23/17 0552 12/23/17 0754  GLUCAP 112* 98 112* 107* 108* 126*    Imaging Dg Abd 1 View  Result Date: 12/23/2017 CLINICAL DATA:  Small-bowel obstruction EXAM: ABDOMEN - 1 VIEW COMPARISON:  12/22/2017 FINDINGS: Gastric tube remains in the stomach unchanged in position. Nonobstructive bowel gas pattern. Contrast in the colon from prior CT. IMPRESSION: NG in the stomach.  Normal bowel gas pattern. Electronically Signed   By: Franchot Gallo M.D.   On: 12/23/2017 08:03   Dg Abd 1 View  Result Date: 12/22/2017 CLINICAL DATA:   OG tube placement. EXAM: ABDOMEN - 1 VIEW COMPARISON:  12/21/2017 FINDINGS: An OG tube is noted with tip overlying the distal stomach. No other changes identified. IMPRESSION: OG tube with tip overlying the distal stomach. Electronically Signed   By: Margarette Canada M.D.   On: 12/22/2017 15:50   Dg Chest Port 1 View  Result Date: 12/23/2017 CLINICAL DATA:  Hypoxia EXAM: PORTABLE CHEST 1 VIEW COMPARISON:  December 22, 2017 FINDINGS: Endotracheal tube tip is 2.8 cm above the carina. Central catheter tip is in the superior vena cava. Nasogastric tube tip and side port are below the diaphragm. No pneumothorax. There is airspace consolidation throughout the right lower lobe, increased. There is patchy infiltrate in the left lower lobe, stable. There are small pleural effusions bilaterally. There is cardiomegaly with pulmonary vascularity within normal limits. No adenopathy. No bone lesions. IMPRESSION: Tube and catheter positions as described without pneumothorax. Increase in right lower lobe consolidation. A lesser degree of consolidation in the left base is stable. Small pleural effusions bilaterally are stable. Stable cardiomegaly. Electronically Signed   By: Lowella Grip III M.D.   On: 12/23/2017 07:50    STUDIES:    CULTURES: Peritoneal fluid 4/9 >> negative  BCx2 4/11 >> negative  Sputum 4/16 >> ng  ANTIBIOTICS: Unasyn 4/11 >> 4/18  SIGNIFICANT EVENTS: 4/05  Admit  4/16  PCCM consulted for respiratory failure   LINES/TUBES: RUE PICC 4/11 >>  ETT 4/16 >>   DISCUSSION: 62 y/o unfortunate female with advanced cancer s/p debulking surgery , intubated 4/16 for aspiration pneumonia Current concerns are dropping Hb, inability to tolerate TFs & agitation  ASSESSMENT / PLAN:  PULMONARY A: Acute Hypoxic Respiratory Failure - due to aspiration pneumonia requiring intubation. P:   PRVC 8 cc/kg  Daily WUA/SBts - did not tolerate today Follow intermittent CXR PRN  albuterol     CARDIOVASCULAR A:  Atrial fibrillation - resolved with mechanical ventilation and sedation. P:  Tele monitoring  Continue heparin gtt per pharmacy , may have to hold if Hb drops further LCB - no CPR or ACLS  RENAL A:   AKI onStage III CKD - clinically volume overloaded since admission with edema, particularly of lower extremities.  Remains in + fluid balance. P:   Trend BMP / urinary output Replace electrolytes as indicated Avoid nephrotoxic agents, ensure adequate renal perfusion dc bicarb gtt Lasix 80 x 1 for equal balance & follow cr  GASTROINTESTINAL A:   SBO - conservatively managed with NG suctioning, resolved as of 4/17 KUB  Moderate Malnutrition   P:   NPO  Monitor LFT's while on TPN   reglan 5 mg X 3 doses , then restart trickle feeding 4/19   HEMATOLOGIC A:   Anemia - suspect of chronic disease.  Thrombocytosis - likely reactive  Malignancy related and post operative PE.  P:  Continue IV heparin ,, may have to hold if Hb drops further Trend CBC     INFECTIOUS A:   Leukocytosis secondary to aspiration pneumonia.    P:   Trend WBC / fever curve  Monitor off abx    ENDOCRINE A:   At Risk Hyperglycemia - on TPN P:   Monitor glucose   NEUROLOGIC A:   No delirium pre-intubation. Comfortable on current sedation regimen P:   RASS goal: 0 to -1  Fentanyl gtt for pain control  Change from Propofol back to precedex to enable weaning Passive ROM efforts      FAMILY  - Updates:The patient and her sister desired aggressive care t, so we proceeded with intubation. per d/w patient's sister [name]) -readdress GOC at 7 days. While long-term prognosis is poor the hope would be to liberate her from vent  and provide her with some quality of life.   - CODE STATUS: Partial code (no CPR, ACLS).  See above discussion.   The patient is critically ill with multiple organ systems failure and requires high complexity decision making for assessment  and support, frequent evaluation and titration of therapies, application of advanced monitoring technologies and extensive interpretation of multiple databases. Critical Care Time devoted to patient care services described in this note independent of APP/resident  time is 35 minutes.   Kara Mead MD. Shade Flood. [facility] Pager [phone] If no response call [phone]     12/23/2017, 11:11 AM

## 2017-12-23 NOTE — Progress Notes (Signed)
Pt had 50cc of green bile suctioned from mouth w/ yankeur & approximately 50cc of bile drain from mouth upon turning pt to bathe.  Per E-link MD: hold TF & connect OGT to LIS until approximately 0600. TF can then be restarted.

## 2017-12-23 NOTE — Progress Notes (Signed)
ANTICOAGULATION CONSULT NOTE - Follow Up Consult  Pharmacy Consult for Heparin Indication: atrial fibrillation and pulmonary embolus  Allergies  Allergen Reactions  . Codeine Nausea And Vomiting    Room spins    Patient Measurements: Height: 5\' 4"  (162.6 cm) Weight: 278 lb 14.1 oz (126.5 kg) IBW/kg (Calculated) : 54.7 Heparin Dosing Weight: 86.3 kg   Vital Signs: Temp: 99.7 F (37.6 C) (04/19 0600) BP: 135/66 (04/19 0600) Pulse Rate: 93 (04/19 0600)  Labs: Recent Labs    12/21/17 0535  12/22/17 0450 12/22/17 1343 12/22/17 2123 12/23/17 0442  HGB 8.0*  --  7.6*  --   --  7.0*  HCT 26.3*  --  25.1*  --   --  22.6*  PLT 469*  --  438*  --   --  434*  HEPARINUNFRC  --    < > 0.27* 0.30 0.37 0.37  CREATININE 1.96*  --  1.87*  --   --  2.33*   < > = values in this interval not displayed.    Estimated Creatinine Clearance: 33.8 mL/min (A) (by C-G formula based on SCr of 2.33 mg/dL (H)).   Medications:  Infusions:  . Marland KitchenTPN (CLINIMIX-E) Adult 60 mL/hr at 12/22/17 1730  . famotidine (PEPCID) IV Stopped (12/22/17 2205)  . fentaNYL infusion INTRAVENOUS 200 mcg/hr (12/23/17 0700)  . heparin 1,950 Units/hr (12/22/17 1550)  . propofol (DIPRIVAN) infusion 30 mcg/kg/min (12/22/17 2115)  .  sodium bicarbonate  infusion 1000 mL 50 mL/hr at 12/22/17 1217    Assessment: 30 YOF with stage IV uterine cancer.  She underwent ex lap with TAH, BSO, omentectomy and debulking on 3/21.  CT chest re-reviewed and determined to have RUL PE.   Pharmacy is consulted to dose heparin gtt.    Today, 12/23/2017: Heparin level 0.37 is therapeutic on Heparin at 1950 units/hr CBC:  Hgb decreased to 7, Plt remain elevated at 434 No bleeding or infusion complications reported Renal: SCr increased to 2.33  Goal of Therapy:  Heparin level 0.3-0.7 units/ml Monitor platelets by anticoagulation protocol: Yes   Plan:  Continue heparin IV infusion at 1950 units/hr Daily heparin level and CBC Continue  to monitor H&H and platelets   Gretta Arab PharmD, BCPS Pager [phone] 12/23/2017,7:24 AM

## 2017-12-23 NOTE — Progress Notes (Addendum)
Patient seen.  Intubated and sedated at this time.  No family at the bedside. HR regular rhythm at 92 bpm.  Lungs diminished bilaterally.  Abd obese, hypoactive bowel sounds, NG in place.  Midline incision well healed.  1 cm opening at the lower aspect of the incision with no significant change from post-op follow up in the office prior to this admission.  No signs of infection from this area with no moderate drainage or erythema. RN advised to keep dry dressing over the opening.  Continue with plan of care per Critical Care Team and Hospitalist Team.  GYN ONC will continue to follow.  Joylene John, NP  Patient seen and examined, agree with above. Recommend support vent care for aspiration pneumonia with goal towards extubation.  The 0.5cm area of opening in her incision is minimal, is not infected, does not need packing or antibiotics and can be safely covered by a bandaid only.   Thereasa Solo, MD

## 2017-12-23 NOTE — Progress Notes (Addendum)
Nutrition Follow-up  DOCUMENTATION CODES:   Morbid obesity, Non-severe (moderate) malnutrition in context of acute illness/injury  INTERVENTION:   - Continue TPN/TPN advancement per pharmacy  - Will continue to monitor Propofol rate/changes  - If trickle feeds are restarted, RD recommends Vital High Protein @ 15 ml/hr  (goal TF regimen: Vital High Protein @ 15 ml/hr with 60 ml Pro-stat TID. With current propofol rate, goal TF regimen provides 1536 kcal and 132 grams protein)  NUTRITION DIAGNOSIS:   Inadequate oral intake related to inability to eat as evidenced by NPO status.  Ongoing  GOAL:   Provide needs based on ASPEN/SCCM guidelines  Unmet at this time with current TPN infusion rate  MONITOR:   Vent status, Labs, Weight trends, I & O's, Other (Comment)(TPN regimen, trickle TF tolerance if restarted)  REASON FOR ASSESSMENT:   Consult Enteral/tube feeding initiation and management  ASSESSMENT:   62 y.o.  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 She also has acute blood loss anemia.   Pt remains intubated and sedated.  Per flowsheet and pharmacy note, TPN was increased on 12/05/17 to 60 ml/hr. Per pharmacy, goal rate is Clinimix 5/15 @ 110 ml/hr. TPN infusing at 60 ml/hr at time of visit.  Per NP note, SBO resolved as of 12/21/17 KUB. OGT was coiled in the esophagus but was replaced and replacement found to be in the stomach.  RD consulted for trickle feedings. Per RN note, pt had 50 cc of green bile suctioned from mouth and approximately 50 cc of bile drain from mouth upon turning pt. TF was stopped and OGT was connected to LIS at approximately 12:00 am this morning. TF off at time of visit.  Spoke with RN who reports that there are no plans to restart TF at this time. Per pharmacy, pt's fluid status is of concern which makes  advancing TPN to goal rate difficult.  RD will leave TF recommendations if needed.  Patient is currently intubated on ventilator support MV: 11.3 L/min Temp (24hrs), Avg:99.9 F (37.7 C), Min:99.5 F (37.5 C), Max:100 F (37.8 C) BP: 119/48 MAP: 71  Propofol: 21.8 ml/hr (provides 576 kcal/day)  Medications reviewed and include: sliding scale Novolog, 20 mg IV Pepcid BID IVF: D5-150 mEq sodium bicarb @ 50 ml/r (204 kcal)  Drips: Fentanyl @ 200 mcg/hr Heparin @ 1950 units/hr Propofol @ 30 mcg/kg/min  Labs reviewed: BUN 27 (H), creatinine 2.33 (H), hemoglobin 7.0 (L), HCT 22.6 (L) CBG's: 126, 108, 107, 112, 98 x 24 hours  I/O's: +29.3 L since admission UOP: 1885 ml x 24 hours  Diet Order:  Diet NPO time specified .TPN (CLINIMIX-E) Adult  EDUCATION NEEDS:   No education needs have been identified at this time  Skin:  Skin Assessment: Reviewed RN Assessment  Last BM:  12/20/17 - large type 7  Height:   Ht Readings from Last 1 Encounters:  12/20/17 5\' 4"  (1.626 m)    Weight:   Wt Readings from Last 1 Encounters:  12/23/17 278 lb 14.1 oz (126.5 kg)    Ideal Body Weight:  54.5 kg  BMI:  Body mass index is 47.87 kg/m.  Estimated Nutritional Needs:   Kcal:  3790-2409 (11-14 kcal/kg actual body weight)  Protein:  >/= 136 grams (2.5 grams/kg IBW)  Fluid:  1.7L/day    Gaynell Face, MS, RD, LDN Pager: [phone] Weekend/After Hours: [phone]

## 2017-12-23 NOTE — Progress Notes (Signed)
PHARMACY - ADULT TOTAL PARENTERAL NUTRITION CONSULT NOTE   Pharmacy Consult for TPN Indication: SBO  Patient Measurements: Height: 5\' 4"  (162.6 cm) Weight: 278 lb 14.1 oz (126.5 kg) IBW/kg (Calculated) : 54.7 TPN AdjBW (KG): 68.6 Body mass index is 47.87 kg/m. Usual Weight: 30 lb weight loss per Dietitian notes 4/8  Insulin Requirements: None Sensitive Novolog scale q6hr No hx DM   Current Nutrition: NPO Propofol infusion started 4/17 PM - currently @ 21.8 ml/hr  IVF: D5-NaBicarb d/c.  Remains on heparin, pain/sedation continuous infusions.  Central access: PICC 4/11 TPN start date: 4/15  ASSESSMENT                                                                                       Recurrent Uterine Ca, liver mets, ascites, small bowel obstruction.  Significant events:  Today:   Glucose - decreasing despite increased TPN rate; no further SSI used / 24 hrs.  CBG range 98-112.  Serum glucose 123  Electrolytes - Na, K, CorrCa WNL.    Renal - SCr elevated and increased to 2.33    LFTs - wnl (4/18)  TGs - 68  Prealbumin  < 5 (4/16)  Oral output ([place] suction) 120 ml/24 hr, UOP increased to 1855 ml/24 hr after lasix 60mg  IV on 4/19  Volume overload:  I/O net +2L yesterday.  Weight 126.5 kg (baseline weight 110 kg on 4/5).  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  Total net I/O + 29 L since admission on 4/5.  Per MD, OK to continue to titrate TPN on 4/19, will give additional lasix today despite SCr.   NUTRITIONAL GOALS                                                                                             RD recs (4/16): Kcal:  2952-8413; Protein:  >/= 136 g  Based on published guidelines, while the patient meets ICU status the initiation of lipids is being delayed for 7 days or until transition out of ICU.  Clinimix 5/15 @ 110 mL/hr to provide 132 grams protein (97% of goal) and 1874 kcal (121 % of goal)  Glucose infusion rate will be ~2.1  mg/kg/min (Maximum 5 mg/kg/min)   PLAN  At 1800 today:  Increase to Clinimix E 5/15 at 83 ml/hr  HOLD lipids  Plan to advance as tolerated to the goal rate. Monitor fluid status closely.  TPN to contain standard multivitamins and trace elements.  Holding IVF per MD  Continue sensitive scale Novolog, CBGs q6hr  TPN lab panels on Mondays & Thursdays.  BMET daily.  Gretta Arab PharmD, BCPS Pager [phone] 12/23/2017, 7:33 AM

## 2017-12-24 ENCOUNTER — Inpatient Hospital Stay (HOSPITAL_COMMUNITY): Payer: BLUE CROSS/BLUE SHIELD

## 2017-12-24 LAB — GLUCOSE, CAPILLARY
GLUCOSE-CAPILLARY: 148 mg/dL — AB (ref 65–99)
GLUCOSE-CAPILLARY: 144 mg/dL — AB (ref 65–99)
Glucose-Capillary: 124 mg/dL — ABNORMAL HIGH (ref 65–99)
Glucose-Capillary: 135 mg/dL — ABNORMAL HIGH (ref 65–99)
Glucose-Capillary: 138 mg/dL — ABNORMAL HIGH (ref 65–99)
Glucose-Capillary: 138 mg/dL — ABNORMAL HIGH (ref 65–99)
Glucose-Capillary: 143 mg/dL — ABNORMAL HIGH (ref 65–99)

## 2017-12-24 LAB — CULTURE, RESPIRATORY

## 2017-12-24 LAB — BASIC METABOLIC PANEL
Anion gap: 11 (ref 5–15)
BUN: 32 mg/dL — ABNORMAL HIGH (ref 6–20)
CALCIUM: 8.3 mg/dL — AB (ref 8.9–10.3)
CHLORIDE: 109 mmol/L (ref 101–111)
CO2: 23 mmol/L (ref 22–32)
CREATININE: 2.38 mg/dL — AB (ref 0.44–1.00)
GFR, EST AFRICAN AMERICAN: 24 mL/min — AB (ref >60)
GFR, EST NON AFRICAN AMERICAN: 21 mL/min — AB (ref >60)
Glucose, Bld: 151 mg/dL — ABNORMAL HIGH (ref 65–99)
Potassium: 3.5 mmol/L (ref 3.5–5.1)
Sodium: 143 mmol/L (ref 135–145)

## 2017-12-24 LAB — PHOSPHORUS: PHOSPHORUS: 5.6 mg/dL — AB (ref 2.5–4.6)

## 2017-12-24 LAB — CBC
HCT: 23.1 % — ABNORMAL LOW (ref 36.0–46.0)
Hemoglobin: 7.1 g/dL — ABNORMAL LOW (ref 12.0–15.0)
MCH: 22.5 pg — ABNORMAL LOW (ref 26.0–34.0)
MCHC: 30.7 g/dL (ref 30.0–36.0)
MCV: 73.3 fL — ABNORMAL LOW (ref 78.0–100.0)
PLATELETS: 416 10*3/uL — AB (ref 150–400)
RBC: 3.15 MIL/uL — AB (ref 3.87–5.11)
RDW: 19.4 % — ABNORMAL HIGH (ref 11.5–15.5)
WBC: 16 10*3/uL — ABNORMAL HIGH (ref 4.0–10.5)

## 2017-12-24 LAB — CULTURE, RESPIRATORY W GRAM STAIN

## 2017-12-24 LAB — MAGNESIUM: MAGNESIUM: 2.3 mg/dL (ref 1.7–2.4)

## 2017-12-24 LAB — HEPARIN LEVEL (UNFRACTIONATED): Heparin Unfractionated: 0.51 IU/mL (ref 0.30–0.70)

## 2017-12-24 MED ORDER — CLINIMIX/DEXTROSE (5/15) 5 % IV SOLN
INTRAVENOUS | Status: AC
  Administered 2017-12-24: 18:00:00 via INTRAVENOUS
  Filled 2017-12-24 (×2): qty 2000

## 2017-12-24 MED ORDER — MIDAZOLAM HCL 2 MG/2ML IJ SOLN
INTRAMUSCULAR | Status: AC
  Filled 2017-12-24: qty 2

## 2017-12-24 MED ORDER — POTASSIUM CHLORIDE 10 MEQ/100ML IV SOLN
10.0000 meq | INTRAVENOUS | Status: AC
  Administered 2017-12-24 (×2): 10 meq via INTRAVENOUS
  Filled 2017-12-24: qty 100

## 2017-12-24 MED ORDER — FUROSEMIDE 10 MG/ML IJ SOLN
80.0000 mg | Freq: Once | INTRAMUSCULAR | Status: AC
  Administered 2017-12-24: 80 mg via INTRAVENOUS
  Filled 2017-12-24: qty 8

## 2017-12-24 MED ORDER — MIDAZOLAM HCL 2 MG/2ML IJ SOLN
1.0000 mg | INTRAMUSCULAR | Status: DC | PRN
  Administered 2017-12-24 – 2017-12-27 (×20): 2 mg via INTRAVENOUS
  Administered 2017-12-27: 1 mg via INTRAVENOUS
  Administered 2017-12-28: 2 mg via INTRAVENOUS
  Filled 2017-12-24 (×22): qty 2

## 2017-12-24 NOTE — Progress Notes (Signed)
At 0555, this RN yankauer & subglottic suctioned the pt and noted that the drainage was no longer just bile, but tube feed colored. Suctioned a total of 50cc from the mouth via yankeur & 20cc from subglottic. Tube feed placed on hold at 0600.  Oncoming dayshift RN made aware of suction appearance & tube feeding being placed on hold.

## 2017-12-24 NOTE — Progress Notes (Signed)
PHARMACY - ADULT TOTAL PARENTERAL NUTRITION CONSULT NOTE   Pharmacy Consult for TPN Indication: SBO  Patient Measurements: Height: 5\' 4"  (162.6 cm) Weight: 283 lb 11.7 oz (128.7 kg) IBW/kg (Calculated) : 54.7 TPN AdjBW (KG): 68.6 Body mass index is 48.7 kg/m. Usual Weight: 30 lb weight loss per Dietitian notes 4/8  Insulin Requirements: 3 units SSI Novolog in past 24 hrs   Current Nutrition: TPN  Propofol infusion started 4/17 PM - currently @ 7.3 ml/hr  IVF: D5-NaBicarb d/c.  Remains on heparin, pain/sedation continuous infusions.  Central access: PICC 4/11 TPN start date: 4/15  Recent Labs    12/21/17 2118  12/22/17 0450 12/23/17 0442 12/24/17 0445  NA  --    < > 142 142 143  K  --    < > 3.6 3.6 3.5  CL  --    < > 113* 110 109  CO2  --    < > 21* 23 23  GLUCOSE  --    < > 120* 123* 151*  BUN  --    < > 21* 27* 32*  CREATININE  --    < > 1.87* 2.33* 2.38*  CALCIUM  --    < > 8.2* 8.1* 8.3*  PHOS  --   --  4.2  --  5.6*  MG  --   --  2.3  --  2.3  ALBUMIN  --   --  1.7*  --   --   ALKPHOS  --   --  85  --   --   AST  --   --  16  --   --   ALT  --   --  13*  --   --   BILITOT  --   --  0.4  --   --   TRIG 119  --   --   --   --    < > = values in this interval not displayed.    ASSESSMENT                                                                                       Recurrent Uterine Ca, liver mets, ascites, small bowel obstruction.  Significant events:  4/20: TF held after RN noted some TF during oropharyngeal suctioning  Today:   Glucose - range 117 to 151 on SSI q6h  Electrolytes - K borderline low; Phos elevated in setting of worsening renal insufficient and concomitant propofol (lipid emulsion vehicle contains phosphorus)    Renal - SCr continues to rise (2.38).   LFTs - below ULN (4/18)  TGs - 119 (4/17)  Prealbumin  < 5 (4/16)  Volume overload:  I/O net (-) 0.4L past 24 hrs.  Weight 128.7 kg (baseline weight 110 kg on 4/5).  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     NUTRITIONAL GOALS  RD recs (4/16): Kcal:  7510-2585; Protein:  >/= 136 g  Based on published guidelines, while the patient meets ICU status the initiation of lipids is being delayed for 7 days or until transition out of ICU.  Clinimix 5/15 @ 110 mL/hr to provide 132 grams protein (97% of goal) and 1874 kcal (121 % of goal)  Glucose infusion rate would be ~2.1 mg/kg/min (Maximum 5 mg/kg/min)   PLAN                                                                                                                         This AM:   KCl 54mEq IV q1h x 2  (removing electrolytes from TPN formula - see below)  At 1800 today:  Change to Clinimix 5/15 plain (non-electrolyte formula) due to hyperphosphatemia.  Continue rate at 83 mL/hr for now.  HOLD lipids for now as outlined above  Monitor fluid status closely.  TPN to contain standard multivitamins and trace elements.  Holding IVF per MD in setting of volume overload  Continue sensitive scale Novolog, CBGs q6hr  BMet, Mg, Phos tomorrow  TPN lab panels on Mondays & Thursdays.    Clayburn Pert, PharmD, BCPS [phone] 12/24/2017  9:49 AM

## 2017-12-24 NOTE — Progress Notes (Signed)
PMT progress note.  Patient seen briefly. Patient undergoing chest PT -  Respiratory therapy colleagues following. Eyes closed, evidence of generalized edema.  BP 133/62   Pulse 61   Temp 99 F (37.2 C)   Resp (!) 0   Ht 5' 4" (1.626 m)   Wt 128.7 kg (283 lb 11.7 oz)   LMP 04/20/2016   SpO2 97%   BMI 48.70 kg/m  Labs and imaging noted.  Discussed with bedside RN. Overnight changes noted. Tube feeds had to be stopped high concern for tube feeds not being absorbed.   Ongoing efforts regarding continuing with weaning protocol, decreasing sedation. At times, met with setbacks with high respiratory rate 1 sedation is lightened. Remains intubated and mechanically ventilated.  Call placed and discussed with sister Robyn Guerra, who is the patient's next of kin, primary decision maker. She is appreciative of the information she is receiving from nursing colleagues in the stepdown unit as well as pulmonary critical care medicine staff. Patient's sister states that she is aware of how the weaning protocols are going as well as is aware of the concern for tube feeds not being absorbed. Tried to initiate discussions regarding overall goals of care. Patient's sister states, " I guess Monday is day 7, decision-making day. We will talk then."  PMT to continue to follow.  15 minutes spent. Loistine Chance MD [place] medicine team [phone]

## 2017-12-24 NOTE — Progress Notes (Signed)
ANTICOAGULATION CONSULT NOTE - Follow Up Consult  Pharmacy Consult for Heparin Indication: pulmonary embolism  Allergies  Allergen Reactions  . Codeine Nausea And Vomiting    Room spins    Patient Measurements: Height: 5\' 4"  (162.6 cm) Weight: 283 lb 11.7 oz (128.7 kg) IBW/kg (Calculated) : 54.7 Heparin Dosing Weight: 86.3 kg   Vital Signs: Temp: 97.5 F (36.4 C) (04/20 0700) BP: 126/53 (04/20 0700) Pulse Rate: 60 (04/20 0700)  Labs: Recent Labs    12/22/17 0450  12/22/17 2123 12/23/17 0442 12/24/17 0445  HGB 7.6*  --   --  7.0* 7.1*  HCT 25.1*  --   --  22.6* 23.1*  PLT 438*  --   --  434* 416*  HEPARINUNFRC 0.27*   < > 0.37 0.37 0.51  CREATININE 1.87*  --   --  2.33* 2.38*   < > = values in this interval not displayed.    Estimated Creatinine Clearance: 33.5 mL/min (A) (by C-G formula based on SCr of 2.38 mg/dL (H)).   Medications:  Infusions:  . Marland KitchenTPN (CLINIMIX-E) Adult 83 mL/hr at 12/23/17 1817  . dexmedetomidine (PRECEDEX) IV infusion 0.8 mcg/kg/hr (12/24/17 7616)  . famotidine (PEPCID) IV Stopped (12/23/17 2207)  . fentaNYL infusion INTRAVENOUS 100 mcg/hr (12/24/17 0543)  . heparin 1,950 Units/hr (12/23/17 1750)  . propofol (DIPRIVAN) infusion 10 mcg/kg/min (12/24/17 0230)    Assessment: 11 YOF with stage IV uterine cancer.  She underwent ex lap with TAH, BSO, omentectomy and debulking on 3/21.  CT chest re-reviewed and determined to have RUL PE.   Pharmacy is consulted to dose heparin gtt.   Of note, patient was also in atrial fibrillation, which resolved with mechanical ventilation and sedation.   Today, 12/24/2017: Heparin level 0.51 is therapeutic on Heparin at 1950 units/hr CBC:  Hgb 7.1 (low but stable), Pltc remains elevated. No overt bleeding or infusion problems reported Renal: SCr 2.38, increasing.  Goal of Therapy:  Heparin level 0.3-0.7 units/ml Monitor platelets by anticoagulation protocol: Yes   Plan:  Continue heparin IV infusion at  1950 units/hr Daily heparin level and CBC Continue to monitor H&H and platelets  Clayburn Pert, PharmD, BCPS [phone] 12/24/2017  7:56 AM

## 2017-12-24 NOTE — Progress Notes (Signed)
PULMONARY / CRITICAL CARE MEDICINE   Name: Robyn Guerra MRN: [id number] DOB: 01-26-1956    ADMISSION DATE:  12/26/2017 CONSULTATION DATE:  12/20/2017  REFERRING MD:  Robyn Guerra - [facility].   CHIEF COMPLAINT:  Dyspnea.  BRIEF SUMMARY:   62 year old woman with stage IV uterine cancer s/p ex lap, TAH, BSO, omentectomy, radical tumor debulking on 11/24/17.  Went home with sister and was readmitted with postop AKI, and pain. Underwent paracentesis of malignant ascites with improvement in symptoms.    While planning for palliative chemotherapy, the patient developed AF related to PE, SBO and aspiration pneumonia. While initially improved, she coughed out NGT 4/15 and vomited and aspirated, intubated 4/16  STUDIES:    CULTURES: Peritoneal fluid 4/9 >> negative  BCx2 4/11 >> negative  Sputum 4/16 >> ng  ANTIBIOTICS: Unasyn 4/11 >> 4/18  SIGNIFICANT EVENTS: 4/05  Admit  4/16  PCCM consulted for respiratory failure   LINES/TUBES: RUE PICC 4/11 >>  ETT 4/16 >>   SUBJECTIVE:   Remains critically ill,  sedated, intubated UO improved with lasix , equal balance TFs stopped again     VITAL SIGNS: BP 133/62   Pulse 61   Temp 99 F (37.2 C)   Resp (!) 0   Ht 5\' 4"  (1.626 m)   Wt 283 lb 11.7 oz (128.7 kg)   LMP 04/20/2016   SpO2 97%   BMI 48.70 kg/m   HEMODYNAMICS:    VENTILATOR SETTINGS: Vent Mode: PRVC FiO2 (%):  [40 %] 40 % Set Rate:  [24 bmp] 24 bmp Vt Set:  [440 mL] 440 mL PEEP:  [5 cmH20] 5 cmH20 Plateau Pressure:  [17 cmH20-29 cmH20] 29 cmH20  INTAKE / OUTPUT: I/O last 3 completed shifts: In: 6184.8 [I.V.:5582.5; Other:210; NG/GT:242.3; IV Piggyback:150] Out: 3295 [Urine:5402; Emesis/NG output:100; Other:340]  PHYSICAL EXAMINATION: General: critically ill appearing female in NAD on vent  HEENT: MM pink/moist, ETT Neuro: Sedate on prop + precedex + fent , fent, RASS-4 CV: s1s2 rrr, no m/r/g PULM: even/non-labored, lungs bilaterally diminished   JO:ACZY, non-tender, bsx4 active  Extremities: warm/dry, 1-2+ generalized edema  Skin: no rashes or lesions  LABS:  BMET Recent Labs  Lab 12/22/17 0450 12/23/17 0442 12/24/17 0445  NA 142 142 143  K 3.6 3.6 3.5  CL 113* 110 109  CO2 21* 23 23  BUN 21* 27* 32*  CREATININE 1.87* 2.33* 2.38*  GLUCOSE 120* 123* 151*    Electrolytes Recent Labs  Lab 12/21/17 0535 12/22/17 0450 12/23/17 0442 12/24/17 0445  CALCIUM 8.2* 8.2* 8.1* 8.3*  MG 2.2 2.3  --  2.3  PHOS 3.7 4.2  --  5.6*    CBC Recent Labs  Lab 12/22/17 0450 12/23/17 0442 12/24/17 0445  WBC 16.5* 16.0* 16.0*  HGB 7.6* 7.0* 7.1*  HCT 25.1* 22.6* 23.1*  PLT 438* 434* 416*    Coag's No results for input(s): APTT, INR in the last 168 hours.  Sepsis Markers No results for input(s): LATICACIDVEN, PROCALCITON, O2SATVEN in the last 168 hours.  ABG Recent Labs  Lab 12/20/17 1135 12/20/17 1227 12/20/17 1645  PHART 7.264* 7.216* 7.287*  PCO2ART 45.4 53.5* 42.3  PO2ART 80.0* 87.8 126*    Liver Enzymes Recent Labs  Lab 12/20/17 0415 12/20/17 0912 12/22/17 0450  AST 17 14* 16  ALT 14 14 13*  ALKPHOS 72 81 85  BILITOT 0.4 0.5 0.4  ALBUMIN 2.0* 2.0* 1.7*    Cardiac Enzymes No results for input(s): TROPONINI, PROBNP in  the last 168 hours.  Glucose Recent Labs  Lab 12/23/17 2044 12/23/17 2330 12/24/17 0323 12/24/17 0525 12/24/17 0753 12/24/17 1143  GLUCAP 128* 143* 148* 135* 143* 144*    Imaging Dg Chest Port 1 View  Result Date: 12/24/2017 CLINICAL DATA:  Acute respiratory failure.  Ex-smoker. EXAM: PORTABLE CHEST 1 VIEW COMPARISON:  12/23/2017. FINDINGS: The endotracheal to tip is at the carina, directed toward the right mainstem bronchus. Nasogastric tube extending into the stomach. Right PICC tip in the inferior aspect of the superior vena cava. Stable enlarged cardiac silhouette. Decreased bibasilar atelectasis. The interstitial markings remain mildly prominent. Thoracic spine  degenerative changes. IMPRESSION: 1. Low position of the endotracheal tube with its tip at the carina, directed toward the right mainstem bronchus. It is recommended that this be retracted 4 cm. 2. Decreased bibasilar atelectasis. 3. Stable cardiomegaly and mild chronic interstitial lung disease. Electronically Signed   By: Claudie Revering M.D.   On: 12/24/2017 07:45      DISCUSSION: 62 y/o unfortunate female with advanced cancer s/p debulking surgery , intubated 4/16 for aspiration pneumonia Current concerns are dropping Hb, inability to tolerate TFs & agitation when sedation lowered  ASSESSMENT / PLAN:  PULMONARY A: Acute Hypoxic Respiratory Failure - due to aspiration pneumonia requiring intubation. P:   PRVC 8 cc/kg  Daily WUA/SBts - did not tolerate today Follow intermittent CXR PRN albuterol    CARDIOVASCULAR A:  Atrial fibrillation  P:  Tele  Hold heparin gtt per pharmacy , may have to hold if Hb drops further LCB - no CPR or ACLS  RENAL A:   AKI onStage III CKD - clinically volume overloaded since admission with edema, particularly of lower extremities.  Remains in + fluid balance. P:   Trend BMP / urinary output Replace electrolytes as indicated Avoid nephrotoxic agents, ensure adequate renal perfusion Rpt Lasix 80 for goal  equal balance & follow cr  GASTROINTESTINAL A:   SBO - conservatively managed with NG suctioning, does not tolerate Tfs , attempted x 2, with reglan Moderate Malnutrition   P:   NPO  Monitor LFT's while on TPN     HEMATOLOGIC A:   Anemia - suspect of chronic disease.  Thrombocytosis - likely reactive  Malignancy related and post operative PE.  P:  Dc IV heparin due to  Hb drop Trend CBC     INFECTIOUS A:   Leukocytosis secondary to aspiration pneumonia.    P:   Trend WBC / fever curve  Monitor off abx    ENDOCRINE A:   At Risk Hyperglycemia - on TPN P:   Monitor glucose   NEUROLOGIC A:   No delirium pre-intubation.  Comfortable on current sedation regimen P:   RASS goal: 0 to -1  Fentanyl gtt for pain control  Change from Propofol back to precedex to enable weaning Use versed prn      FAMILY  - Updates:The patient and her sister desired aggressive care , so we proceeded with intubation. per d/w patient's sister Robyn Guerra) -readdress [place] on monday While long-term prognosis is poor the hope would be to liberate her from vent  and provide her with some quality of life but this is also seeming unlikely  - CODE STATUS: Partial code (no CPR, ACLS).  See above discussion.   The patient is critically ill with multiple organ systems failure and requires high complexity decision making for assessment and support, frequent evaluation and titration of therapies, application of advanced monitoring technologies and extensive  interpretation of multiple databases. Critical Care Time devoted to patient care services described in this note independent of APP/resident  time is 33 minutes.   Kara Mead MD. [place] Flood. [facility] Pager [phone] If no response call [phone]     12/24/2017, 11:57 AM

## 2017-12-25 ENCOUNTER — Inpatient Hospital Stay (HOSPITAL_COMMUNITY): Payer: BLUE CROSS/BLUE SHIELD

## 2017-12-25 DIAGNOSIS — J9600 Acute respiratory failure, unspecified whether with hypoxia or hypercapnia: Secondary | ICD-10-CM

## 2017-12-25 DIAGNOSIS — C786 Secondary malignant neoplasm of retroperitoneum and peritoneum: Secondary | ICD-10-CM

## 2017-12-25 DIAGNOSIS — C801 Malignant (primary) neoplasm, unspecified: Secondary | ICD-10-CM

## 2017-12-25 LAB — BASIC METABOLIC PANEL
ANION GAP: 9 (ref 5–15)
BUN: 39 mg/dL — ABNORMAL HIGH (ref 6–20)
CALCIUM: 8.1 mg/dL — AB (ref 8.9–10.3)
CO2: 21 mmol/L — ABNORMAL LOW (ref 22–32)
Chloride: 109 mmol/L (ref 101–111)
Creatinine, Ser: 2.46 mg/dL — ABNORMAL HIGH (ref 0.44–1.00)
GFR calc Af Amer: 23 mL/min — ABNORMAL LOW (ref >60)
GFR, EST NON AFRICAN AMERICAN: 20 mL/min — AB (ref >60)
Glucose, Bld: 141 mg/dL — ABNORMAL HIGH (ref 65–99)
Potassium: 3.7 mmol/L (ref 3.5–5.1)
SODIUM: 139 mmol/L (ref 135–145)

## 2017-12-25 LAB — GLUCOSE, CAPILLARY
GLUCOSE-CAPILLARY: 157 mg/dL — AB (ref 65–99)
Glucose-Capillary: 147 mg/dL — ABNORMAL HIGH (ref 65–99)
Glucose-Capillary: 154 mg/dL — ABNORMAL HIGH (ref 65–99)
Glucose-Capillary: 145 mg/dL — ABNORMAL HIGH (ref 65–99)

## 2017-12-25 LAB — CBC
HCT: 23.9 % — ABNORMAL LOW (ref 36.0–46.0)
Hemoglobin: 7.1 g/dL — ABNORMAL LOW (ref 12.0–15.0)
MCH: 22 pg — AB (ref 26.0–34.0)
MCHC: 29.7 g/dL — ABNORMAL LOW (ref 30.0–36.0)
MCV: 74 fL — AB (ref 78.0–100.0)
Platelets: 400 10*3/uL (ref 150–400)
RBC: 3.23 MIL/uL — ABNORMAL LOW (ref 3.87–5.11)
RDW: 19.3 % — AB (ref 11.5–15.5)
WBC: 17.2 10*3/uL — AB (ref 4.0–10.5)

## 2017-12-25 LAB — HEPARIN LEVEL (UNFRACTIONATED)

## 2017-12-25 LAB — PHOSPHORUS: Phosphorus: 5.2 mg/dL — ABNORMAL HIGH (ref 2.5–4.6)

## 2017-12-25 LAB — MAGNESIUM: MAGNESIUM: 2.4 mg/dL (ref 1.7–2.4)

## 2017-12-25 MED ORDER — FUROSEMIDE 10 MG/ML IJ SOLN
80.0000 mg | Freq: Once | INTRAMUSCULAR | Status: AC
  Administered 2017-12-25: 80 mg via INTRAVENOUS
  Filled 2017-12-25: qty 8

## 2017-12-25 MED ORDER — TRACE MINERALS CR-CU-MN-SE-ZN 10-1000-500-60 MCG/ML IV SOLN
INTRAVENOUS | Status: AC
  Administered 2017-12-25: 17:00:00 via INTRAVENOUS
  Filled 2017-12-25 (×2): qty 2000

## 2017-12-25 MED ORDER — POTASSIUM CHLORIDE 10 MEQ/100ML IV SOLN
10.0000 meq | INTRAVENOUS | Status: AC
  Administered 2017-12-25 (×2): 10 meq via INTRAVENOUS
  Filled 2017-12-25 (×2): qty 100

## 2017-12-25 NOTE — Progress Notes (Signed)
Robyn Guerra is a 62 y.o. female patient. 1. Acute prerenal failure ([place])   2. Acute renal failure with tubular necrosis (HCC)   3. Other constipation   4. Malignant ascites   5. Pain of upper abdomen   6. Emesis   7. Uterine cancer ([place])   8. Abdominal pain   9. SBO (small bowel obstruction) ([place])   10. Small bowel obstruction ([place])   11. Hypoxia   12. Encounter for orogastric (OG) tube placement   13. Intubation of airway performed without difficulty   14. Encounter for imaging study to confirm orogastric (OG) tube placement   15. Respiratory failure ([place])   16. Acute respiratory failure with hypoxia (HCC)   17. H/O small bowel obstruction   18. Acute respiratory failure [facility])    Past Medical History:  Diagnosis Date  . Cancer [facility])    endometrial cancer  . Fibroids   . GERD (gastroesophageal reflux disease)   . Hypertension   . Sinusitis    Current Facility-Administered Medications  Medication Dose Route Frequency Provider Last Rate Last Dose  . albuterol (PROVENTIL) (2.5 MG/3ML) 0.083% nebulizer solution 2.5 mg  2.5 mg Nebulization Q4H PRN Agarwala, Einar Grad, MD      . chlorhexidine gluconate (MEDLINE KIT) (PERIDEX) 0.12 % solution 15 mL  15 mL Mouth Rinse BID Kipp Brood, MD   15 mL at 12/25/17 2028  . Chlorhexidine Gluconate Cloth 2 % PADS 6 each  6 each Topical Daily Dessa Phi, DO   6 each at 12/25/17 0931  . dexmedetomidine (PRECEDEX) 400 MCG/100ML (4 mcg/mL) infusion  0.4-1.8 mcg/kg/hr Intravenous Titrated Rigoberto Noel, MD 56.9 mL/hr at 12/25/17 1729 1.8 mcg/kg/hr at 12/25/17 1729  . fentaNYL (SUBLIMAZE) bolus via infusion 50 mcg  50 mcg Intravenous Q1H PRN Kipp Brood, MD   50 mcg at 12/25/17 1558  . fentaNYL 2558mg in NS 2577m(1060mml) infusion-PREMIX  25-400 mcg/hr Intravenous Continuous ChoDessa PhiO 40 mL/hr at 12/25/17 1310 400 mcg/hr at 12/25/17 1310  . guaiFENesin-dextromethorphan (ROBITUSSIN DM) 100-10 MG/5ML syrup 5 mL  5 mL Oral Q4H PRN  KimJani GravelD   5 mL at 12/19/17 2346  . hydrALAZINE (APRESOLINE) injection 5 mg  5 mg Intravenous Q4H PRN ChoDessa PhiO      . insulin aspart (novoLOG) injection 0-9 Units  0-9 Units Subcutaneous Q6H Green, Terri L, RPH   1 Units at 12/25/17 1750  . lidocaine (XYLOCAINE) 1 % (with pres) injection    PRN Allred, Darrell K, PA-C   10 mL at 12/15/17 1141  . MEDLINE mouth rinse  15 mL Mouth Rinse 10 times per day AgaKipp BroodD   15 mL at 12/25/17 2109  . midazolam (VERSED) injection 1-2 mg  1-2 mg Intravenous Q2H PRN AlvRigoberto NoelD   2 mg at 12/25/17 2043  . phenol (CHLORASEPTIC) mouth spray 1 spray  1 spray Mouth/Throat PRN ChoDessa PhiO   1 spray at 12/19/17 2346  . propofol (DIPRIVAN) 1000 MG/100ML infusion  5-80 mcg/kg/min Intravenous Titrated OgaFrederik PearD   Stopped at 12/24/17 1137  . sodium chloride flush (NS) 0.9 % injection 10-40 mL  10-40 mL Intracatheter Q12H ChoDessa PhiO   10 mL at 12/25/17 2110  . sodium chloride flush (NS) 0.9 % injection 10-40 mL  10-40 mL Intracatheter PRN ChoDessa PhiO   10 mL at 12/22/17 1730  . TPN (CLINIMIX) Adult without lytes   Intravenous Continuous [facility]  83 mL/hr at 12/25/17 1714     Allergies  Allergen Reactions  . Codeine Nausea And Vomiting    Room spins   Principal Problem:   Acute prerenal failure (HCC) Active Problems:   Endometrial cancer (HCC)   Morbid obesity with BMI of 45.0-49.9, adult (HCC)   Secondary malignant neoplasm of omentum (HCC)   Peritoneal carcinomatosis (HCC)   Iron deficiency anemia due to chronic blood loss   Wound dehiscence, surgical, initial encounter   Abdominal pain   Other constipation   Renal failure, acute (HCC)   Renal failure   Malnutrition of moderate degree  Blood pressure (!) 170/80, pulse 70, temperature 99.1 F (37.3 C), resp. rate (!) 27, height _0  (1.626 m), weight 281 lb 8.4 oz (127.7 kg), last menstrual period 04/20/2016, SpO2 96  %.  Subjective Objective: General Appearance:  In no acute distress.   Vital signs: (most recent): Blood pressure (!) 170/80, pulse 70, temperature 99.1 F (37.3 C), resp. rate (!) 27, height _1  (1.626 m), weight 281 lb 8.4 oz (127.7 kg), last menstrual period 04/20/2016, SpO2 96 %.    Assessment & Plan Robyn Guerra  is a 62 y.o.  year old with stage IV carcinosarcoma of the uterus s/p ex lap, TAH, BSO, omentectomy, radical tumor debulking on 11/24/17 readmitted with a prolonged postoperative ileus, PE, Afib, AKI and aspiration PNA. She is now felt to be in respiratory failure. Critical care notes appreciated.  Will continue to follow with you.   Robyn Guerra

## 2017-12-25 NOTE — Progress Notes (Signed)
PHARMACY - ADULT TOTAL PARENTERAL NUTRITION CONSULT NOTE   Pharmacy Consult for TPN Indication: SBO, inability to tolerate TF  Patient Measurements: Height: 5\' 4"  (162.6 cm) Weight: 281 lb 8.4 oz (127.7 kg) IBW/kg (Calculated) : 54.7 TPN AdjBW (KG): 68.6 Body mass index is 48.32 kg/m. Usual Weight: 30 lb weight loss per Dietitian notes 4/8  Insulin Requirements: 4 units SSI Novolog in past 24 hrs   Current Nutrition: TPN  Off propofol  IVF: D5-NaBicarb d/c.  Remains on heparin, pain/sedation continuous infusions.  Central access: PICC 4/11 TPN start date: 4/15  Recent Labs    12/24/17 0445 12/25/17 0400  NA 143 139  K 3.5 3.7  CL 109 109  CO2 23 21*  GLUCOSE 151* 141*  BUN 32* 39*  CREATININE 2.38* 2.46*  CALCIUM 8.3* 8.1*  PHOS 5.6* 5.2*  MG 2.3 2.4    ASSESSMENT                                                                                       Recurrent Uterine Ca, liver mets, ascites, small bowel obstruction.  Significant events:  4/20: TF held after RN noted some TF during oropharyngeal suctioning. Remove electrolytes from TPN formula due to hyperphosphatemia.  Today:   Glucose - range 138 to 147 on SSI q6h  Electrolytes - K borderline low; Phos elevated in setting of worsening renal insufficiency    Renal - SCr continues to rise (2.46).   LFTs - below ULN (4/18)  TGs - 119 (4/17)  Prealbumin  < 5 (4/16)  Volume overload:  I/O net (+) 232cc past 24 hrs.  Weight 127.7 kg (baseline weight 110 kg on 4/5).  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     NUTRITIONAL GOALS                                                                                             RD recs (4/16): Kcal:  3810-1751; Protein:  >/= 136 g  Based on published guidelines, while the patient meets ICU status the initiation of lipids is being delayed for 7 days or until transition out of ICU. Lipid start date - 4/22  Clinimix 5/15 @ 110 mL/hr to provide 132  grams protein (97% of goal) and 1874 kcal (121 % of goal)  Glucose infusion rate would be ~2.1 mg/kg/min (Maximum 5 mg/kg/min)   PLAN  This AM:   KCl 7mEq IV q1h x 2  (since no electrolytes in TPN formula - see below)  At 1800 today:  Continue Clinimix 5/15 plain (non-electrolyte formula).  Continue rate at 83 mL/hr for now due to fluid overload.  HOLD lipids as outlined above  Monitor fluid status closely.  TPN to contain standard multivitamins and trace elements.  Holding IVF per MD in setting of volume overload  Currently receiving pepcid 20mg  IV q12h boluses - will d/c and add 40mg  pepcid to TPN bag  Continue sensitive scale Novolog, CBGs q6hr  TPN lab panels on Mondays & Thursdays.    Peggyann Juba, PharmD, BCPS Pager: [phone] 12/25/2017  7:31 AM

## 2017-12-25 NOTE — Progress Notes (Signed)
PULMONARY / CRITICAL CARE MEDICINE   Name: Robyn Guerra MRN: [id number] DOB: March 03, 1956    ADMISSION DATE:  12/22/2017 CONSULTATION DATE:  12/20/2017  REFERRING MD:  Maylene Roes - [facility].   CHIEF COMPLAINT:  Dyspnea.  BRIEF SUMMARY:   62 year old woman with stage IV uterine cancer s/p ex lap, TAH, BSO, omentectomy, radical tumor debulking on 11/24/17.  Went home with sister and was readmitted with postop AKI, and pain. Underwent paracentesis of malignant ascites with improvement in symptoms.    While planning for palliative chemotherapy, the patient developed AF related to PE, SBO and aspiration pneumonia. While initially improved, she coughed out NGT 4/15 and vomited and aspirated, intubated 4/16  STUDIES:    CULTURES: Peritoneal fluid 4/9 >> negative  BCx2 4/11 >> negative  Sputum 4/16 >> ng  ANTIBIOTICS: Unasyn 4/11 >> 4/18  SIGNIFICANT EVENTS: 4/05  Admit  4/16  PCCM consulted for respiratory failure  4/20 TFs stopped again , appeared in suction line  LINES/TUBES: RUE PICC 4/11 >>  ETT 4/16 >>   SUBJECTIVE:   Remains critically ill,  sedated, intubated On precedex + high dose fent gtt    VITAL SIGNS: BP 123/69   Pulse 69   Temp 99.1 F (37.3 C)   Resp 15   Ht 5\' 4"  (1.626 m)   Wt 281 lb 8.4 oz (127.7 kg)   LMP 04/20/2016   SpO2 97%   BMI 48.32 kg/m   HEMODYNAMICS:    VENTILATOR SETTINGS: Vent Mode: PRVC FiO2 (%):  [40 %-60 %] 40 % Set Rate:  [24 bmp] 24 bmp Vt Set:  [40 mL-440 mL] 440 mL PEEP:  [5 cmH20] 5 cmH20 Pressure Support:  [15 cmH20] 15 cmH20 Plateau Pressure:  [18 cmH20-23 cmH20] 22 cmH20  INTAKE / OUTPUT: I/O last 3 completed shifts: In: 5306.1 [I.V.:4998.5; NG/GT:207.7; IV Piggyback:100] Out: 5643 [Urine:3525; Emesis/NG output:325; Other:220]  PHYSICAL EXAMINATION: General: critically ill appearing female in NAD on vent  HEENT: MM pink/moist, ETT Neuro: Sedate on  precedex + fent ,  RASS-4 CV: s1s2 rrr, no m/r/g PULM:  even/non-labored, lungs bilaterally diminished  PI:RJJO, non-tender, bsx4 active  Extremities: warm/dry, 1-2+ generalized edema  Skin: no rashes or lesions  LABS:  BMET Recent Labs  Lab 12/23/17 0442 12/24/17 0445 12/25/17 0400  NA 142 143 139  K 3.6 3.5 3.7  CL 110 109 109  CO2 23 23 21*  BUN 27* 32* 39*  CREATININE 2.33* 2.38* 2.46*  GLUCOSE 123* 151* 141*    Electrolytes Recent Labs  Lab 12/22/17 0450 12/23/17 0442 12/24/17 0445 12/25/17 0400  CALCIUM 8.2* 8.1* 8.3* 8.1*  MG 2.3  --  2.3 2.4  PHOS 4.2  --  5.6* 5.2*    CBC Recent Labs  Lab 12/23/17 0442 12/24/17 0445 12/25/17 0400  WBC 16.0* 16.0* 17.2*  HGB 7.0* 7.1* 7.1*  HCT 22.6* 23.1* 23.9*  PLT 434* 416* 400    Coag's No results for input(s): APTT, INR in the last 168 hours.  Sepsis Markers No results for input(s): LATICACIDVEN, PROCALCITON, O2SATVEN in the last 168 hours.  ABG Recent Labs  Lab 12/20/17 1135 12/20/17 1227 12/20/17 1645  PHART 7.264* 7.216* 7.287*  PCO2ART 45.4 53.5* 42.3  PO2ART 80.0* 87.8 126*    Liver Enzymes Recent Labs  Lab 12/20/17 0415 12/20/17 0912 12/22/17 0450  AST 17 14* 16  ALT 14 14 13*  ALKPHOS 72 81 85  BILITOT 0.4 0.5 0.4  ALBUMIN 2.0* 2.0* 1.7*  Cardiac Enzymes No results for input(s): TROPONINI, PROBNP in the last 168 hours.  Glucose Recent Labs  Lab 12/24/17 0753 12/24/17 1143 12/24/17 1541 12/24/17 1728 12/24/17 2345 12/25/17 0527  GLUCAP 143* 144* 138* 124* 138* 147*    Imaging Dg Chest Port 1 View  Result Date: 12/25/2017 CLINICAL DATA:  Acute respiratory failure.  Recent laparotomy. EXAM: PORTABLE CHEST 1 VIEW COMPARISON:  One day prior FINDINGS: Endotracheal tube terminates 3.0 cm above carina.Nasogastric tube extends beyond the inferior aspect of the film. Right-sided PICC line terminates at the superior caval/atrial junction. Cardiomegaly accentuated by AP portable technique. No pleural effusion or pneumothorax. No  congestive failure. Low lung volumes with resultant pulmonary interstitial prominence. Similar right greater than left base atelectasis. IMPRESSION: Cardiomegaly and low lung volumes with mild bibasilar atelectasis. Reposition of endotracheal tube, now appropriate. Electronically Signed   By: Abigail Miyamoto M.D.   On: 12/25/2017 07:29      DISCUSSION: 62 y/o unfortunate female with advanced cancer s/p debulking surgery , intubated 4/16 for aspiration pneumonia Current concerns are dropping Hb, inability to tolerate TFs & agitation when sedation lowered , unable to wean due to above  ASSESSMENT / PLAN:  PULMONARY A: Acute Hypoxic Respiratory Failure - due to aspiration pneumonia requiring intubation. P:   PRVC 8 cc/kg  Daily WUA/SBts Follow intermittent CXR PRN albuterol    CARDIOVASCULAR A:  Atrial fibrillation  P:  Tele  Hold heparin gtt for Hb drop LCB - no CPR or ACLS  RENAL A:   AKI onStage III CKD - clinically volume overloaded since admission with edema, particularly of lower extremities.  Remains in + fluid balance. P:   Trend BMP / urinary output Replace electrolytes as indicated Avoid nephrotoxic agents, ensure adequate renal perfusion Daily  Lasix 80 for goal  equal balance & follow cr  GASTROINTESTINAL A:   SBO - conservatively managed with NG suctioning, does not tolerate Tfs , attempted x 2, with reglan Moderate Malnutrition   P:   NPO  Monitor LFT's while on TPN     HEMATOLOGIC A:   Anemia - suspect of chronic disease.  Thrombocytosis - likely reactive  Malignancy related and post operative PE.  P:  Dc IV heparin due to  Hb drop Trend CBC     INFECTIOUS A:   Leukocytosis secondary to aspiration pneumonia.    P:   Trend WBC / fever curve  Monitor off abx    ENDOCRINE A:   At Risk Hyperglycemia - on TPN P:   Monitor glucose   NEUROLOGIC A:   No delirium pre-intubation. Comfortable on current sedation regimen P:   RASS goal: 0 to -1   Fentanyl gtt for pain control  Ct precedex to enable weaning Use versed prn   FAMILY  - Updates:The patient and her sister desired aggressive care , so intubated.  per d/w patient's sister [name]) -readdress GOC on monday Long-term prognosis is poor, the hope is to liberate her from vent  and provide her with some quality of life but this is  seeming unlikely  - CODE STATUS: Partial code (no CPR, ACLS).  See above discussion.   The patient is critically ill with multiple organ systems failure and requires high complexity decision making for assessment and support, frequent evaluation and titration of therapies, application of advanced monitoring technologies and extensive interpretation of multiple databases. Critical Care Time devoted to patient care services described in this note independent of APP/resident  time is 31 minutes.  Kara Mead MD. [place]ade Flood.  Pulmonary & Critical care Pager [phone] If no response call [phone]     12/25/2017, 11:41 AM

## 2017-12-26 ENCOUNTER — Inpatient Hospital Stay (HOSPITAL_COMMUNITY): Payer: BLUE CROSS/BLUE SHIELD

## 2017-12-26 DIAGNOSIS — J810 Acute pulmonary edema: Secondary | ICD-10-CM

## 2017-12-26 DIAGNOSIS — Z515 Encounter for palliative care: Secondary | ICD-10-CM

## 2017-12-26 LAB — PHOSPHORUS: Phosphorus: 4.8 mg/dL — ABNORMAL HIGH (ref 2.5–4.6)

## 2017-12-26 LAB — DIFFERENTIAL
BASOS PCT: 0 %
Basophils Absolute: 0 10*3/uL (ref 0.0–0.1)
Eosinophils Absolute: 0.3 10*3/uL (ref 0.0–0.7)
Eosinophils Relative: 2 %
Lymphocytes Relative: 8 %
Lymphs Abs: 1.3 10*3/uL (ref 0.7–4.0)
MONOS PCT: 8 %
Monocytes Absolute: 1.3 10*3/uL — ABNORMAL HIGH (ref 0.1–1.0)
NEUTROS ABS: 13.2 10*3/uL — AB (ref 1.7–7.7)
Neutrophils Relative %: 82 %

## 2017-12-26 LAB — CBC
HEMATOCRIT: 24 % — AB (ref 36.0–46.0)
Hemoglobin: 7.1 g/dL — ABNORMAL LOW (ref 12.0–15.0)
MCH: 22 pg — AB (ref 26.0–34.0)
MCHC: 29.6 g/dL — AB (ref 30.0–36.0)
MCV: 74.5 fL — AB (ref 78.0–100.0)
Platelets: 349 10*3/uL (ref 150–400)
RBC: 3.22 MIL/uL — ABNORMAL LOW (ref 3.87–5.11)
RDW: 19.2 % — ABNORMAL HIGH (ref 11.5–15.5)
WBC: 16.1 10*3/uL — ABNORMAL HIGH (ref 4.0–10.5)

## 2017-12-26 LAB — COMPREHENSIVE METABOLIC PANEL
ALT: 10 U/L — AB (ref 14–54)
AST: 17 U/L (ref 15–41)
Albumin: 1.6 g/dL — ABNORMAL LOW (ref 3.5–5.0)
Alkaline Phosphatase: 113 U/L (ref 38–126)
Anion gap: 10 (ref 5–15)
BUN: 45 mg/dL — AB (ref 6–20)
CHLORIDE: 108 mmol/L (ref 101–111)
CO2: 20 mmol/L — AB (ref 22–32)
CREATININE: 2.37 mg/dL — AB (ref 0.44–1.00)
Calcium: 8.2 mg/dL — ABNORMAL LOW (ref 8.9–10.3)
GFR calc Af Amer: 24 mL/min — ABNORMAL LOW (ref >60)
GFR, EST NON AFRICAN AMERICAN: 21 mL/min — AB (ref >60)
GLUCOSE: 165 mg/dL — AB (ref 65–99)
Potassium: 3.6 mmol/L (ref 3.5–5.1)
Sodium: 138 mmol/L (ref 135–145)
Total Bilirubin: 0.3 mg/dL (ref 0.3–1.2)
Total Protein: 7.5 g/dL (ref 6.5–8.1)

## 2017-12-26 LAB — MAGNESIUM: MAGNESIUM: 2.1 mg/dL (ref 1.7–2.4)

## 2017-12-26 LAB — GLUCOSE, CAPILLARY
GLUCOSE-CAPILLARY: 142 mg/dL — AB (ref 65–99)
GLUCOSE-CAPILLARY: 153 mg/dL — AB (ref 65–99)
Glucose-Capillary: 151 mg/dL — ABNORMAL HIGH (ref 65–99)

## 2017-12-26 LAB — TRIGLYCERIDES: Triglycerides: 131 mg/dL (ref <150)

## 2017-12-26 LAB — PREALBUMIN: Prealbumin: <5 mg/dL — ABNORMAL LOW (ref 18–38)

## 2017-12-26 MED ORDER — METOLAZONE 5 MG PO TABS
10.0000 mg | ORAL_TABLET | Freq: Once | ORAL | Status: AC
  Administered 2017-12-26: 10 mg
  Filled 2017-12-26: qty 2

## 2017-12-26 MED ORDER — TRACE MINERALS CR-CU-MN-SE-ZN 10-1000-500-60 MCG/ML IV SOLN
INTRAVENOUS | Status: AC
  Administered 2017-12-26: 17:00:00 via INTRAVENOUS
  Filled 2017-12-26 (×3): qty 2000

## 2017-12-26 MED ORDER — POTASSIUM CHLORIDE 20 MEQ/15ML (10%) PO SOLN
40.0000 meq | Freq: Three times a day (TID) | ORAL | Status: DC
  Administered 2017-12-26: 40 meq
  Filled 2017-12-26: qty 30

## 2017-12-26 MED ORDER — FUROSEMIDE 10 MG/ML IJ SOLN
10.0000 mg/h | INTRAVENOUS | Status: AC
  Administered 2017-12-26: 10 mg/h via INTRAVENOUS
  Filled 2017-12-26: qty 20

## 2017-12-26 MED ORDER — POTASSIUM CHLORIDE 10 MEQ/50ML IV SOLN
10.0000 meq | INTRAVENOUS | Status: AC
  Administered 2017-12-26 (×4): 10 meq via INTRAVENOUS
  Filled 2017-12-26 (×4): qty 50

## 2017-12-26 NOTE — Progress Notes (Signed)
Nutrition Follow-up  DOCUMENTATION CODES:   Morbid obesity, Non-severe (moderate) malnutrition in context of acute illness/injury  INTERVENTION:  - Continue TPN per Pharmacy. - Recommend goal rate for TPN: Clinimix 5/15 @105  mL/hr.  - RD will continue to monitor for needs, will monitor for future plans to re-try TF.   NUTRITION DIAGNOSIS:   Inadequate oral intake related to inability to eat as evidenced by NPO status. -ongoing  GOAL:   Provide needs based on ASPEN/SCCM guidelines -unmet with TPN regimen  MONITOR:   Vent status, Weight trends, Labs, Skin, Other (Comment)(TPN regimen)  ASSESSMENT:   62 y.o.  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 She also has acute blood loss anemia.  Weight continues trending up and is now +42 lbs/19 kg compared to admission weight. Flow sheet indicates pt with moderate to severe edema throughout. She remains intubated with OGT in place. OGT to LIS with ~50 mL output in canister at this time. Per RN note on 4/20 at 7:30 AM, 50 mL TF-colored secretions suctioned from pt that Am and TF placed on hold on 4/20 at ~6:00 AM. TF remains off since that time.   Palliative Care following. RD will continue to monitor for notes and for POC/GOC. Pt has a double lumen PICC in R brachial and is receiving Clinimix 5/15 @ 83 mL/hr. This regimen is providing 100 grams of protein (73% estimated protein need) and 1414 kcal.  Per Pharmacy note yesterday AM, goal for TPN is Clinimix 5/15 @ 110 mL/hr which will provide 132 grams of protein (97% estimated protein need) and 1874 kcal (121% estimated kcal need).   Recommend decreasing goal TPN rate to Clinimix 5/15 @ 105 mL/hr. This regimen will provide 126 grams of protein (93% estimated protein need) and 1789 kcal (115% estimated kcal need).   Per Dr. Bari Mantis note yesterday AM: TF stopped  again (second attempt for trickle TF) on 4/20 d/t TF appearing in suction line, daily WUA/SBTs, AKI on stage 3 CKD, volume overload since admission, pt unable to tolerate trickle TF even with scheduled Reglan order, plan to readdress GOC today (Monday), "long-term prognosis is poor, the hope is to liberate her from vent and provide her with some quality of life, but this is seeming unlikely."  Patient is currently intubated on ventilator support MV: 11.3 L/min Temp (24hrs), Avg:99.2 F (37.3 C), Min:99 F (37.2 C), Max:99.5 F (37.5 C) Propofol: none BP: 134/49 and MAP: 78  Medications reviewed; 80 mg IV Lasix x1 yesterday, sliding scale Novolog. Labs reviewed; CBGs: 151 mg/dL, BUN: 45 mL/min, creatinine: 2.37 mg/dL, Ca: 8.2 mg/dL, Phos: 4.8 mg/dL, GFR: 24 mL/min, triglycerides: 131 mg/dL this AM.   Drips: Precedex @ 1.7 mcg/kg/hr.     Diet Order:  Diet NPO time specified TPN (CLINIMIX) Adult without lytes  EDUCATION NEEDS:   No education needs have been identified at this time  Skin:  Skin Assessment: Reviewed RN Assessment  Last BM:  4/16  Height:   Ht Readings from Last 1 Encounters:  12/20/17 5\' 4"  (1.626 m)    Weight:   Wt Readings from Last 1 Encounters:  12/26/17 284 lb 13.4 oz (129.2 kg)    Ideal Body Weight:  54.5 kg  BMI:  Body mass index is 48.89 kg/m.  Estimated Nutritional Needs:   Kcal:  7829-5621 (11-14 kcal/kg actual body weight)  Protein:  >/= 136  grams (2.5 grams/kg IBW)  Fluid:  1.7L/day      Jarome Matin, MS, RD, LDN, CNSC Inpatient Clinical Dietitian Pager # [phone] After hours/weekend pager # [phone]

## 2017-12-26 NOTE — Progress Notes (Signed)
Daily Progress Note   Patient Name: Robyn Guerra       Date: 12/26/2017 DOB: 1956-05-27  Age: 62 y.o. MRN#: [id number] Attending Physician: Kipp Brood, MD Primary Care Physician: Lin Landsman, MD Admit Date: 12/23/2017  Reason for Consultation/Follow-up: Establishing goals of care  Subjective: Patient remains intubated and mechanically ventilated.  Hospital course complicated by drop in her hemoglobin, inability to tolerate tube feeds, agitation and sedation is decreased, inability to being weaned from the ventilator.  Sister present at the bedside, See discussions below.  Length of Stay: 14  Current Medications: Scheduled Meds:  . chlorhexidine gluconate (MEDLINE KIT)  15 mL Mouth Rinse BID  . Chlorhexidine Gluconate Cloth  6 each Topical Daily  . insulin aspart  0-9 Units Subcutaneous Q6H  . mouth rinse  15 mL Mouth Rinse 10 times per day  . potassium chloride  40 mEq Per Tube TID  . sodium chloride flush  10-40 mL Intracatheter Q12H    Continuous Infusions: . dexmedetomidine (PRECEDEX) IV infusion 1.7 mcg/kg/hr (12/26/17 1323)  . fentaNYL infusion INTRAVENOUS 400 mcg/hr (12/26/17 1058)  . furosemide (LASIX) infusion 10 mg/hr (12/26/17 1148)  . propofol (DIPRIVAN) infusion Stopped (12/24/17 1137)  . TPN (CLINIMIX) Adult without lytes 83 mL/hr at 12/25/17 1714  . TPN (CLINIMIX) Adult without lytes      PRN Meds: albuterol, fentaNYL, guaiFENesin-dextromethorphan, hydrALAZINE, lidocaine, midazolam, phenol, sodium chloride flush  Physical Exam         Critically ill appearing lady is on the vent Diminished breath sounds Has generalizededema Does not open eyes does not follow commands. Has OG tube Is on Lasix drip as well as Precedex and fentanyl Regular heart  sounds Abdomen does not appear distended  Vital Signs: BP (!) 132/57   Pulse 77   Temp 99.7 F (37.6 C)   Resp (!) 31   Ht '5\' 4"'$  (1.626 m)   Wt 135 kg (297 lb 9.9 oz)   LMP 04/20/2016   SpO2 92%   BMI 51.09 kg/m  SpO2: SpO2: 92 % O2 Device: O2 Device: Ventilator O2 Flow Rate: O2 Flow Rate (L/min): 10 L/min  Intake/output summary:   Intake/Output Summary (Last 24 hours) at 12/26/2017 1403 Last data filed at 12/26/2017 1200 Gross per 24 hour  Intake 4040.21 ml  Output 2740 ml  Net 1300.21  ml   LBM: Last BM Date: 12/20/17 Baseline Weight: Weight: 110.2 kg (242 lb 15.2 oz) Most recent weight: Weight: 135 kg (297 lb 9.9 oz) Palliative performance scale 10%       Palliative Assessment/Data:    Flowsheet Rows     Most Recent Value  Intake Tab  Referral Department  Hospitalist  Unit at Time of Referral  ICU  Palliative Care Primary Diagnosis  Cancer  Date Notified  12/15/17  Palliative Care Type  New Palliative care  Reason for referral  Clarify Goals of Care, Pain  Date of Admission  12/05/2017  Date first seen by Palliative Care  12/16/17  # of days Palliative referral response time  1 Day(s)  # of days IP prior to Palliative referral  6  Clinical Assessment  Palliative Performance Scale Score  50%  Pain Max last 24 hours  8  Pain Min Last 24 hours  3  Psychosocial & Spiritual Assessment  Palliative Care Outcomes  Patient/Family meeting held?  Yes  Palliative Care Outcomes  Clarified goals of care      Patient Active Problem List   Diagnosis Date Noted  . Malnutrition of moderate degree 12/18/2017  . Renal failure 12/12/2017  . Iron deficiency anemia due to chronic blood loss 12/05/2017  . Wound dehiscence, surgical, initial encounter 12/27/2017  . Acute prerenal failure ([place]) 12/10/2017  . Abdominal pain 12/07/2017  . Other constipation 12/10/2017  . Family history of cancer 12/20/2017  . Renal failure, acute ([place]) 12/26/2017  . 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 Peritoneal carcinomatosis ([place])   . Endometrial cancer ([place]) 11/01/2017  . Morbid obesity with BMI of 45.0-49.9, adult ([place]) 11/01/2017    Palliative Care Assessment & Plan   Patient Profile:   Assessment: Advanced stage IV uterine cancer status post exploratory laparotomy omentectomy radical tumor debulking 3-20 1-19. Aspiration pneumonia Ventilator dependent respiratory failure Acute hypoxic respiratory failure Atrial fibrillation Hospital course complicated by drop in her hemoglobin, inability to tolerate tube feeds, agitation and sedation is decreased, inability to being weaned from the ventilator.   Discussions/Recommendations/Plan: Family meeting with patient's sister who is her next of kin and primary decision maker. She is appreciative of the information she has received from [facility] CM. Reconfirmed with her about do not re intubate, no trach no PEG. She is aware of the current medical plan being to attempt aggressive diuresis with Lasix,proceed with one-way extubation later this week. There is a high possibility that the patient will require transition to full comfort measures based on how she does once she is extubated. Sister is tearful but fully understands that the patient is seriously ill and has a high risk for not getting better. Patient's sister does not want the OG tube used for any medications she is concerned that the patient is not absorbing her tube feeds. She wants to avoid further aspiration episodes. Discussed with bedside RN. Sister is asking for IV potassium runs in the future instead of oral potassium supplementation through the tube. Offered active listening and supportive care. All of her concerns addressed to the best of my ability.   Code Status:    Code Status Orders  (From admission, onward)        Start     Ordered   12/21/17 1350  Limited resuscitation (code)  Continuous    Question Answer Comment  In the event of cardiac  or respiratory ARREST: Initiate Code Blue, Call Rapid Response No  In the event of cardiac or respiratory ARREST: Perform CPR No   In the event of cardiac or respiratory ARREST: Perform Intubation/Mechanical Ventilation Yes   In the event of cardiac or respiratory ARREST: Use NIPPV/BiPAp only if indicated Yes   In the event of cardiac or respiratory ARREST: Administer ACLS medications if indicated No   In the event of cardiac or respiratory ARREST: Perform Defibrillation or Cardioversion if indicated No      12/21/17 1354    Code Status History    Date Active Date Inactive Code Status Order ID Comments User Context   12/05/2017 1648 12/21/2017 1354 Full Code [id number]  Dorothyann Gibbs, NP Inpatient   11/24/2017 1904 11/27/2017 2046 Full Code [id number]  Isabel Caprice, MD Inpatient       Prognosis:   guarded  Discharge Planning:  Anticipated hospital death versus transfer to residential hospice depending on patient's hospital course and disease trajectory over the course of the next 24-48 hours.   Palliative medicine team will continue to follow.  Care plan was discussed with patient's sister, next of kin, decision maker   Thank you for allowing the Palliative Medicine Team to assist in the care of this patient.   Time In: 1300 Time Out: 1335 Total Time 35 Prolonged Time Billed  no       Greater than 50%  of this time was spent counseling and coordinating care related to the above assessment and plan.  Loistine Chance, MD [phone] Please contact Palliative Medicine Team phone at [phone] for questions and concerns.

## 2017-12-26 NOTE — Progress Notes (Addendum)
PHARMACY - ADULT TOTAL PARENTERAL NUTRITION CONSULT NOTE   Pharmacy Consult for TPN Indication: SBO, inability to tolerate TF  Patient Measurements: Height: 5\' 4"  (162.6 cm) Weight: 284 lb 13.4 oz (129.2 kg) IBW/kg (Calculated) : 54.7 TPN AdjBW (KG): 68.6 Body mass index is 48.89 kg/m. Usual Weight: 30 lb weight loss per Dietitian notes 4/8  Insulin Requirements:  7 units SSI Novolog in past 24 hrs  Current Nutrition: TPN  Off propofol  IVF: D5-NaBicarb d/c.  Remains on heparin, pain/sedation continuous infusions.  Central access: PICC 4/11 TPN start date: 4/15  Recent Labs    12/25/17 0400 12/26/17 0530  NA 139 138  K 3.7 3.6  CL 109 108  CO2 21* 20*  GLUCOSE 141* 165*  BUN 39* 45*  CREATININE 2.46* 2.37*  CALCIUM 8.1* 8.2*  PHOS 5.2* 4.8*  MG 2.4 2.1  ALBUMIN  --  1.6*  ALKPHOS  --  113  AST  --  17  ALT  --  10*  BILITOT  --  0.3  TRIG  --  131  PREALBUMIN  --  <5*    ASSESSMENT                                                                                       Recurrent Uterine Ca, liver mets, ascites, small bowel obstruction.  Significant events:  4/20: TF held after RN noted some TF during oropharyngeal suctioning. Remove electrolytes from TPN formula due to hyperphosphatemia. 4/22: Lytes remaining wnl with no-electrolyte TPN formula. Can add Lipids today  Today:   Glucose - range 145 to 154 on SSI q6h  Electrolytes - K borderline low; Phos elevated in setting of worsening renal insufficiency, but level decreased this am    Renal - SCr continues elevated (2.37).   LFTs - below ULN (4/18)  TGs - 119 (4/17), 131 (4/22)  Prealbumin  < 5 (4/16), <5 (4/22)  Volume overload:  I/O net (+) daily .  Weight 129 kg (baseline weight 110 kg on 4/5).  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    NUTRITIONAL GOALS                                                                                             RD recs (4/16): Kcal:  7628-3151;  Protein:  >/= 136 g  Based on published guidelines, while the patient meets ICU status the initiation of lipids is delayed for 7 days or until transition out of ICU. Lipid start date - 4/22  Clinimix 5/15 @ 110 mL/hr to provide 132 grams protein (97% of goal) and 1874 kcal (121 % of goal)  Glucose infusion rate would be ~2.1 mg/kg/min (Maximum 5 mg/kg/min)  PLAN  At 1800 today:  Continue Clinimix 5/15 plain (non-electrolyte formula), Cont at 83 mL/hr due to fluid overload.  HELD lipids as outlined above, continue to hold with Propofol continued, resumed 4/19  Monitor fluid status closely.  TPN to contain standard multivitamins and trace elements.  Holding IVF per MD in setting of volume overload  Added 40mg  pepcid to TPN bag  Continue sensitive scale Novolog, CBGs q6hr  TPN lab panels on Mondays & Thursdays  BMET, Mag and Phos levels in am    Minda Ditto PharmD Pager [phone] 12/26/2017, 10:48 AM

## 2017-12-26 NOTE — Progress Notes (Signed)
PULMONARY / CRITICAL CARE MEDICINE   Name: Robyn Guerra MRN: [id number] DOB: February 16, 1956    ADMISSION DATE:  12/21/2017 CONSULTATION DATE:  12/20/2017  REFERRING MD:  Maylene Roes - [facility].   CHIEF COMPLAINT:  Dyspnea.  BRIEF SUMMARY:   62 year old woman with stage IV uterine cancer s/p ex lap, TAH, BSO, omentectomy, radical tumor debulking on 11/24/17.  Went home with sister and was readmitted with postop AKI, and pain. Underwent paracentesis of malignant ascites with improvement in symptoms.    While planning for palliative chemotherapy, the patient developed AF related to PE, SBO and aspiration pneumonia. While initially improved, she coughed out NGT 4/15 and vomited and aspirated, intubated 4/16  STUDIES:    CULTURES: Peritoneal fluid 4/9 >> negative  BCx2 4/11 >> negative  Sputum 4/16 >> ng  ANTIBIOTICS: Unasyn 4/11 >> 4/18  SIGNIFICANT EVENTS: 4/05  Admit  4/16  PCCM consulted for respiratory failure  4/20 TFs stopped again , appeared in suction line  LINES/TUBES: RUE PICC 4/11 >>  ETT 4/16 >>   SUBJECTIVE:      VITAL SIGNS: BP (!) 137/57   Pulse 69   Temp 99.1 F (37.3 C)   Resp 19   Ht 5\' 4"  (1.626 m)   Wt 284 lb 13.4 oz (129.2 kg)   LMP 04/20/2016   SpO2 95%   BMI 48.89 kg/m   HEMODYNAMICS:    VENTILATOR SETTINGS: Vent Mode: PRVC FiO2 (%):  [40 %] 40 % Set Rate:  [24 bmp] 24 bmp Vt Set:  [440 mL] 440 mL PEEP:  [5 cmH20] 5 cmH20 Plateau Pressure:  [20 cmH20-24 cmH20] 20 cmH20  INTAKE / OUTPUT: I/O last 3 completed shifts: In: 4755.5 [I.V.:4555.5; IV Piggyback:200] Out: 3244 [Urine:3550; Emesis/NG output:100]  PHYSICAL EXAMINATION: General: critically ill appearing female in NAD on vent  HEENT: MM pink/moist, ETT Neuro: Sedate on  precedex + fent ,  RASS-2 CV: RRR, Nl S1/S2 and -M/R/G PULM: Decreased BS in all lung fields WN:UUVO, non-tender, bsx4 active  Extremities: warm/dry, 1-2+ generalized edema  Skin: no rashes or  lesions  LABS:  BMET Recent Labs  Lab 12/24/17 0445 12/25/17 0400 12/26/17 0530  NA 143 139 138  K 3.5 3.7 3.6  CL 109 109 108  CO2 23 21* 20*  BUN 32* 39* 45*  CREATININE 2.38* 2.46* 2.37*  GLUCOSE 151* 141* 165*   Electrolytes Recent Labs  Lab 12/24/17 0445 12/25/17 0400 12/26/17 0530  CALCIUM 8.3* 8.1* 8.2*  MG 2.3 2.4 2.1  PHOS 5.6* 5.2* 4.8*   CBC Recent Labs  Lab 12/24/17 0445 12/25/17 0400 12/26/17 0530  WBC 16.0* 17.2* 16.1*  HGB 7.1* 7.1* 7.1*  HCT 23.1* 23.9* 24.0*  PLT 416* 400 349    Coag's No results for input(s): APTT, INR in the last 168 hours.  Sepsis Markers No results for input(s): LATICACIDVEN, PROCALCITON, O2SATVEN in the last 168 hours.  ABG Recent Labs  Lab 12/20/17 1135 12/20/17 1227 12/20/17 1645  PHART 7.264* 7.216* 7.287*  PCO2ART 45.4 53.5* 42.3  PO2ART 80.0* 87.8 126*    Liver Enzymes Recent Labs  Lab 12/20/17 0912 12/22/17 0450 12/26/17 0530  AST 14* 16 17  ALT 14 13* 10*  ALKPHOS 81 85 113  BILITOT 0.5 0.4 0.3  ALBUMIN 2.0* 1.7* 1.6*    Cardiac Enzymes No results for input(s): TROPONINI, PROBNP in the last 168 hours.  Glucose Recent Labs  Lab 12/24/17 2345 12/25/17 0527 12/25/17 1142 12/25/17 1729 12/25/17 2320 12/26/17  0528  GLUCAP 138* 147* 154* 145* 157* 151*    Imaging Dg Chest Port 1 View  Result Date: 12/26/2017 CLINICAL DATA:  Acute respiratory failure, endometrial malignancy with peritoneal carcinomatosis. EXAM: PORTABLE CHEST 1 VIEW COMPARISON:  Portable chest x-ray of December 25, 2017 FINDINGS: The lungs are borderline hypoinflated. There is patchy increased density at both bases and in the right mid lung. The findings are accentuated by patient rotation today. The cardiac silhouette remains enlarged. The pulmonary vascularity is mildly prominent centrally. The endotracheal tube tip projects approximately 3 cm above the carina. The esophagogastric tube tip and proximal port project below the  GE junction. The right-sided PICC line tip projects over the midportion of the SVC. IMPRESSION: Fairly stable appearance of the chest allowing for patient rotation today. There is bibasilar atelectasis or pneumonia. Stable cardiomegaly with mild central pulmonary vascular prominence. The support tubes are in reasonable position. Electronically Signed   By: David  Martinique M.D.   On: 12/26/2017 07:42   I reviewed CXR myself, pulmonary edema noted  DISCUSSION: 62 y/o unfortunate female with advanced cancer s/p debulking surgery , intubated 4/16 for aspiration pneumonia Current concerns are dropping Hb, inability to tolerate TFs & agitation when sedation lowered , unable to wean due to above  ASSESSMENT / PLAN:  PULMONARY A: Acute Hypoxic Respiratory Failure - due to aspiration pneumonia requiring intubation. P:   Active diureses today with lasix drip PRVC 8 cc/kg  Daily WUA/SBts Follow intermittent CXR PRN albuterol No trach/peg per sister  CARDIOVASCULAR A:  Atrial fibrillation  P:  Tele  Hold heparin gtt for Hb drop LCB - no CPR or ACLS  RENAL A:   AKI onStage III CKD - clinically volume overloaded since admission with edema, particularly of lower extremities.  Remains in + fluid balance. P:   Trend BMP / urinary output Replace electrolytes as indicated Avoid nephrotoxic agents, ensure adequate renal perfusion Lasix 10 mg/hr drip x24 hours Zaroxolyn 10 mg PO x1  GASTROINTESTINAL A:   SBO - conservatively managed with NG suctioning, does not tolerate Tfs , attempted x 2, with reglan Moderate Malnutrition   P:   NPO  Monitor LFT's while on TPN    HEMATOLOGIC A:   Anemia - suspect of chronic disease.  Thrombocytosis - likely reactive  Malignancy related and post operative PE.  P:  Dc IV heparin due to  Hb drop Trend CBC    INFECTIOUS A:   Leukocytosis secondary to aspiration pneumonia.    P:   Trend WBC / fever curve  Monitor off abx   ENDOCRINE A:   At  Risk Hyperglycemia - on TPN P:   Monitor glucose   NEUROLOGIC A:   No delirium pre-intubation. Comfortable on current sedation regimen P:   RASS goal: 0 to -1  Fentanyl gtt for pain control  Ct precedex to enable weaning Use versed prn  FAMILY  - Updates: Spoke with sister at length, after a long discussion, decision was made that we will attempt diureses x2 days, goal is to extubate for patient to spend sometime with family prior to comfort.  If renal function deteriorates or patient does not wean after diureses then will transition to comfort.  That seems to be the goal of the sister at this point.  - CODE STATUS: Partial code (no CPR, ACLS).  See above discussion.   The patient is critically ill with multiple organ systems failure and requires high complexity decision making for assessment and support, frequent  evaluation and titration of therapies, application of advanced monitoring technologies and extensive interpretation of multiple databases.   Critical Care Time devoted to patient care services described in this note is  35  Minutes. This time reflects time of care of this signee Dr Jennet Maduro. This critical care time does not reflect procedure time, or teaching time or supervisory time of PA/NP/Med student/Med Resident etc but could involve care discussion time.  Rush Farmer, M.D. [facility] Pulmonary/Critical Care Medicine. Pager: [phone]. After hours pager: [phone].  12/26/2017, 10:43 AM

## 2017-12-27 ENCOUNTER — Inpatient Hospital Stay: Admit: 2017-12-27 | Payer: BLUE CROSS/BLUE SHIELD | Admitting: Obstetrics & Gynecology

## 2017-12-27 ENCOUNTER — Inpatient Hospital Stay (HOSPITAL_COMMUNITY): Payer: BLUE CROSS/BLUE SHIELD

## 2017-12-27 DIAGNOSIS — C541 Malignant neoplasm of endometrium: Secondary | ICD-10-CM

## 2017-12-27 LAB — POCT I-STAT 7, (LYTES, BLD GAS, ICA,H+H)
ACID-BASE DEFICIT: 5 mmol/L — AB (ref 0.0–2.0)
Bicarbonate: 21.1 mmol/L (ref 20.0–28.0)
CALCIUM ION: 1.22 mmol/L (ref 1.15–1.40)
HEMATOCRIT: 23 % — AB (ref 36.0–46.0)
Hemoglobin: 7.8 g/dL — ABNORMAL LOW (ref 12.0–15.0)
O2 SAT: 94 %
PCO2 ART: 46.5 mmHg (ref 32.0–48.0)
PO2 ART: 89 mmHg (ref 83.0–108.0)
POTASSIUM: 3.8 mmol/L (ref 3.5–5.1)
Sodium: 138 mmol/L (ref 135–145)
TCO2: 22 mmol/L (ref 22–32)
pH, Arterial: 7.27 — ABNORMAL LOW (ref 7.350–7.450)

## 2017-12-27 LAB — BASIC METABOLIC PANEL
Anion gap: 10 (ref 5–15)
BUN: 54 mg/dL — AB (ref 6–20)
CO2: 21 mmol/L — ABNORMAL LOW (ref 22–32)
CREATININE: 2.45 mg/dL — AB (ref 0.44–1.00)
Calcium: 8.5 mg/dL — ABNORMAL LOW (ref 8.9–10.3)
Chloride: 107 mmol/L (ref 101–111)
GFR calc Af Amer: 24 mL/min — ABNORMAL LOW (ref >60)
GFR, EST NON AFRICAN AMERICAN: 20 mL/min — AB (ref >60)
Glucose, Bld: 163 mg/dL — ABNORMAL HIGH (ref 65–99)
Potassium: 3.8 mmol/L (ref 3.5–5.1)
SODIUM: 138 mmol/L (ref 135–145)

## 2017-12-27 LAB — CBC
HCT: 23.8 % — ABNORMAL LOW (ref 36.0–46.0)
Hemoglobin: 7.1 g/dL — ABNORMAL LOW (ref 12.0–15.0)
MCH: 22 pg — AB (ref 26.0–34.0)
MCHC: 29.8 g/dL — AB (ref 30.0–36.0)
MCV: 73.9 fL — ABNORMAL LOW (ref 78.0–100.0)
PLATELETS: 417 10*3/uL — AB (ref 150–400)
RBC: 3.22 MIL/uL — ABNORMAL LOW (ref 3.87–5.11)
RDW: 19.3 % — ABNORMAL HIGH (ref 11.5–15.5)
WBC: 18.4 10*3/uL — ABNORMAL HIGH (ref 4.0–10.5)

## 2017-12-27 LAB — GLUCOSE, CAPILLARY
GLUCOSE-CAPILLARY: 159 mg/dL — AB (ref 65–99)
GLUCOSE-CAPILLARY: 162 mg/dL — AB (ref 65–99)
GLUCOSE-CAPILLARY: 145 mg/dL — AB (ref 65–99)
Glucose-Capillary: 144 mg/dL — ABNORMAL HIGH (ref 65–99)
Glucose-Capillary: 176 mg/dL — ABNORMAL HIGH (ref 65–99)

## 2017-12-27 LAB — MAGNESIUM: MAGNESIUM: 1.9 mg/dL (ref 1.7–2.4)

## 2017-12-27 LAB — PHOSPHORUS: Phosphorus: 4.4 mg/dL (ref 2.5–4.6)

## 2017-12-27 SURGERY — HYSTERECTOMY, TOTAL, ABDOMINAL, WITH SALPINGECTOMY
Anesthesia: Choice | Laterality: Bilateral

## 2017-12-27 MED ORDER — FUROSEMIDE 10 MG/ML IJ SOLN
10.0000 mg/h | INTRAVENOUS | Status: AC
  Administered 2017-12-27: 10 mg/h via INTRAVENOUS
  Filled 2017-12-27: qty 20

## 2017-12-27 MED ORDER — METOLAZONE 5 MG PO TABS
10.0000 mg | ORAL_TABLET | Freq: Once | ORAL | Status: DC
  Filled 2017-12-27: qty 2

## 2017-12-27 MED ORDER — TRACE MINERALS CR-CU-MN-SE-ZN 10-1000-500-60 MCG/ML IV SOLN
INTRAVENOUS | Status: AC
  Administered 2017-12-27: 17:00:00 via INTRAVENOUS
  Filled 2017-12-27: qty 2000

## 2017-12-27 MED ORDER — POTASSIUM CHLORIDE 10 MEQ/50ML IV SOLN
10.0000 meq | INTRAVENOUS | Status: AC
  Administered 2017-12-27 (×3): 10 meq via INTRAVENOUS
  Filled 2017-12-27 (×3): qty 50

## 2017-12-27 MED ORDER — MAGNESIUM SULFATE 2 GM/50ML IV SOLN
2.0000 g | Freq: Once | INTRAVENOUS | Status: AC
  Administered 2017-12-27: 2 g via INTRAVENOUS
  Filled 2017-12-27: qty 50

## 2017-12-27 MED ORDER — FAT EMULSION PLANT BASED 20 % IV EMUL
240.0000 mL | INTRAVENOUS | Status: AC
  Administered 2017-12-27: 240 mL via INTRAVENOUS
  Filled 2017-12-27: qty 250

## 2017-12-27 MED ORDER — FENTANYL CITRATE (PF) 2500 MCG/50ML IJ SOLN
25.0000 ug/h | INTRAMUSCULAR | Status: DC
Start: 2017-12-27 — End: 2017-12-28
  Administered 2017-12-27 – 2017-12-28 (×3): 400 ug/h via INTRAVENOUS
  Filled 2017-12-27 (×4): qty 50

## 2017-12-27 NOTE — Progress Notes (Signed)
I stat ABG done at 0412.   ABG results: pH 7.27 , PCO2 46.5, PO2 89, HCO3 21.1

## 2017-12-27 NOTE — Progress Notes (Signed)
PULMONARY / CRITICAL CARE MEDICINE   Name: Robyn Guerra MRN: [id number] DOB: Sep 15, 1955    ADMISSION DATE:  12/19/2017 CONSULTATION DATE:  12/20/2017  REFERRING MD:  Maylene Roes - [facility].   CHIEF COMPLAINT:  Dyspnea.  BRIEF SUMMARY:   62 year old woman with stage IV uterine cancer s/p ex lap, TAH, BSO, omentectomy, radical tumor debulking on 11/24/17.  Went home with sister and was readmitted with postop AKI, and pain. Underwent paracentesis of malignant ascites with improvement in symptoms.    While planning for palliative chemotherapy, the patient developed AF related to PE, SBO and aspiration pneumonia. While initially improved, she coughed out NGT 4/15 and vomited and aspirated, intubated 4/16  STUDIES:    CULTURES: Peritoneal fluid 4/9 >> negative  BCx2 4/11 >> negative  Sputum 4/16 >> ng  ANTIBIOTICS: Unasyn 4/11 >> 4/18  SIGNIFICANT EVENTS: 4/05  Admit  4/16  PCCM consulted for respiratory failure  4/20 TFs stopped again , appeared in suction line  LINES/TUBES: RUE PICC 4/11 >>  ETT 4/16 >>   SUBJECTIVE:    Responded to lasix drip, put out 5L but only negative 1..  VITAL SIGNS: BP 100/62   Pulse 76   Temp (!) 100.9 F (38.3 C)   Resp (!) 26   Ht 5\' 4"  (1.626 m)   Wt 297 lb 9.9 oz (135 kg)   LMP 04/20/2016   SpO2 92%   BMI 51.09 kg/m   HEMODYNAMICS:    VENTILATOR SETTINGS: Vent Mode: PRVC FiO2 (%):  [40 %-50 %] 40 % Set Rate:  [24 bmp] 24 bmp Vt Set:  [440 mL] 440 mL PEEP:  [5 cmH20] 5 cmH20 Plateau Pressure:  [14 cmH20-29 cmH20] 25 cmH20  INTAKE / OUTPUT: I/O last 3 completed shifts: In: 4266.7 [I.V.:3996.7; NG/GT:70; IV Piggyback:200] Out: 8242 [Urine:5895]  PHYSICAL EXAMINATION: General: critically ill appearing female in NAD on vent, RR in the 30s unable to tolerate wean when I switched her myself. HEENT: [place]/AT, PERRL, EOM-I and MMM Neuro: Sedate but arousable CV: RRR, Nl S1/S2 and -M/R/G. PULM: Coarse BS diffusely GI: Soft,  NT, ND and +BS Extremities: warm/dry, 1-2+ generalized edema  Skin: no rashes or lesions  LABS:  BMET Recent Labs  Lab 12/25/17 0400 12/26/17 0530 12/27/17 0412 12/27/17 0500  NA 139 138 138 138  K 3.7 3.6 3.8 3.8  CL 109 108  --  107  CO2 21* 20*  --  21*  BUN 39* 45*  --  54*  CREATININE 2.46* 2.37*  --  2.45*  GLUCOSE 141* 165*  --  163*   Electrolytes Recent Labs  Lab 12/25/17 0400 12/26/17 0530 12/27/17 0500  CALCIUM 8.1* 8.2* 8.5*  MG 2.4 2.1 1.9  PHOS 5.2* 4.8* 4.4   CBC Recent Labs  Lab 12/25/17 0400 12/26/17 0530 12/27/17 0412 12/27/17 0500  WBC 17.2* 16.1*  --  18.4*  HGB 7.1* 7.1* 7.8* 7.1*  HCT 23.9* 24.0* 23.0* 23.8*  PLT 400 349  --  417*   Coag's No results for input(s): APTT, INR in the last 168 hours.  Sepsis Markers No results for input(s): LATICACIDVEN, PROCALCITON, O2SATVEN in the last 168 hours.  ABG Recent Labs  Lab 12/20/17 1227 12/20/17 1645 12/27/17 0412  PHART 7.216* 7.287* 7.270*  PCO2ART 53.5* 42.3 46.5  PO2ART 87.8 126* 89.0   Liver Enzymes Recent Labs  Lab 12/20/17 0912 12/22/17 0450 12/26/17 0530  AST 14* 16 17  ALT 14 13* 10*  ALKPHOS 81 85  113  BILITOT 0.5 0.4 0.3  ALBUMIN 2.0* 1.7* 1.6*   Cardiac Enzymes No results for input(s): TROPONINI, PROBNP in the last 168 hours.  Glucose Recent Labs  Lab 12/25/17 2320 12/26/17 0528 12/26/17 1133 12/26/17 1810 12/27/17 0134 12/27/17 0630  GLUCAP 157* 151* 142* 153* 144* 159*   Imaging Dg Chest Port 1 View  Result Date: 12/27/2017 CLINICAL DATA:  ET tube position EXAM: PORTABLE CHEST 1 VIEW COMPARISON:  12/26/2017 FINDINGS: Endotracheal tube, right PICC line and NG tube remain in place, unchanged. Cardiomegaly with vascular congestion. Bilateral airspace opacities, right greater than left could reflect asymmetric edema. Bibasilar atelectasis. IMPRESSION: Bilateral asymmetric airspace disease, favor asymmetric edema. Bibasilar atelectasis. Electronically  Signed   By: Rolm Baptise M.D.   On: 12/27/2017 07:27   I reviewed CXR myself, pulmonary edema noted  DISCUSSION: 62 y/o unfortunate female with advanced cancer s/p debulking surgery , intubated 4/16 for aspiration pneumonia Current concerns are dropping Hb, inability to tolerate TFs & agitation when sedation lowered , unable to wean due to above  ASSESSMENT / PLAN:  PULMONARY A: Acute Hypoxic Respiratory Failure - due to aspiration pneumonia requiring intubation. P:   Active diureses today with lasix drip again Hold off weaning give high RR and metabolic acidosis Daily WUA/SBts Follow intermittent CXR PRN albuterol No trach/peg per sister  CARDIOVASCULAR A:  Atrial fibrillation  P:  Tele  Hold heparin gtt for Hb drop LCB - no CPR or ACLS  RENAL A:   AKI onStage III CKD - clinically volume overloaded since admission with edema, particularly of lower extremities.  Remains in + fluid balance. P:   Trend BMP / urinary output Replace electrolytes as indicated Avoid nephrotoxic agents, ensure adequate renal perfusion Lasix 10 mg/hr drip x24 hours Zaroxolyn 10 mg PO x1 Will speak to pharmacy to attempt and minimize volume as able  GASTROINTESTINAL A:   SBO - conservatively managed with NG suctioning, does not tolerate Tfs , attempted x 2, with reglan Moderate Malnutrition   P:   NPO  Monitor LFT's while on TPN    HEMATOLOGIC A:   Anemia - suspect of chronic disease.  Thrombocytosis - likely reactive  Malignancy related and post operative PE.  P:  Dc IV heparin due to Hb drop Trend CBC    INFECTIOUS A:   Leukocytosis secondary to aspiration pneumonia.    P:   Trend WBC / fever curve  Monitor off abx   ENDOCRINE A:   At Risk Hyperglycemia - on TPN P:   Monitor glucose   NEUROLOGIC A:   No delirium pre-intubation. Comfortable on current sedation regimen P:   RASS goal: 0 to -1  Fentanyl gtt for pain control  Ct precedex to enable weaning Use versed  prn  FAMILY  - Updates: Per conversation with sister, no CPR/cardioversion/trach/peg, if continues to improve with diureses will continue support for now, if no deteriorates or renal function worsens then will progress to comfort.  Appreciate input from palliative and their assistance.  - CODE STATUS: Partial code (no CPR, ACLS).  See above discussion.   The patient is critically ill with multiple organ systems failure and requires high complexity decision making for assessment and support, frequent evaluation and titration of therapies, application of advanced monitoring technologies and extensive interpretation of multiple databases.   Critical Care Time devoted to patient care services described in this note is  35  Minutes. This time reflects time of care of this signee Dr Jennet Maduro.  This critical care time does not reflect procedure time, or teaching time or supervisory time of PA/NP/Med student/Med Resident etc but could involve care discussion time.  Rush Farmer, M.D. [facility] Pulmonary/Critical Care Medicine. Pager: [phone]. After hours pager: [phone].  12/27/2017, 8:57 AM

## 2017-12-27 NOTE — Progress Notes (Signed)
Daily Progress Note   Patient Name: Robyn Guerra       Date: 12/27/2017 DOB: [birth year]  Age: 62 y.o. MRN#: [id number] Attending Physician: Kipp Brood, MD Primary Care Physician: Lin Landsman, MD Admit Date: 12/18/2017  Reason for Consultation/Follow-up: Establishing goals of care  Subjective: Patient remains intubated and mechanically ventilated.  Hospital course complicated by drop in her hemoglobin, inability to tolerate tube feeds, agitation and sedation is decreased, inability to being weaned from the ventilator.  No family present at bedside today  Length of Stay: 15  Current Medications: Scheduled Meds:  . chlorhexidine gluconate (MEDLINE KIT)  15 mL Mouth Rinse BID  . Chlorhexidine Gluconate Cloth  6 each Topical Daily  . insulin aspart  0-9 Units Subcutaneous Q6H  . mouth rinse  15 mL Mouth Rinse 10 times per day  . metolazone  10 mg Oral Once  . sodium chloride flush  10-40 mL Intracatheter Q12H    Continuous Infusions: . dexmedetomidine (PRECEDEX) IV infusion 1.8 mcg/kg/hr (12/27/17 1327)  . TPN (CLINIMIX) Adult without lytes     And  . Fat emulsion    . fentaNYL infusion INTRAVENOUS 400 mcg/hr (12/27/17 0410)  . furosemide (LASIX) infusion 10 mg/hr (12/27/17 1201)  . potassium chloride 10 mEq (12/27/17 1309)  . TPN (CLINIMIX) Adult without lytes 83 mL/hr at 12/27/17 0300    PRN Meds: albuterol, fentaNYL, guaiFENesin-dextromethorphan, hydrALAZINE, lidocaine, midazolam, phenol, sodium chloride flush  Physical Exam         Critically ill appearing lady is on the vent Diminished breath sounds Has generalized edema Has OG tube Regular heart sounds Abdomen does not appear distended  Vital Signs: BP (!) 175/68   Pulse 86   Temp (!) 100.4 F (38 C)    Resp (!) 26   Ht '5\' 4"'$  (1.626 m)   Wt 135 kg (297 lb 9.9 oz)   LMP 04/20/2016   SpO2 95%   BMI 51.09 kg/m  SpO2: SpO2: 95 % O2 Device: O2 Device: Ventilator O2 Flow Rate: O2 Flow Rate (L/min): 10 L/min  Intake/output summary:   Intake/Output Summary (Last 24 hours) at 12/27/2017 1408 Last data filed at 12/27/2017 1103 Gross per 24 hour  Intake 4443.78 ml  Output 5895 ml  Net -1451.22 ml   LBM: Last BM Date: 12/20/17 Baseline Weight: Weight:  110.2 kg (242 lb 15.2 oz) Most recent weight: Weight: 135 kg (297 lb 9.9 oz) Palliative performance scale 10%       Palliative Assessment/Data:    Flowsheet Rows     Most Recent Value  Intake Tab  Referral Department  Hospitalist  Unit at Time of Referral  ICU  Palliative Care Primary Diagnosis  Cancer  Date Notified  12/15/17  Palliative Care Type  New Palliative care  Reason for referral  Clarify Goals of Care, Pain  Date of Admission  12/19/2017  Date first seen by Palliative Care  12/16/17  # of days Palliative referral response time  1 Day(s)  # of days IP prior to Palliative referral  6  Clinical Assessment  Palliative Performance Scale Score  50%  Pain Max last 24 hours  8  Pain Min Last 24 hours  3  Psychosocial & Spiritual Assessment  Palliative Care Outcomes  Patient/Family meeting held?  Yes  Palliative Care Outcomes  Clarified goals of care      Patient Active Problem List   Diagnosis Date Noted  . Malnutrition of moderate degree 12/18/2017  . Renal failure 12/12/2017  . Iron deficiency anemia due to chronic blood loss 12/26/2017  . Wound dehiscence, surgical, initial encounter 12/19/2017  . Acute prerenal failure ([place]) 12/05/2017  . Abdominal pain 12/16/2017  . Other constipation 12/05/2017  . Family history of cancer 12/11/2017  . Renal failure, acute ([place]) 12/23/2017  . 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 Peritoneal carcinomatosis ([place])   . Endometrial cancer ([place]) 11/01/2017  . Morbid  obesity with BMI of 45.0-49.9, adult ([place]) 11/01/2017    Palliative Care Assessment & Plan   Patient Profile:   Assessment: Advanced stage IV uterine cancer status post exploratory laparotomy omentectomy radical tumor debulking 3-20 1-19. Aspiration pneumonia Ventilator dependent respiratory failure Acute hypoxic respiratory failure Atrial fibrillation Hospital course complicated by drop in her hemoglobin, inability to tolerate tube feeds, agitation and sedation is decreased, inability to being weaned from the ventilator.   Discussions/Recommendations/Plan: - Chart reviewed and discussed with bedside RN. Plan being to attempt aggressive diuresis with Lasix, proceed with one-way extubation once medically maximized.  She is documented -1138 yesterday. There is a high possibility that the patient will require transition to full comfort measures based on how she does once she is extubated.  This was discussed with family yesterday by Dr. Rowe Pavy with palliative care.  - Will attempt to reach out to sister later today.  Code Status:    Code Status Orders  (From admission, onward)        Start     Ordered   12/21/17 1350  Limited resuscitation (code)  Continuous    Question Answer Comment  In the event of cardiac or respiratory ARREST: Initiate Code Blue, Call Rapid Response No   In the event of cardiac or respiratory ARREST: Perform CPR No   In the event of cardiac or respiratory ARREST: Perform Intubation/Mechanical Ventilation Yes   In the event of cardiac or respiratory ARREST: Use NIPPV/BiPAp only if indicated Yes   In the event of cardiac or respiratory ARREST: Administer ACLS medications if indicated No   In the event of cardiac or respiratory ARREST: Perform Defibrillation or Cardioversion if indicated No      12/21/17 1354    Code Status History    Date Active Date Inactive Code Status Order ID Comments User Context   01/03/2018 1648 12/21/2017 1354 Full Code [id number]  Dorothyann Gibbs, NP Inpatient   11/24/2017 1904 11/27/2017 2046 Full Code [id number]  Isabel Caprice, MD Inpatient       Prognosis:   guarded  Discharge Planning:  Anticipated hospital death versus transfer to residential hospice depending on patient's hospital course and disease trajectory.   Palliative medicine team will continue to follow.  Thank you for allowing the Palliative Medicine Team to assist in the care of this patient.   Time In: 0900 Time Out: 0920 Total Time 20 Prolonged Time Billed  no       Greater than 50%  of this time was spent counseling and coordinating care related to the above assessment and plan.  Micheline Rough, MD [place] Team [phone]  Please contact Palliative Medicine Team phone at [phone] for questions and concerns.

## 2017-12-27 NOTE — Progress Notes (Signed)
PHARMACY - ADULT TOTAL PARENTERAL NUTRITION CONSULT NOTE   Pharmacy Consult for TPN Indication: SBO, inability to tolerate TF  Patient Measurements: Height: 5\' 4"  (162.6 cm) Weight: 297 lb 9.9 oz (135 kg) IBW/kg (Calculated) : 54.7 TPN AdjBW (KG): 68.6 Body mass index is 51.09 kg/m. Usual Weight: 30 lb weight loss per Dietitian notes 4/8  Insulin Requirements:  6 units SSI Novolog in past 24 hrs  Current Nutrition: TPN  Off propofol   IVF: D5-NaBicarb d/c.  Remains on heparin, pain/sedation continuous infusions.  Central access: PICC 4/11 TPN start date: 4/15  Recent Labs    12/26/17 0530 12/27/17 0412 12/27/17 0500  NA 138 138 138  K 3.6 3.8 3.8  CL 108  --  107  CO2 20*  --  21*  GLUCOSE 165*  --  163*  BUN 45*  --  54*  CREATININE 2.37*  --  2.45*  CALCIUM 8.2*  --  8.5*  PHOS 4.8*  --  4.4  MG 2.1  --  1.9  ALBUMIN 1.6*  --   --   ALKPHOS 113  --   --   AST 17  --   --   ALT 10*  --   --   BILITOT 0.3  --   --   TRIG 131  --   --   PREALBUMIN <5*  --   --     ASSESSMENT                                                                                       Recurrent Uterine Ca, liver mets, ascites, small bowel obstruction.  Significant events:  4/20: TF held after RN noted some TF during oropharyngeal suctioning. Remove electrolytes from TPN formula due to hyperphosphatemia. 4/22: Lytes remaining wnl with no-electrolyte TPN formula. Furosemide infusion, repleting K/Magnesium. 4/23: 5L UOP, continuing Furosemide infusion, K/Mag IV  Today:   Glucose - range 145 to 154 on SSI q6h  Electrolytes - K, Mag Phos remain wnl with increased SCr, bolus repletion    Renal - SCr continues elevated (2.45).   LFTs - below ULN (4/18)  TGs - 119 (4/17), 131 (4/22)  Prealbumin  < 5 (4/16), <5 (4/22)  Volume overload:  I/O net (+) daily .  Weight 129 kg (baseline weight 110 kg on 4/5).  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    NUTRITIONAL GOALS                                                                                              RD recs (4/16): Kcal:  1191-4782; Protein:  >/= 136 g  Based on published guidelines, while the patient meets ICU status the initiation of lipids is delayed for 7 days or until transition  out of ICU. Lipid start date - 4/22  Clinimix 5/15 @ 110 mL/hr to provide 132 grams protein (97% of goal) and 1874 kcal (121 % of goal)  Glucose infusion rate would be ~2.1 mg/kg/min (Maximum 5 mg/kg/min)  PLAN                                                                                                                         At 1800 today:  Continue Clinimix 5/15 plain (non-electrolyte formula), Cont at 83 mL/hr due to fluid overload.  Resume Lipids today  Monitor fluid status closely.  TPN to contain standard multivitamins and trace elements.  Holding IVF per MD in setting of volume overload  Added 40mg  pepcid to TPN bag  Continue sensitive scale Novolog, CBGs q6hr  TPN lab panels on Mondays & Thursdays  BMET, Mag and Phos levels in am    Minda Ditto PharmD Pager [phone] 12/27/2017, 11:07 AM

## 2017-12-28 ENCOUNTER — Inpatient Hospital Stay (HOSPITAL_COMMUNITY): Payer: BLUE CROSS/BLUE SHIELD

## 2017-12-28 LAB — BLOOD GAS, ARTERIAL
Acid-base deficit: 4.2 mmol/L — ABNORMAL HIGH (ref 0.0–2.0)
Bicarbonate: 20.7 mmol/L (ref 20.0–28.0)
DRAWN BY: 232811
FIO2: 0.4
O2 SAT: 93 %
PATIENT TEMPERATURE: 38.2
PCO2 ART: 42.1 mmHg (ref 32.0–48.0)
PEEP: 5 cmH2O
PH ART: 7.32 — AB (ref 7.350–7.450)
RATE: 24 resp/min
VT: 0.44 mL
pO2, Arterial: 75.5 mmHg — ABNORMAL LOW (ref 83.0–108.0)

## 2017-12-28 LAB — BASIC METABOLIC PANEL
Anion gap: 12 (ref 5–15)
BUN: 59 mg/dL — AB (ref 6–20)
CHLORIDE: 103 mmol/L (ref 101–111)
CO2: 21 mmol/L — ABNORMAL LOW (ref 22–32)
CREATININE: 2.55 mg/dL — AB (ref 0.44–1.00)
Calcium: 8.4 mg/dL — ABNORMAL LOW (ref 8.9–10.3)
GFR calc Af Amer: 22 mL/min — ABNORMAL LOW (ref >60)
GFR, EST NON AFRICAN AMERICAN: 19 mL/min — AB (ref >60)
Glucose, Bld: 175 mg/dL — ABNORMAL HIGH (ref 65–99)
Potassium: 3.3 mmol/L — ABNORMAL LOW (ref 3.5–5.1)
SODIUM: 136 mmol/L (ref 135–145)

## 2017-12-28 LAB — GLUCOSE, CAPILLARY
GLUCOSE-CAPILLARY: 110 mg/dL — AB (ref 65–99)
GLUCOSE-CAPILLARY: 114 mg/dL — AB (ref 65–99)
Glucose-Capillary: 175 mg/dL — ABNORMAL HIGH (ref 65–99)
Glucose-Capillary: 113 mg/dL — ABNORMAL HIGH (ref 65–99)

## 2017-12-28 LAB — PHOSPHORUS: Phosphorus: 4.3 mg/dL (ref 2.5–4.6)

## 2017-12-28 LAB — CBC
HEMATOCRIT: 22.9 % — AB (ref 36.0–46.0)
Hemoglobin: 7 g/dL — ABNORMAL LOW (ref 12.0–15.0)
MCH: 22.3 pg — ABNORMAL LOW (ref 26.0–34.0)
MCHC: 30.6 g/dL (ref 30.0–36.0)
MCV: 72.9 fL — AB (ref 78.0–100.0)
PLATELETS: 390 10*3/uL (ref 150–400)
RBC: 3.14 MIL/uL — ABNORMAL LOW (ref 3.87–5.11)
RDW: 18.9 % — AB (ref 11.5–15.5)
WBC: 14.4 10*3/uL — AB (ref 4.0–10.5)

## 2017-12-28 LAB — MAGNESIUM: Magnesium: 2.1 mg/dL (ref 1.7–2.4)

## 2017-12-28 MED ORDER — FENTANYL 2500MCG IN NS 250ML (10MCG/ML) PREMIX INFUSION
25.0000 ug/h | INTRAVENOUS | Status: DC
  Administered 2017-12-28 – 2017-12-29 (×3): 400 ug/h via INTRAVENOUS
  Filled 2017-12-28 (×3): qty 250

## 2017-12-28 MED ORDER — METOLAZONE 5 MG PO TABS
10.0000 mg | ORAL_TABLET | Freq: Once | ORAL | Status: AC
  Administered 2017-12-28: 10 mg via ORAL
  Filled 2017-12-28: qty 2

## 2017-12-28 MED ORDER — POTASSIUM CHLORIDE 10 MEQ/50ML IV SOLN
10.0000 meq | INTRAVENOUS | Status: AC
  Administered 2017-12-28 (×4): 10 meq via INTRAVENOUS
  Filled 2017-12-28 (×4): qty 50

## 2017-12-28 MED ORDER — FAMOTIDINE IN NACL 20-0.9 MG/50ML-% IV SOLN
20.0000 mg | Freq: Two times a day (BID) | INTRAVENOUS | Status: DC
  Administered 2017-12-28: 20 mg via INTRAVENOUS
  Filled 2017-12-28: qty 50

## 2017-12-28 MED ORDER — MIDAZOLAM HCL 2 MG/2ML IJ SOLN
1.0000 mg | INTRAMUSCULAR | Status: DC | PRN
  Administered 2017-12-28 – 2017-12-29 (×12): 2 mg via INTRAVENOUS
  Filled 2017-12-28 (×12): qty 2

## 2017-12-28 MED ORDER — DEXTROSE 5 % IV SOLN
10.0000 mg/h | INTRAVENOUS | Status: DC
  Administered 2017-12-28 (×2): 10 mg/h via INTRAVENOUS
  Filled 2017-12-28 (×2): qty 25

## 2017-12-28 MED ORDER — ACETAMINOPHEN 650 MG RE SUPP
650.0000 mg | RECTAL | Status: DC | PRN
  Administered 2017-12-28: 650 mg via RECTAL
  Filled 2017-12-28: qty 1

## 2017-12-28 NOTE — Progress Notes (Signed)
Spoke with patient's sister Jaselle Pryer. Goals of care meeting set for Jan 24, 2018 at 0900. Palliative care made aware of set meeting time.

## 2017-12-28 NOTE — Progress Notes (Signed)
Patient responding to verbal stimuli and mouthing one word responses intermittently. Heart regular rate and rhythm.  Lungs diminished with coarse breath sounds.  Abdomen with firmness noted around the incision. No signs of infection at the incision.  Small opening at the lower aspect of the incision healing with no evidence of infection. Continue with plan from Critical Care Team.

## 2017-12-28 NOTE — Progress Notes (Signed)
PHARMACY - ADULT TOTAL PARENTERAL NUTRITION CONSULT NOTE   Pharmacy Consult for TPN Indication: SBO, inability to tolerate TF  Patient Measurements: Height: 5\' 4"  (162.6 cm) Weight: 297 lb 9.9 oz (135 kg) IBW/kg (Calculated) : 54.7 TPN AdjBW (KG): 68.6 Body mass index is 51.09 kg/m. Usual Weight: 30 lb weight loss per Dietitian notes 4/8  Insulin Requirements:  7 units SSI Novolog in past 24 hrs  Current Nutrition: TPN  Off propofol   IVF: None d/t volume overload.  Remains on heparin, pain/sedation continuous infusions, lasix continuous infusion.  Central access: PICC 4/11 TPN start date: 4/15  Recent Labs    12/26/17 0530  12/27/17 0500 12/28/17 0624  NA 138   < > 138 136  K 3.6   < > 3.8 3.3*  CL 108  --  107 103  CO2 20*  --  21* 21*  GLUCOSE 165*  --  163* 175*  BUN 45*  --  54* 59*  CREATININE 2.37*  --  2.45* 2.55*  CALCIUM 8.2*  --  8.5* 8.4*  PHOS 4.8*  --  4.4 4.3  MG 2.1  --  1.9 2.1  ALBUMIN 1.6*  --   --   --   ALKPHOS 113  --   --   --   AST 17  --   --   --   ALT 10*  --   --   --   BILITOT 0.3  --   --   --   TRIG 131  --   --   --   PREALBUMIN <5*  --   --   --    < > = values in this interval not displayed.    ASSESSMENT                                                                                       Recurrent Uterine Ca, liver mets, ascites, small bowel obstruction.  Significant events:  4/20: TF held after RN noted some TF during oropharyngeal suctioning. Remove electrolytes from TPN formula due to hyperphosphatemia. 4/22: Lytes remaining wnl with no-electrolyte TPN formula. Furosemide infusion, repleting K/Magnesium. 4/23: 5L UOP, continuing Furosemide infusion, K/Mag IV  Today:   Remains significantly volume overloaded.  Per MD, d/c TPN for now to minimize fluid intake and not consistent with overall goals of care.  PLAN                                                                                                                          Now: - Decrease to 1/2 rate TPN x1 hour, then d/c. - Change famotidine  to 20mg  IV q12h - Pharmacy to sign off TPN notes, please reconsult if a change in clinical status warrants re-initiation.   Gretta Arab PharmD, BCPS Pager [phone] 12/28/2017 8:56 AM

## 2017-12-28 NOTE — Progress Notes (Signed)
Chest PT. not done on last round due to >'d acuity.

## 2017-12-28 NOTE — Progress Notes (Signed)
Pt overnight had frequent periods of agitation. Hr in 120's and RR in the mid 30's with decreased O2 sats. Pt arms would flail with agitation. Versed pushes given frequently. CC MD called and notified of frequent agitation periods. CC MD increased frequency of versed. Pt T-max was 101.64F. Rectal tylenol ordered. Lasix gtts continued. 3.6L overnight.

## 2017-12-28 NOTE — Progress Notes (Signed)
Pt running fever. CC MD wrote order for rectal tylenol.

## 2017-12-28 NOTE — Progress Notes (Signed)
PULMONARY / CRITICAL CARE MEDICINE   Name: Robyn Guerra MRN: [id number] DOB: 07/10/1956    ADMISSION DATE:  12/30/2017 CONSULTATION DATE:  12/20/2017  REFERRING MD:  Maylene Roes - [facility].   CHIEF COMPLAINT:  Dyspnea.  BRIEF SUMMARY:   62 year old woman with stage IV uterine cancer s/p ex lap, TAH, BSO, omentectomy, radical tumor debulking on 11/24/17.  Went home with sister and was readmitted with postop AKI, and pain. Underwent paracentesis of malignant ascites with improvement in symptoms.    While planning for palliative chemotherapy, the patient developed AF related to PE, SBO and aspiration pneumonia. While initially improved, she coughed out NGT 4/15 and vomited and aspirated, intubated 4/16  STUDIES:    CULTURES: Peritoneal fluid 4/9 >> negative  BCx2 4/11 >> negative  Sputum 4/16 >> ng  ANTIBIOTICS: Unasyn 4/11 >> 4/18  SIGNIFICANT EVENTS: 4/05  Admit  4/16  PCCM consulted for respiratory failure  4/20 TFs stopped again , appeared in suction line  LINES/TUBES: RUE PICC 4/11 >>  ETT 4/16 >>   SUBJECTIVE:    Responded to lasix drip, put out 7.425 L but only negative 1.7.  VITAL SIGNS: BP (!) 122/56   Pulse 76   Temp 100.2 F (37.9 C)   Resp (!) 26   Ht 5\' 4"  (1.626 m)   Wt 297 lb 9.9 oz (135 kg)   LMP 04/20/2016   SpO2 95%   BMI 51.09 kg/m   HEMODYNAMICS:    VENTILATOR SETTINGS: Vent Mode: PRVC FiO2 (%):  [40 %] 40 % Set Rate:  [24 bmp] 24 bmp Vt Set:  [440 mL] 440 mL PEEP:  [5 cmH20] 5 cmH20 Plateau Pressure:  [19 cmH20-23 cmH20] 21 cmH20  INTAKE / OUTPUT: I/O last 3 completed shifts: In: 6936.4 [I.V.:6736.4; IV Piggyback:200] Out: 59563 [OVFIE:33295; Emesis/NG output:200]  PHYSICAL EXAMINATION: General: Critically ill appearing female, in respiratory distress when weaning but otherwise comfortable HEENT: [place]/AT, PERRL, EOM-I and MMM Neuro: Sedate but arousable, moving all ext to pain CV: RRR, Nl S1/S2 and -M/R/G PULM: Coarse BS  diffusely  GI: Soft, NT, ND and -BS Extremities: 2+ edema and -tenderness Skin: no rashes or lesions  LABS:  BMET Recent Labs  Lab 12/26/17 0530 12/27/17 0412 12/27/17 0500 12/28/17 0624  NA 138 138 138 136  K 3.6 3.8 3.8 3.3*  CL 108  --  107 103  CO2 20*  --  21* 21*  BUN 45*  --  54* 59*  CREATININE 2.37*  --  2.45* 2.55*  GLUCOSE 165*  --  163* 175*   Electrolytes Recent Labs  Lab 12/26/17 0530 12/27/17 0500 12/28/17 0624  CALCIUM 8.2* 8.5* 8.4*  MG 2.1 1.9 2.1  PHOS 4.8* 4.4 4.3   CBC Recent Labs  Lab 12/26/17 0530 12/27/17 0412 12/27/17 0500 12/28/17 0624  WBC 16.1*  --  18.4* 14.4*  HGB 7.1* 7.8* 7.1* 7.0*  HCT 24.0* 23.0* 23.8* 22.9*  PLT 349  --  417* 390   Coag's No results for input(s): APTT, INR in the last 168 hours.  Sepsis Markers No results for input(s): LATICACIDVEN, PROCALCITON, O2SATVEN in the last 168 hours.  ABG Recent Labs  Lab 12/27/17 0412 12/28/17 0430  PHART 7.270* 7.320*  PCO2ART 46.5 42.1  PO2ART 89.0 75.5*   Liver Enzymes Recent Labs  Lab 12/22/17 0450 12/26/17 0530  AST 16 17  ALT 13* 10*  ALKPHOS 85 113  BILITOT 0.4 0.3  ALBUMIN 1.7* 1.6*   Cardiac Enzymes  No results for input(s): TROPONINI, PROBNP in the last 168 hours.  Glucose Recent Labs  Lab 12/27/17 0134 12/27/17 0630 12/27/17 1134 12/27/17 1746 12/27/17 2307 12/28/17 0501  GLUCAP 144* 159* 162* 145* 176* 175*   Imaging No results found. I reviewed CXR myself, pulmonary edema noted  DISCUSSION: 62 y/o unfortunate female with advanced cancer s/p debulking surgery , intubated 4/16 for aspiration pneumonia Current concerns are dropping Hb, inability to tolerate TFs & agitation when sedation lowered , unable to wean due to above  ASSESSMENT / PLAN:  PULMONARY A: Acute Hypoxic Respiratory Failure - due to aspiration pneumonia requiring intubation. P:   Continue active diureses for one more day Active weaning as able Daily WUA/SBts CXR  in AM PRN albuterol No trach/peg per sister  CARDIOVASCULAR A:  Atrial fibrillation  P:  Tele  Hold heparin gtt for Hb drop LCB - no CPR or ACLS  RENAL A:   AKI onStage III CKD - clinically volume overloaded since admission with edema, particularly of lower extremities.  Remains in + fluid balance. P:   Trend BMP/urinary output Replace electrolytes as indicated Avoid nephrotoxic agents, ensure adequate renal perfusion Lasix 10 mg/hr drip x24 hours Zaroxolyn 10 mg PO x1 D/C TPN to avoid additional volumes  GASTROINTESTINAL A:   SBO - conservatively managed with NG suctioning, does not tolerate Tfs , attempted x 2, with reglan Moderate Malnutrition   P:   NPO  D/C TPN  HEMATOLOGIC A:   Anemia - suspect of chronic disease.  Thrombocytosis - likely reactive  Malignancy related and post operative PE.  P:  Dc IV heparin due to Hb drop Trend CBC    INFECTIOUS A:   Leukocytosis secondary to aspiration pneumonia.    P:   Trend WBC / fever curve  Monitor off abx   ENDOCRINE A:   At Risk Hyperglycemia - on TPN P:   Monitor glucose   NEUROLOGIC A:   No delirium pre-intubation. Comfortable on current sedation regimen P:   RASS goal: 0 to -1  Fentanyl gtt for pain control  Ct precedex to enable weaning Use versed prn  FAMILY  - Updates: No family bedside, RN to call to arrange meeting for AM.  Tomorrow will be the third day with active diureses and slow worsening renal function.  Will not continue further beyond tomorrow.  Sister to come in and if patient is weaning will perform a one way extubation if not then will proceed with comfort care.  - CODE STATUS: Partial code (no CPR, ACLS).  See above discussion.   The patient is critically ill with multiple organ systems failure and requires high complexity decision making for assessment and support, frequent evaluation and titration of therapies, application of advanced monitoring technologies and extensive  interpretation of multiple databases.   Critical Care Time devoted to patient care services described in this note is  35  Minutes. This time reflects time of care of this signee Dr Jennet Maduro. This critical care time does not reflect procedure time, or teaching time or supervisory time of PA/NP/Med student/Med Resident etc but could involve care discussion time.  Rush Farmer, M.D. [facility] Pulmonary/Critical Care Medicine. Pager: [phone]. After hours pager: [phone].  12/28/2017, 8:40 AM

## 2017-12-29 DIAGNOSIS — Z7189 Other specified counseling: Secondary | ICD-10-CM

## 2017-12-29 LAB — MAGNESIUM: MAGNESIUM: 2 mg/dL (ref 1.7–2.4)

## 2017-12-29 LAB — CBC
HCT: 22.2 % — ABNORMAL LOW (ref 36.0–46.0)
Hemoglobin: 6.9 g/dL — CL (ref 12.0–15.0)
MCH: 22.4 pg — AB (ref 26.0–34.0)
MCHC: 31.1 g/dL (ref 30.0–36.0)
MCV: 72.1 fL — AB (ref 78.0–100.0)
PLATELETS: 439 10*3/uL — AB (ref 150–400)
RBC: 3.08 MIL/uL — AB (ref 3.87–5.11)
RDW: 18.7 % — AB (ref 11.5–15.5)
WBC: 15.6 10*3/uL — AB (ref 4.0–10.5)

## 2017-12-29 LAB — BLOOD GAS, ARTERIAL
Acid-base deficit: 1.2 mmol/L (ref 0.0–2.0)
Bicarbonate: 23.3 mmol/L (ref 20.0–28.0)
DRAWN BY: 232811
FIO2: 40
O2 Saturation: 95.3 %
PEEP: 5 cmH2O
Patient temperature: 37.8
RATE: 24 resp/min
VT: 440 mL
pCO2 arterial: 42.8 mmHg (ref 32.0–48.0)
pH, Arterial: 7.36 (ref 7.350–7.450)
pO2, Arterial: 82.3 mmHg — ABNORMAL LOW (ref 83.0–108.0)

## 2017-12-29 LAB — BASIC METABOLIC PANEL
Anion gap: 12 (ref 5–15)
BUN: 63 mg/dL — ABNORMAL HIGH (ref 6–20)
CALCIUM: 8.4 mg/dL — AB (ref 8.9–10.3)
CO2: 22 mmol/L (ref 22–32)
Chloride: 100 mmol/L — ABNORMAL LOW (ref 101–111)
Creatinine, Ser: 2.51 mg/dL — ABNORMAL HIGH (ref 0.44–1.00)
GFR calc Af Amer: 23 mL/min — ABNORMAL LOW (ref >60)
GFR, EST NON AFRICAN AMERICAN: 20 mL/min — AB (ref >60)
Glucose, Bld: 117 mg/dL — ABNORMAL HIGH (ref 65–99)
Potassium: 3.5 mmol/L (ref 3.5–5.1)
SODIUM: 134 mmol/L — AB (ref 135–145)

## 2017-12-29 LAB — PHOSPHORUS: Phosphorus: 5.5 mg/dL — ABNORMAL HIGH (ref 2.5–4.6)

## 2017-12-29 LAB — GLUCOSE, CAPILLARY: Glucose-Capillary: 121 mg/dL — ABNORMAL HIGH (ref 65–99)

## 2017-12-29 MED ORDER — MORPHINE BOLUS VIA INFUSION
5.0000 mg | INTRAVENOUS | Status: DC | PRN
  Administered 2017-12-29: 20 mg via INTRAVENOUS
  Administered 2017-12-29 (×5): 10 mg via INTRAVENOUS
  Filled 2017-12-29: qty 20

## 2017-12-29 MED ORDER — MORPHINE 100MG IN NS 100ML (1MG/ML) PREMIX INFUSION
10.0000 mg/h | INTRAVENOUS | Status: DC
  Administered 2017-12-29 (×3): 10 mg/h via INTRAVENOUS
  Filled 2017-12-29 (×3): qty 100

## 2017-12-29 MED ORDER — MIDAZOLAM HCL 50 MG/10ML IJ SOLN
0.5000 mg/h | INTRAMUSCULAR | Status: DC
  Administered 2017-12-29: 5 mg/h via INTRAVENOUS
  Administered 2017-12-29: 5.5 mg/h via INTRAVENOUS
  Filled 2017-12-29 (×2): qty 10

## 2017-12-29 MED ORDER — GLYCOPYRROLATE 0.2 MG/ML IJ SOLN
0.1000 mg | INTRAMUSCULAR | Status: DC
  Administered 2017-12-29 (×2): 0.1 mg via INTRAVENOUS
  Filled 2017-12-29 (×2): qty 1

## 2017-12-29 MED ORDER — MIDAZOLAM BOLUS VIA INFUSION
10.0000 mg | Freq: Once | INTRAVENOUS | Status: AC
  Administered 2017-12-29: 10 mg via INTRAVENOUS
  Filled 2017-12-29: qty 10

## 2018-01-04 NOTE — Progress Notes (Signed)
Robyn Guerra is a 62 y.o. female patient. 1. Acute prerenal failure ([place])   2. Acute renal failure with tubular necrosis (HCC)   3. Other constipation   4. Malignant ascites   5. Pain of upper abdomen   6. Emesis   7. Uterine cancer ([place])   8. Abdominal pain   9. SBO (small bowel obstruction) ([place])   10. Small bowel obstruction ([place])   11. Hypoxia   12. Encounter for orogastric (OG) tube placement   13. Intubation of airway performed without difficulty   14. Encounter for imaging study to confirm orogastric (OG) tube placement   15. Respiratory failure ([place])   16. Acute respiratory failure with hypoxia (HCC)   17. H/O small bowel obstruction   18. Acute respiratory failure ([place])   19. History of ETT    Past Medical History:  Diagnosis Date  . Cancer [facility])    endometrial cancer  . Fibroids   . GERD (gastroesophageal reflux disease)   . Hypertension   . Sinusitis    Current Facility-Administered Medications  Medication Dose Route Frequency Provider Last Rate Last Dose  . morphine 100mg  in NS 175mL (1mg /mL) infusion - premix  10 mg/hr Intravenous Continuous Rush Farmer, MD 10 mL/hr at 01-25-18 1035 10 mg/hr at 01-25-18 1035  . morphine bolus via infusion 5-20 mg  5-20 mg Intravenous Q15 min PRN Rush Farmer, MD   10 mg at 25-Jan-2018 1035   Allergies  Allergen Reactions  . Codeine Nausea And Vomiting    Room spins   Principal Problem:   Acute prerenal failure (HCC) Active Problems:   Endometrial cancer (HCC)   Morbid obesity with BMI of 45.0-49.9, adult (HCC)   Secondary malignant neoplasm of omentum (HCC)   Peritoneal carcinomatosis (HCC)   Iron deficiency anemia due to chronic blood loss   Wound dehiscence, surgical, initial encounter   Abdominal pain   Other constipation   Renal failure, acute (HCC)   Renal failure   Malnutrition of moderate degree  Blood pressure 114/69, pulse 76, temperature 99.5 F (37.5 C), resp. rate (!) 26, height 5\' 4"  (1.626  m), weight 283 lb 8.2 oz (128.6 kg), last menstrual period 04/20/2016, SpO2 98 %.  Subjective Objective: General Appearance:  In no acute distress.   Vital signs: (most recent): Blood pressure 114/69, pulse 76, temperature 99.5 F (37.5 C), resp. rate (!) 26, height 5\' 4"  (1.626 m), weight 283 lb 8.2 oz (128.6 kg), last menstrual period 04/20/2016, SpO2 98 %.    Assessment & Plan [name]  is a 62 y.o.  year old with stage IV carcinosarcoma of the uterus s/p ex lap, TAH, BSO, omentectomy, radical tumor debulking on 11/24/17 readmitted with a prolonged postoperative ileus, PE, Afib, AKI, anemia and aspiration PNA. She continues to be in respiratory failure; s/p multiple failed attempts to extubate. Critical care notes appreciated.  Comfort care planned at this point.  Will continue to follow with you.   [name]

## 2018-01-04 NOTE — Progress Notes (Signed)
No heart sounds auscultated by Griffin Dakin, RN and Polly Cobia, RN. Pt expired at 1800 on 01-23-2018. Family present at bedside. MD paged.

## 2018-01-04 NOTE — Progress Notes (Signed)
60 cc fentanyl wasted in sink. Polly Cobia, RN witness to waste.

## 2018-01-04 NOTE — Progress Notes (Signed)
Nutrition Brief Note  Chart reviewed. Pt last seen by RD on 4/25. Family meeting with Palliative Care occurred earlier this AM. RD spoke with RN who reports that pt is now transitioning to comfort care. Per Dr. Pura Spice note, plan for one-way extubation once morphine drip has been started. TPN has been discontinued.  No further nutrition interventions warranted at this time.  Please re-consult as needed.      Jarome Matin, MS, RD, LDN, [facility] Inpatient Clinical Dietitian Pager # [phone] After hours/weekend pager # [phone]

## 2018-01-04 NOTE — Accreditation Note (Signed)
Restraints not reported to CMS Pursuant to regulation 482.13(G0 (3) use of soft wrist restraints was logged 01/03/2018

## 2018-01-04 NOTE — Progress Notes (Signed)
eLink Physician-Brief Progress Note Patient Name: Robyn Guerra DOB: December 21, 1955 MRN: [id number]   Date of Service  2017-12-31  HPI/Events of Note  Hemoglobin slowly trending down, 6.9 today morning  eICU Interventions  Noted plans for possible terminale extubation so shall hold off on ordering prbc.     Intervention Category Minor Interventions: Other:  Sharia Reeve 12/31/2017, 5:34 AM

## 2018-01-04 NOTE — Progress Notes (Signed)
All lines and tubes removed from patient. Patient transferred to the morgue.

## 2018-01-04 NOTE — Progress Notes (Signed)
Introduced spiritual care as resource and provided support at bedside following terminal extubation.   Provided grief support and prayers with family.   [facility] / [facility] Chaplain Jerene Pitch, MDiv, [facility]

## 2018-01-04 NOTE — Progress Notes (Signed)
CRITICAL VALUE ALERT  Critical Value:  Hgb 6.9  Date & Time Notied:  22-Jan-2018 0515  Provider Notified: CCM   Orders Received/Actions taken: MD will hold orders for prbc at this time. Possible terminale extubation today.

## 2018-01-04 NOTE — Progress Notes (Signed)
PULMONARY / CRITICAL CARE MEDICINE   Name: Robyn Guerra MRN: [id number] DOB: 15-Nov-1955    ADMISSION DATE:  12/11/2017 CONSULTATION DATE:  12/20/2017  REFERRING MD:  Maylene Roes - [facility].   CHIEF COMPLAINT:  Dyspnea.  BRIEF SUMMARY:   62 year old woman with stage IV uterine cancer s/p ex lap, TAH, BSO, omentectomy, radical tumor debulking on 11/24/17.  Went home with sister and was readmitted with postop AKI, and pain. Underwent paracentesis of malignant ascites with improvement in symptoms.    While planning for palliative chemotherapy, the patient developed AF related to PE, SBO and aspiration pneumonia. While initially improved, she coughed out NGT 4/15 and vomited and aspirated, intubated 4/16   Subjective: Failing weaning again this AM  STUDIES:    CULTURES: Peritoneal fluid 4/9 >> negative  BCx2 4/11 >> negative  Sputum 4/16 >> ng  ANTIBIOTICS: Unasyn 4/11 >> 4/18  SIGNIFICANT EVENTS: 4/05  Admit  4/16  PCCM consulted for respiratory failure  4/20 TFs stopped again , appeared in suction line  LINES/TUBES: RUE PICC 4/11 >>  ETT 4/16 >>   SUBJECTIVE:    Responded to lasix drip, put out 7.425 L but only negative 1.7.  VITAL SIGNS: BP (!) 153/90   Pulse 96   Temp 99 F (37.2 C)   Resp (!) 26   Ht 5\' 4"  (1.626 m)   Wt 283 lb 8.2 oz (128.6 kg)   LMP 04/20/2016   SpO2 98%   BMI 48.66 kg/m   HEMODYNAMICS:    VENTILATOR SETTINGS: Vent Mode: PRVC FiO2 (%):  [40 %] 40 % Set Rate:  [24 bmp] 24 bmp Vt Set:  [440 mL] 440 mL PEEP:  [5 cmH20] 5 cmH20 Pressure Support:  [15 cmH20] 15 cmH20 Plateau Pressure:  [23 cmH20-25 cmH20] 23 cmH20  INTAKE / OUTPUT: I/O last 3 completed shifts: In: 6325.3 [I.V.:6065.3; Other:10; IV Piggyback:250] Out: 39767 [HALPF:79024; Emesis/NG output:250]  PHYSICAL EXAMINATION: General: Ill appearing, NAD HEENT: [place]/AT, PERRL, EOM-I and MMM Neuro: Sedate but arousable CV: RRR, Nl S1/S2 and -M/R/G PULM: Coarse BS  diffusely GI: Soft, NT, ND and +BS Extremities: 2+ edema Skin: no rashes or lesions  LABS:  BMET Recent Labs  Lab 12/27/17 0500 12/28/17 0624 Jan 07, 2018 0428  NA 138 136 134*  K 3.8 3.3* 3.5  CL 107 103 100*  CO2 21* 21* 22  BUN 54* 59* 63*  CREATININE 2.45* 2.55* 2.51*  GLUCOSE 163* 175* 117*   Electrolytes Recent Labs  Lab 12/27/17 0500 12/28/17 0624 Jan 07, 2018 0428  CALCIUM 8.5* 8.4* 8.4*  MG 1.9 2.1 2.0  PHOS 4.4 4.3 5.5*   CBC Recent Labs  Lab 12/27/17 0500 12/28/17 0624 01-07-18 0428  WBC 18.4* 14.4* 15.6*  HGB 7.1* 7.0* 6.9*  HCT 23.8* 22.9* 22.2*  PLT 417* 390 439*   Coag's No results for input(s): APTT, INR in the last 168 hours.  Sepsis Markers No results for input(s): LATICACIDVEN, PROCALCITON, O2SATVEN in the last 168 hours.  ABG Recent Labs  Lab 12/27/17 0412 12/28/17 0430 2018/01/07 0410  PHART 7.270* 7.320* 7.360  PCO2ART 46.5 42.1 42.8  PO2ART 89.0 75.5* 82.3*   Liver Enzymes Recent Labs  Lab 12/26/17 0530  AST 17  ALT 10*  ALKPHOS 113  BILITOT 0.3  ALBUMIN 1.6*   Cardiac Enzymes No results for input(s): TROPONINI, PROBNP in the last 168 hours.  Glucose Recent Labs  Lab 12/27/17 2307 12/28/17 0501 12/28/17 1156 12/28/17 1827 12/28/17 2317 01-07-2018 0612  GLUCAP 176*  175* 113* 110* 114* 121*   Imaging I reviewed CXR myself, ETT in good placement but pulmonary edema noted  DISCUSSION: 62 y/o unfortunate female with advanced cancer s/p debulking surgery , intubated 4/16 for aspiration pneumonia Current concerns are dropping Hb, inability to tolerate TFs & agitation when sedation lowered , unable to wean due to above  ASSESSMENT / PLAN:  PULMONARY A: Acute Hypoxic Respiratory Failure - due to aspiration pneumonia requiring intubation. P:   D/C lasix Extubate once morphine is started  CARDIOVASCULAR A:  Atrial fibrillation  P:  D/C tele and pressors  RENAL A:   AKI onStage III CKD - clinically volume  overloaded since admission with edema, particularly of lower extremities.  Remains in + fluid balance. P:   D/C further blood draws  GASTROINTESTINAL A:   SBO - conservatively managed with NG suctioning, does not tolerate Tfs , attempted x 2, with reglan Moderate Malnutrition   P:   D/C TPN  HEMATOLOGIC A:   Anemia - suspect of chronic disease.  Thrombocytosis - likely reactive  Malignancy related and post operative PE.  P:  D/C further blood draws   INFECTIOUS A:   Leukocytosis secondary to aspiration pneumonia.    P:   D/C abx  ENDOCRINE A:   At Risk Hyperglycemia - on TPN P:   D/C CBGs and SSI  NEUROLOGIC A:   No delirium pre-intubation. Comfortable on current sedation regimen P:   Morphine for comfort  FAMILY  - Updates: Sister updated bedside, will proceed with comfort care today.  - CODE STATUS: DNR, comfort for now  The patient is critically ill with multiple organ systems failure and requires high complexity decision making for assessment and support, frequent evaluation and titration of therapies, application of advanced monitoring technologies and extensive interpretation of multiple databases.   Critical Care Time devoted to patient care services described in this note is  35  Minutes. This time reflects time of care of this signee Dr Jennet Maduro. This critical care time does not reflect procedure time, or teaching time or supervisory time of PA/NP/Med student/Med Resident etc but could involve care discussion time.  Rush Farmer, M.D. [facility] Pulmonary/Critical Care Medicine. Pager: [phone]. After hours pager: [phone].  Jan 10, 2018, 9:53 AM

## 2018-01-04 DEATH — deceased

## 2018-02-04 NOTE — Death Summary Note (Signed)
DEATH SUMMARY   Patient Details  Name: Robyn Guerra MRN: [id number] DOB: 1956-05-11  Admission/Discharge Information   Admit Date:  12/12/2017  Date of Death: Date of Death: January 01, 2018  Time of Death: Time of Death: 56  Length of Stay: 11-24-22  Referring Physician: Lin Landsman, MD   Reason(s) for Hospitalization    Diagnoses  Preliminary cause of death:   Uterine cancer  Secondary Diagnoses (including complications and co-morbidities):  Principal Problem:   Acute prerenal failure ([place]) Active Problems:   Endometrial cancer ([place])   Morbid obesity with BMI of 45.0-49.9, adult ([place])   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  Went home with sister and was readmitted with postop AKI, and pain. Underwent paracentesis of malignant ascites with improvement in symptoms.    While planning for palliative chemotherapy, the patient developed AF related to PE, SBO and aspiration pneumonia. While initially improved, she coughed out NGT 4/15 and vomited and aspirated, intubated 4/16  Patient was aggressively diuresed in the ICU and an attempt at extubation was to be done but patient continued to fail and was very agitated.  Spoke with sister who is a Therapist, sports and after discussion decision was made to proceed with comfort measures.  Patient was started on morphine and extubated to expire shortly thereafter.      Pertinent Labs  and Studies  Significant Diagnostic Studies Ct Abdomen Pelvis Wo Contrast  Result Date: 12/15/2017 CLINICAL DATA:  Abdominal distension. Metastatic endometrial cancer. EXAM: CT ABDOMEN AND PELVIS WITHOUT CONTRAST TECHNIQUE: Multidetector CT imaging of the abdomen and pelvis was performed following the standard protocol without IV contrast. COMPARISON:  CT abdomen pelvis dated December 12, 2017. FINDINGS: Lower chest: Trace bilateral pleural effusions with bilateral lower lobe consolidation as seen on CTA chest from same day. Hepatobiliary: Two low-density lesions within the right hepatic lobe measuring up to 1.5 cm are similar to prior study. Excreted contrast within the gallbladder, which is otherwise unremarkable. No biliary dilatation. Pancreas: Mild atrophy. No ductal dilatation or surrounding inflammatory changes. Spleen: Normal in size without focal abnormality. Adrenals/Urinary Tract: The adrenal glands are unremarkable. No focal renal lesion. Contrast within the bilateral collecting systems and bladder. No hydronephrosis. Stomach/Bowel: Fluid-filled distention of the lower esophagus. New mild dilatation of multiple proximal and mid small bowel loops with a transition point likely in the mid abdomen. Oral contrast is seen within the colon. Vascular/Lymphatic: Mild aortic atherosclerosis. Stable retroperitoneal and right iliac lymphadenopathy. Reproductive: Status post hysterectomy. No adnexal masses. Other: Moderate ascites.  No pneumoperitoneum. Musculoskeletal: No acute or significant osseous findings. IMPRESSION: 1. New mildly dilated loops of proximal and mid small bowel, consistent with obstruction. Transition point is likely in the mid abdomen. 2. Moderate ascites. 3. Unchanged liver lesions and abdominal and right iliac lymphadenopathy, suspicious for metastatic disease. 4. Unchanged trace bilateral pleural effusions and bilateral lower lobe consolidation. 5.  Aortic atherosclerosis (ICD10-I70.0).  Electronically Signed   By: Orville Govern.D.  On: 12/15/2017 19:35   Dg Abd 1 View  Result Date: 12/23/2017 CLINICAL DATA:  Small-bowel obstruction EXAM: ABDOMEN - 1 VIEW COMPARISON:  12/22/2017 FINDINGS: Gastric tube remains in the stomach unchanged in position. Nonobstructive bowel gas pattern. Contrast in the colon from prior CT. IMPRESSION: NG in the stomach.  Normal bowel gas pattern. Electronically Signed   By: Franchot Gallo M.D.   On: 12/23/2017 08:03   Dg Abd 1 View  Result Date: 12/22/2017 CLINICAL DATA:  OG tube placement. EXAM: ABDOMEN - 1 VIEW COMPARISON:  12/21/2017 FINDINGS: An OG tube is noted with tip overlying the distal stomach. No other changes identified. IMPRESSION: OG tube with tip overlying the distal stomach. Electronically Signed   By: Margarette Canada M.D.   On: 12/22/2017 15:50   Dg Abd 1 View  Result Date: 12/21/2017 CLINICAL DATA:  Orogastric tube placement. EXAM: ABDOMEN - 1 VIEW COMPARISON:  Radiograph of December 20, 2017. FINDINGS: The bowel gas pattern is normal. Orogastric tube is seen to loop in the proximal stomach, with distal tip heading retrograde into distal esophagus. No radio-opaque calculi or other significant radiographic abnormality are seen. IMPRESSION: Orogastric tube is looped within proximal stomach, with distal tip in distal esophagus. There is no evidence of bowel obstruction or ileus. Electronically Signed   By: Marijo Conception, M.D.   On: 12/21/2017 10:34   Dg Abd 1 View  Result Date: 12/20/2017 CLINICAL DATA:  Nasogastric tube placement. EXAM: ABDOMEN - 1 VIEW COMPARISON:  Abdominal radiograph performed 12/18/2017 FINDINGS: The patient's enteric tube is noted ending overlying the body of the stomach. The visualized bowel gas pattern is grossly unremarkable, though difficult to fully assess due to motion artifact. No acute osseous abnormalities are seen. IMPRESSION: Enteric tube noted ending overlying the body of the stomach. Electronically Signed    By: Garald Balding M.D.   On: 12/20/2017 04:49   Dg Abd 1 View  Result Date: 12/16/2017 CLINICAL DATA:  Follow up small bowel obstruction EXAM: ABDOMEN - 1 VIEW COMPARISON:  12/15/2017 FINDINGS: Scattered mildly dilated loops of small bowel are noted. Nasogastric catheter is noted within the stomach. Contrast material is again seen within the colon. Contrast from the recent CT is also noted within the bladder. No bony abnormality is seen. IMPRESSION: Stable small bowel dilatation when compared with the previous exam. Electronically Signed   By: Inez Catalina M.D.   On: 12/16/2017 07:37   Dg Abd 1 View  Result Date: 12/15/2017 CLINICAL DATA:  Diffuse abdominal pain. EXAM: ABDOMEN - 1 VIEW COMPARISON:  12/14/2017. FINDINGS: IV contrast noted in the nondilated right renal collecting system/ureter/bladder. Oral contrast in the right colon. Mild persistent small bowel distention. IMPRESSION: 1. Mild persistent small bowel distention. Oral contrast again noted in the colon. No evidence of progressive bowel distention or free air. 2. IV contrast noted in a nondilated right renal collecting system/ureter, and bladder. Electronically Signed   By: Marcello Moores  Register   On: 12/15/2017 12:57   Ct Chest W Contrast  Addendum Date: 12/15/2017   ADDENDUM REPORT: 12/15/2017 15:53 ADDENDUM: In review, there is a filling defect within the proximal RIGHT upper lobe pulmonary artery consistent with acute pulmonary embolism. The lower lobe pulmonary arteries are not well evaluated due to technique as well as the lower lobe consolidation. Findings conveyed toCroft PA on 12/15/2017  at15:53. Electronically Signed   By: Suzy Bouchard M.D.   On: 12/15/2017 15:53   Result Date: 12/15/2017 CLINICAL DATA:  Recurrent uterine  cancer with malignant ascites and liver metastasis. EXAM: CT CHEST WITH CONTRAST TECHNIQUE: Multidetector CT imaging of the chest was performed during intravenous contrast administration. CONTRAST:  106mL  ISOVUE-300 IOPAMIDOL (ISOVUE-300) INJECTION 61% COMPARISON:  CT 12/12/2017, abdomen FINDINGS: Cardiovascular: No significant vascular findings. Normal heart size. No pericardial effusion. Mediastinum/Nodes: No axillary or supraclavicular adenopathy. No mediastinal adenopathy. The esophagus newly fluid-filled mildly expanded to 3 cm in diameter. Fluid extends to above the carina and into the thoracic inlet. No obstructing lesions identified Lungs/Pleura: There is consolidation in the LEFT and RIGHT lung base with air bronchograms. Query aspiration pneumonitis. Upper lungs are clear. Upper Abdomen: Moderate peritoneal free fluid. Fluid-filled stomach. Musculoskeletal: No aggressive osseous lesion. Subcutaneous cyst in the mid line anterior chest wall superficial to the sternum is likely benign. IMPRESSION: 1. Newly dilated fluid-filled esophagus. Concern for bowel obstruction. Recommend clinical correlation and consider NG tube. 2. LEFT and RIGHT lower lobe consolidation. Differential includes aspiration pneumonitis versus pneumonia versus less likely malignancy. 3. Ascites in the upper abdomen. Findings conveyed toFloor Nurse [place] 12/15/2017  at10:53. Electronically Signed: By: Suzy Bouchard M.D. On: 12/15/2017 10:53   Ct Abdomen Pelvis W Contrast  Result Date: 12/12/2017 CLINICAL DATA:  Uterine cancer. Constipation, abdominal pain. Acute blood loss anemia. EXAM: CT ABDOMEN AND PELVIS WITH CONTRAST TECHNIQUE: Multidetector CT imaging of the abdomen and pelvis was performed using the standard protocol following bolus administration of intravenous contrast. CONTRAST:  134mL OMNIPAQUE IOHEXOL 300 MG/ML  SOLN COMPARISON:  10/31/2017. FINDINGS: Lower chest: Trace left pleural effusion. Bibasilar volume loss. Heart is enlarged. No pericardial effusion. Hepatobiliary: There are two new low-attenuation lesions in the right hepatic lobe, measuring 10 mm (series 2, image 14) and 15 mm (image 17), respectively. Liver  and gallbladder are otherwise unremarkable. No biliary ductal dilatation. Pancreas: Negative. Spleen: Negative. Adrenals/Urinary Tract: Adrenal glands are unremarkable. Subcentimeter low-attenuation lesion in the right kidney is too small to characterize. Kidneys are otherwise unremarkable. Ureters are decompressed. Bladder is grossly unremarkable. Stomach/Bowel: Stomach, small bowel, appendix and colon are unremarkable. Vascular/Lymphatic: Atherosclerotic calcification of the arterial vasculature without abdominal aortic aneurysm. Abdominal retroperitoneal lymph nodes measure up to 11 mm in the aortocaval station (image 34), similar. Pelvic retroperitoneal lymph nodes are somewhat ill-defined, measuring up to approximately 10 mm along the right common iliac chain (image 52), similar. Reproductive: Hysterectomy.  No adnexal mass. Other: Large ascites.  Mild stranding in the small bowel mesentery. Musculoskeletal: Degenerative changes in the spine. No worrisome lytic or sclerotic lesions. IMPRESSION: 1. New right hepatic lobe lesions are worrisome for metastatic disease. 2. Large ascites. 3. Borderline abdominal and pelvic retroperitoneal adenopathy. 4. Trace left pleural effusion. 5.  Aortic atherosclerosis (ICD10-170.0). Electronically Signed   By: Lorin Picket M.D.   On: 12/12/2017 14:29   US Paracentesis  Result Date: 12/13/2017 INDICATION: Patient with history of stage IV carcinosarcoma of the uterus, ascites. Request made for diagnostic and therapeutic paracentesis. EXAM: ULTRASOUND GUIDED DIAGNOSTIC AND THERAPEUTIC PARACENTESIS MEDICATIONS: None COMPLICATIONS: None immediate. PROCEDURE: Informed written consent was obtained from the patient after a discussion of the risks, benefits and alternatives to treatment. A timeout was performed prior to the initiation of the procedure. Initial ultrasound scanning demonstrates a moderate amount of ascites within the left mid to lower abdominal quadrant. The left  mid to lower abdomen was prepped and draped in the usual sterile fashion. 1% lidocaine was used for local anesthesia. Following this, a 19 gauge, 7-cm, Yueh catheter was introduced. An ultrasound image was saved  for documentation purposes. The paracentesis was performed. The catheter was removed and a dressing was applied. The patient tolerated the procedure well without immediate post procedural complication. FINDINGS: A total of approximately 3 liters of hazy, blood-tinged fluid was removed. Samples were sent to the laboratory as requested by the clinical team. IMPRESSION: Successful ultrasound-guided diagnostic and therapeutic paracentesis yielding 3 liters of peritoneal fluid. Read by: Rowe Robert, PA-C Electronically Signed   By: Jacqulynn Cadet M.D.   On: 12/13/2017 11:11   Dg Chest Port 1 View  Result Date: 12/28/2017 CLINICAL DATA:  Respiratory failure, hypertension EXAM: PORTABLE CHEST 1 VIEW COMPARISON:  Portable exam 0901 hours compared to 0442 hours FINDINGS: Less severely rotated to RIGHT. Endotracheal tube tip projects above carina. Nasogastric tube extends into stomach. RIGHT arm PICC line tip projects over cavoatrial junction. Enlargement of cardiac silhouette. Bibasilar atelectasis. Upper lungs grossly clear. No definite pleural effusion or pneumothorax. IMPRESSION: Enlargement of cardiac silhouette with bibasilar atelectasis. Electronically Signed   By: Lavonia Dana M.D.   On: 12/28/2017 10:33   Dg Chest Port 1 View  Result Date: 12/28/2017 CLINICAL DATA:  Intubation, history endometrial cancer, hypertension EXAM: PORTABLE CHEST 1 VIEW COMPARISON:  Portable exam 0442 hours compared to 12/27/2017 FINDINGS: Tip of endotracheal tube projects 2.1 cm above carina. Nasogastric tube extends into abdomen. RIGHT arm PICC line tip projects over SVC. Rotated to the RIGHT. Enlargement of cardiac silhouette. Bibasilar opacities question atelectasis versus infiltrate. Upper lungs clear. No pleural  effusion, pneumothorax or acute osseous findings. IMPRESSION: Persistent bibasilar opacities question atelectasis versus infiltrate. Electronically Signed   By: Lavonia Dana M.D.   On: 12/28/2017 10:32   Dg Chest Port 1 View  Result Date: 12/27/2017 CLINICAL DATA:  ET tube position EXAM: PORTABLE CHEST 1 VIEW COMPARISON:  12/26/2017 FINDINGS: Endotracheal tube, right PICC line and NG tube remain in place, unchanged. Cardiomegaly with vascular congestion. Bilateral airspace opacities, right greater than left could reflect asymmetric edema. Bibasilar atelectasis. IMPRESSION: Bilateral asymmetric airspace disease, favor asymmetric edema. Bibasilar atelectasis. Electronically Signed   By: Rolm Baptise M.D.   On: 12/27/2017 07:27   Dg Chest Port 1 View  Result Date: 12/26/2017 CLINICAL DATA:  Acute respiratory failure, endometrial malignancy with peritoneal carcinomatosis. EXAM: PORTABLE CHEST 1 VIEW COMPARISON:  Portable chest x-ray of December 25, 2017 FINDINGS: The lungs are borderline hypoinflated. There is patchy increased density at both bases and in the right mid lung. The findings are accentuated by patient rotation today. The cardiac silhouette remains enlarged. The pulmonary vascularity is mildly prominent centrally. The endotracheal tube tip projects approximately 3 cm above the carina. The esophagogastric tube tip and proximal port project below the GE junction. The right-sided PICC line tip projects over the midportion of the SVC. IMPRESSION: Fairly stable appearance of the chest allowing for patient rotation today. There is bibasilar atelectasis or pneumonia. Stable cardiomegaly with mild central pulmonary vascular prominence. The support tubes are in reasonable position. Electronically Signed   By: David  [place]ique M.D.   On: 12/26/2017 07:42   Dg Chest Port 1 View  Result Date: 12/25/2017 CLINICAL DATA:  Acute respiratory failure.  Recent laparotomy. EXAM: PORTABLE CHEST 1 VIEW COMPARISON:  One day  prior FINDINGS: Endotracheal tube terminates 3.0 cm above carina.Nasogastric tube extends beyond the inferior aspect of the film. Right-sided PICC line terminates at the superior caval/atrial junction. Cardiomegaly accentuated by AP portable technique. No pleural effusion or pneumothorax. No congestive failure. Low lung volumes with resultant pulmonary interstitial prominence.  Similar right greater than left base atelectasis. IMPRESSION: Cardiomegaly and low lung volumes with mild bibasilar atelectasis. Reposition of endotracheal tube, now appropriate. Electronically Signed   By: Abigail Miyamoto M.D.   On: 12/25/2017 07:29   Dg Chest Port 1 View  Result Date: 12/24/2017 CLINICAL DATA:  Acute respiratory failure.  Ex-smoker. EXAM: PORTABLE CHEST 1 VIEW COMPARISON:  12/23/2017. FINDINGS: The endotracheal to tip is at the carina, directed toward the right mainstem bronchus. Nasogastric tube extending into the stomach. Right PICC tip in the inferior aspect of the superior vena cava. Stable enlarged cardiac silhouette. Decreased bibasilar atelectasis. The interstitial markings remain mildly prominent. Thoracic spine degenerative changes. IMPRESSION: 1. Low position of the endotracheal tube with its tip at the carina, directed toward the right mainstem bronchus. It is recommended that this be retracted 4 cm. 2. Decreased bibasilar atelectasis. 3. Stable cardiomegaly and mild chronic interstitial lung disease. Electronically Signed   By: Claudie Revering M.D.   On: 12/24/2017 07:45   Dg Chest Port 1 View  Result Date: 12/23/2017 CLINICAL DATA:  Hypoxia EXAM: PORTABLE CHEST 1 VIEW COMPARISON:  December 22, 2017 FINDINGS: Endotracheal tube tip is 2.8 cm above the carina. Central catheter tip is in the superior vena cava. Nasogastric tube tip and side port are below the diaphragm. No pneumothorax. There is airspace consolidation throughout the right lower lobe, increased. There is patchy infiltrate in the left lower lobe,  stable. There are small pleural effusions bilaterally. There is cardiomegaly with pulmonary vascularity within normal limits. No adenopathy. No bone lesions. IMPRESSION: Tube and catheter positions as described without pneumothorax. Increase in right lower lobe consolidation. A lesser degree of consolidation in the left base is stable. Small pleural effusions bilaterally are stable. Stable cardiomegaly. Electronically Signed   By: Lowella Grip III M.D.   On: 12/23/2017 07:50   Dg Chest Port 1 View  Result Date: 12/22/2017 CLINICAL DATA:  Respiratory failure EXAM: PORTABLE CHEST 1 VIEW COMPARISON:  12/20/2017 FINDINGS: Endotracheal tube is unchanged. NG tube coils in the proximal stomach with the distal aspect of this tube re entering the esophagus with the tip in the midthoracic esophagus. This likely will need to be completely removed and replaced to straightening. Cardiomegaly with vascular congestion. Low lung volumes with bibasilar atelectasis. Possible small layering effusions. IMPRESSION: NG tube remains looped with the distal aspect of the tip re entering the esophagus and the tip in the midthoracic esophagus. This likely will need to be completely removed and replaced to reduce the loop. Cardiomegaly, vascular congestion. Low lung volumes with bibasilar atelectasis and small effusions. Electronically Signed   By: Rolm Baptise M.D.   On: 12/22/2017 09:34   Dg Chest Port 1 View  Result Date: 12/20/2017 CLINICAL DATA:  Post intubation. EXAM: PORTABLE CHEST 1 VIEW COMPARISON:  Earlier today at L1 50 hours in FINDINGS: Endotracheal tube borderline low in position, 1.6 cm above carina. Patient rotated right. Right-sided PICC line terminates at low SVC. Nasogastric tube terminates in the distal esophagus, likely looped within. Cardiomegaly accentuated by AP portable technique. No pleural effusion or pneumothorax. Suspect mild pulmonary venous congestion, accentuated by low lung volumes. Left greater  than right base airspace disease. IMPRESSION: Endotracheal tube borderline low in position, 1.6 cm above carina. Consider retraction 2-3 cm. Nasogastric tube within the distal esophagus. Recommend advancement. Slightly worsened aeration with developing pulmonary venous congestion and right base atelectasis. Left base airspace disease remains. These results will be called to the ordering clinician or representative  by the Radiologist Assistant, and communication documented in the PACS or zVision Dashboard. Electronically Signed   By: Abigail Miyamoto M.D.   On: 12/20/2017 10:15   Dg Chest Port 1 View  Result Date: 12/20/2017 CLINICAL DATA:  Acute onset of shortness of breath. EXAM: PORTABLE CHEST 1 VIEW COMPARISON:  CT of the chest performed 12/15/2017 FINDINGS: The lungs are well-aerated. Left basilar airspace opacity is compatible with pneumonia. There is no evidence of pleural effusion or pneumothorax. The cardiomediastinal silhouette is borderline enlarged. No acute osseous abnormalities are seen. A right PICC is noted ending about the distal SVC. IMPRESSION: 1. Left basilar pneumonia noted. 2. Borderline cardiomegaly. Electronically Signed   By: Garald Balding M.D.   On: 12/20/2017 02:24   Dg Abd 2 Views  Result Date: 12/14/2017 CLINICAL DATA:  Nausea vomiting and abdominal pain EXAM: ABDOMEN - 2 VIEW COMPARISON:  CT 12/12/2017 FINDINGS: Decubitus views show no definite free air. Enteral contrast is present within the right colon. Development of mild gaseous enlargement of central small bowel loops measuring up to 3.6 cm. Surgical clips in the right upper quadrant. Clips in the left pelvis. IMPRESSION: 1. Interim development of gaseous enlargement of central small bowel loops but with colon gas present and enteral contrast in the right colon, findings could be secondary to an ileus with developing or partial obstruction felt less likely. Electronically Signed   By: Donavan Foil M.D.   On: 12/14/2017 14:37    Dg Abd Portable 1v  Result Date: 12/18/2017 CLINICAL DATA:  Small bowel obstruction. EXAM: PORTABLE ABDOMEN - 1 VIEW COMPARISON:  12/17/2017 FINDINGS: Nasogastric tube remains in place with tip in the distal gastric body. Oral contrast material persists throughout the colon. Several mildly dilated small bowel loops are again seen with the left abdomen. Surgical clips noted in the left pelvis. IMPRESSION: No significant change in several mildly dilated small bowel loops in left abdomen. Oral contrast material again seen throughout colon. Electronically Signed   By: Earle Gell M.D.   On: 12/18/2017 07:10   Dg Abd Portable 2v  Result Date: 12/17/2017 CLINICAL DATA:  62 year old female with metastatic endometrial cancer and small-bowel obstruction on recent CT Abdomen and Pelvis. EXAM: PORTABLE ABDOMEN - 2 VIEW COMPARISON:  12/16/2017 and earlier. FINDINGS: Supine and left-side-down lateral decubitus portable views of the abdomen at 0430 hours. Enteric tube remains in place and is looped in the proximal stomach with side hole up the level of the proximal gastric body. There remains a small volume of retained oral contrast in the ascending and descending colon. No pneumoperitoneum. There are small bowel air-fluid levels in the mid abdomen estimated at 32-38 millimeters diameter which is a stable caliber to those demonstrated by CT on 12/15/2017. Left pelvic surgical clips redemonstrated. Epigastric surgical clips are stable. No acute osseous abnormality identified. Minimally visible lung bases. IMPRESSION: 1. Stable enteric tube, side hole the level of the proximal gastric body. 2. No improvement in the bowel gas pattern since 12/15/2017, compatible with small bowel obstruction. 3. No free air in the abdomen. Electronically Signed   By: Genevie Ann M.D.   On: 12/17/2017 07:06   Ir Picc Placement Right >5 Yrs Inc Img Guide  Result Date: 12/15/2017 INDICATION: Patient with history recurrent uterine cancer ;  central venous access requested for chemotherapy. EXAM: RIGHT UPPER EXTREMITY PICC LINE PLACEMENT WITH ULTRASOUND AND FLUOROSCOPIC GUIDANCE MEDICATIONS: 1% lidocaine to skin and subcutaneous tissue ANESTHESIA/SEDATION: None FLUOROSCOPY TIME:  Fluoroscopy Time: 1 minutes 2  seconds (17 mGy). COMPLICATIONS: None immediate. PROCEDURE: The patient was advised of the possible risks and complications and agreed to undergo the procedure. The patient was then brought to the angiographic suite for the procedure. The right arm was prepped with chlorhexidine, draped in the usual sterile fashion using maximum barrier technique (cap and mask, sterile gown, sterile gloves, large sterile sheet, hand hygiene and cutaneous antisepsis) and infiltrated locally with 1% Lidocaine. Ultrasound demonstrated patency of the right brachial vein, and this was documented with an image. Under real-time ultrasound guidance, this vein was accessed with a 21 gauge micropuncture needle and image documentation was performed. A 0.018 wire was introduced in to the vein. Over this, a 5 [place] double lumen power injectable PICC was advanced to the lower SVC/right atrial junction. Fluoroscopy during the procedure and fluoro spot radiograph confirms appropriate catheter position. The catheter was flushed and covered with a sterile dressing. Catheter length: 44 cm IMPRESSION: Successful right arm power PICC line placement with ultrasound and fluoroscopic guidance. The catheter is ready for use. Read by: Rowe Robert, PA-C Electronically Signed   By: Jacqulynn Cadet M.D.   On: 12/15/2017 12:25    Microbiology No results found for this or any previous visit (from the past 240 hour(s)).  Lab Basic Metabolic Panel: No results for input(s): NA, K, CL, CO2, GLUCOSE, BUN, CREATININE, CALCIUM, MG, PHOS in the last 168 hours. Liver Function Tests: No results for input(s): AST, ALT, ALKPHOS, BILITOT, PROT, ALBUMIN in the last 168 hours. No results for  input(s): LIPASE, AMYLASE in the last 168 hours. No results for input(s): AMMONIA in the last 168 hours. CBC: No results for input(s): WBC, NEUTROABS, HGB, HCT, MCV, PLT in the last 168 hours. Cardiac Enzymes: No results for input(s): CKTOTAL, CKMB, CKMBINDEX, TROPONINI in the last 168 hours. Sepsis Labs: No results for input(s): PROCALCITON, WBC, LATICACIDVEN in the last 168 hours.  Procedures/Operations     YACOUB,WESAM 01/09/2018, 1:37 PM

## 2019-10-15 IMAGING — DX DG CHEST 1V PORT
1 series · 1 of 1 positions shown · non-contrast
Comparison: December 22, 2017

CLINICAL DATA: Hypoxia

EXAM:
PORTABLE CHEST 1 VIEW

[chest ap]
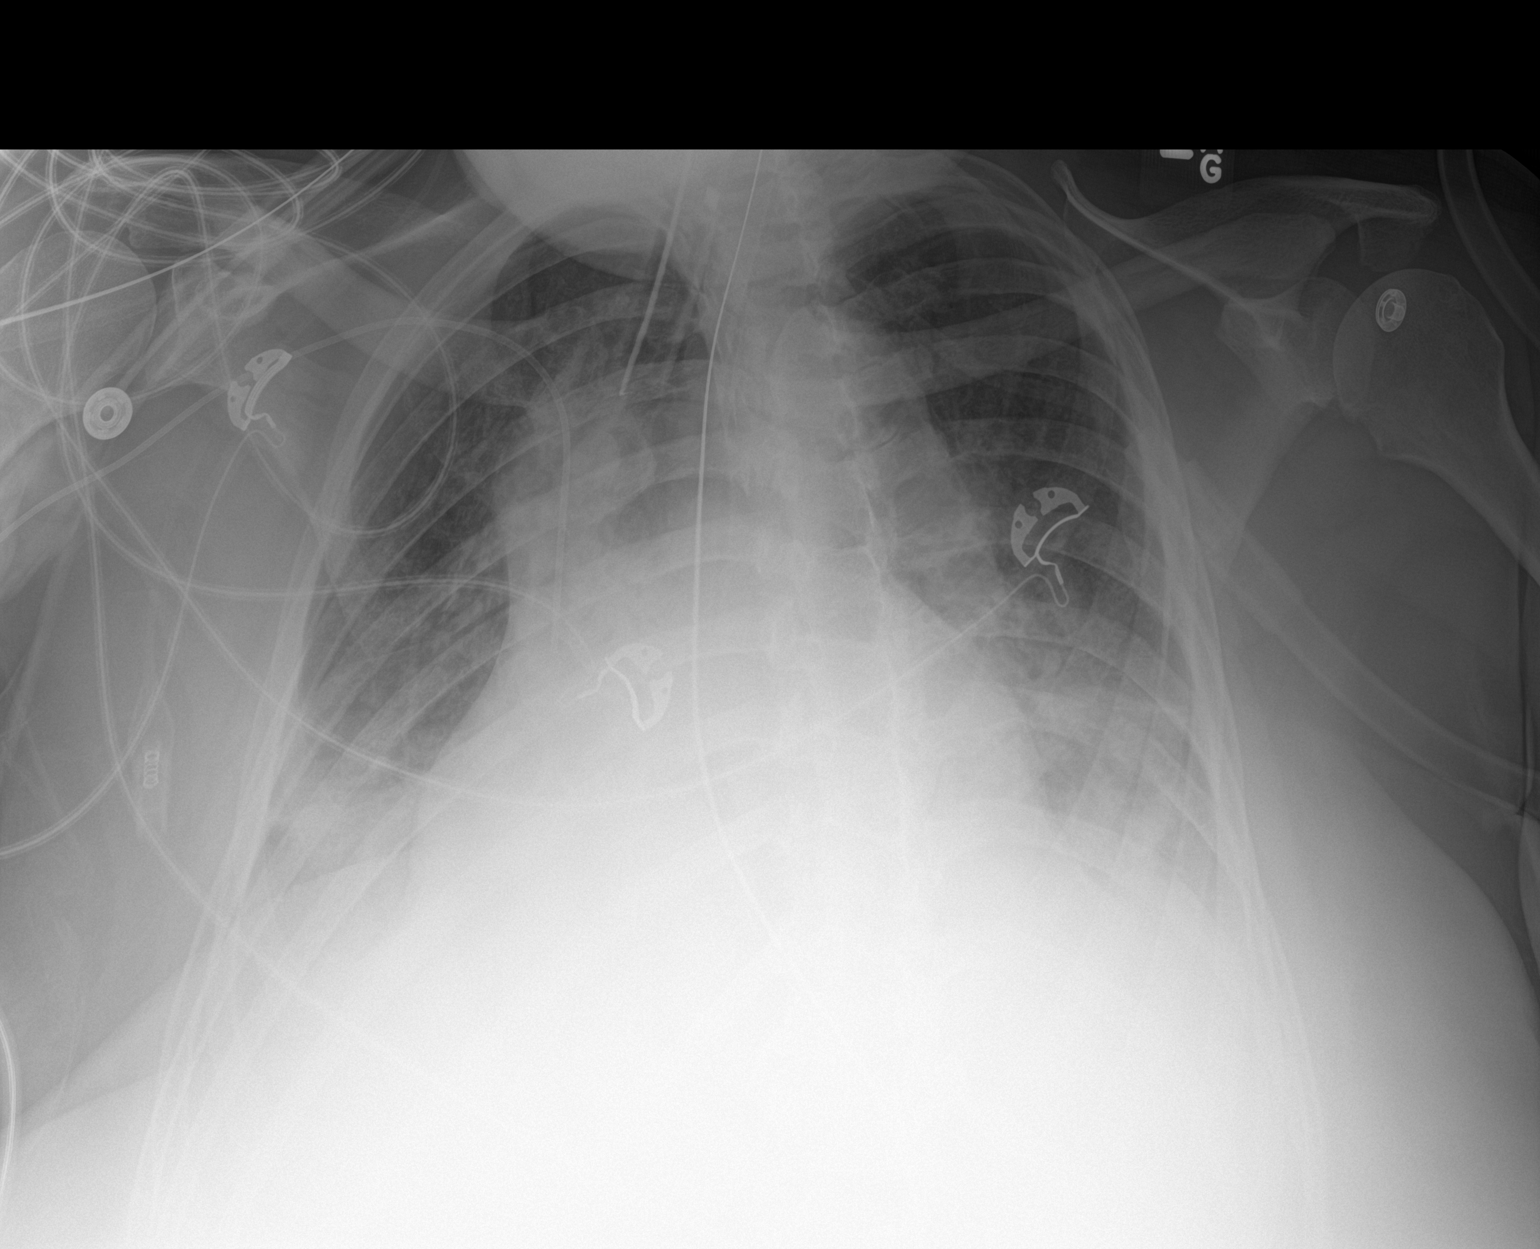

[1 of 1 positions shown; findings below may reference images not displayed]

FINDINGS: Endotracheal tube tip is 2.8 cm above the carina. Central catheter
tip is in the superior vena cava. Nasogastric tube tip and side port
are below the diaphragm. No pneumothorax.

There is airspace consolidation throughout the right lower lobe,
increased. There is patchy infiltrate in the left lower lobe,
stable. There are small pleural effusions bilaterally. There is
cardiomegaly with pulmonary vascularity within normal limits. No
adenopathy. No bone lesions.
IMPRESSION: Tube and catheter positions as described without pneumothorax.
Increase in right lower lobe consolidation. A lesser degree of
consolidation in the left base is stable. Small pleural effusions
bilaterally are stable. Stable cardiomegaly.
